# Patient Record
Sex: Male | Born: 1970 | Race: White | Hispanic: No | Marital: Married | Smoking: Never smoker
Health system: Southern US, Community
[De-identification: ages and names within clinical notes are randomized; demographics above are authoritative.]

## PROBLEM LIST (undated history)

## (undated) DIAGNOSIS — G5603 Carpal tunnel syndrome, bilateral upper limbs: Secondary | ICD-10-CM

## (undated) DIAGNOSIS — K0510 Chronic gingivitis, plaque induced: Secondary | ICD-10-CM

## (undated) DIAGNOSIS — R51 Headache: Secondary | ICD-10-CM

## (undated) DIAGNOSIS — R519 Headache, unspecified: Secondary | ICD-10-CM

## (undated) DIAGNOSIS — E119 Type 2 diabetes mellitus without complications: Secondary | ICD-10-CM

## (undated) HISTORY — DX: Headache: R51

## (undated) HISTORY — PX: MOUTH SURGERY: SHX715

## (undated) HISTORY — DX: Headache, unspecified: R51.9

## (undated) HISTORY — PX: VASECTOMY: SHX75

## (undated) HISTORY — PX: COLONOSCOPY WITH PROPOFOL: SHX5780

## (undated) HISTORY — DX: Type 2 diabetes mellitus without complications: E11.9

## (undated) HISTORY — DX: Chronic gingivitis, plaque induced: K05.10

## (undated) HISTORY — DX: Carpal tunnel syndrome, bilateral upper limbs: G56.03

---

## 2005-05-17 ENCOUNTER — Ambulatory Visit: Payer: Self-pay | Admitting: Family Medicine

## 2005-06-23 ENCOUNTER — Encounter: Payer: Self-pay | Admitting: Orthopedic Surgery

## 2006-10-28 ENCOUNTER — Ambulatory Visit: Payer: Self-pay | Admitting: Orthopaedic Surgery

## 2006-12-15 ENCOUNTER — Ambulatory Visit: Payer: Self-pay | Admitting: Gastroenterology

## 2016-01-27 ENCOUNTER — Other Ambulatory Visit: Payer: Self-pay | Admitting: Chiropractor

## 2016-01-27 DIAGNOSIS — M9901 Segmental and somatic dysfunction of cervical region: Secondary | ICD-10-CM

## 2016-01-27 DIAGNOSIS — M542 Cervicalgia: Secondary | ICD-10-CM

## 2016-01-27 DIAGNOSIS — M5413 Radiculopathy, cervicothoracic region: Secondary | ICD-10-CM

## 2016-01-27 DIAGNOSIS — G540 Brachial plexus disorders: Secondary | ICD-10-CM

## 2016-01-27 DIAGNOSIS — S161XXS Strain of muscle, fascia and tendon at neck level, sequela: Secondary | ICD-10-CM

## 2016-02-03 ENCOUNTER — Ambulatory Visit
Admission: RE | Admit: 2016-02-03 | Discharge: 2016-02-03 | Disposition: A | Discharge status: Home / Self Care | Payer: Managed Care, Other (non HMO) | Source: Ambulatory Visit | Attending: Chiropractor | Admitting: Chiropractor

## 2016-02-03 DIAGNOSIS — M9901 Segmental and somatic dysfunction of cervical region: Secondary | ICD-10-CM

## 2016-02-03 DIAGNOSIS — M5413 Radiculopathy, cervicothoracic region: Secondary | ICD-10-CM

## 2016-02-03 DIAGNOSIS — M542 Cervicalgia: Secondary | ICD-10-CM

## 2016-02-03 DIAGNOSIS — G540 Brachial plexus disorders: Secondary | ICD-10-CM

## 2016-02-03 DIAGNOSIS — S161XXS Strain of muscle, fascia and tendon at neck level, sequela: Secondary | ICD-10-CM

## 2016-02-03 MED ORDER — GADOBENATE DIMEGLUMINE 529 MG/ML IV SOLN: 20.0000 mL | Freq: Once | INTRAVENOUS | Status: DC | PRN

## 2016-03-10 ENCOUNTER — Ambulatory Visit (INDEPENDENT_AMBULATORY_CARE_PROVIDER_SITE_OTHER): Payer: Managed Care, Other (non HMO) | Admitting: Neurology

## 2016-03-10 ENCOUNTER — Telehealth: Payer: Self-pay | Admitting: Neurology

## 2016-03-10 ENCOUNTER — Encounter: Payer: Self-pay | Admitting: Neurology

## 2016-03-10 VITALS — BP 137/83 | HR 89 | Ht 72.0 in | Wt 229.0 lb

## 2016-03-10 DIAGNOSIS — G8929 Other chronic pain: Secondary | ICD-10-CM

## 2016-03-10 DIAGNOSIS — R202 Paresthesia of skin: Secondary | ICD-10-CM | POA: Diagnosis not present

## 2016-03-10 DIAGNOSIS — M25511 Pain in right shoulder: Principal | ICD-10-CM

## 2016-03-10 MED ORDER — NORTRIPTYLINE HCL 10 MG PO CAPS: ORAL_CAPSULE | ORAL | 3 refills | Status: DC
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
## 2016-03-10 NOTE — Telephone Encounter (Signed)
I will make the referral for physical therapy to [facility].

## 2016-03-10 NOTE — Telephone Encounter (Signed)
Pt wants rehab in [place]

## 2016-03-10 NOTE — Patient Instructions (Signed)
   We will start physical therapy on the right shoulder and start nortriptyline as a muscle relaxant. We will get EMG and NCV evaluation to look at the nerve function of the right arm.  Pamelor (nortriptyline) is an antidepressant medication that has many uses that may include headache, whiplash injuries, or for peripheral neuropathy pain. Side effects may include drowsiness, dry mouth, blurred vision, or constipation. As with any antidepressant medication, worsening depression may occur. If you had any significant side effects, please call our office. The full effects of this medication may take 7-10 days after starting the drug, or going up on the dose.

## 2016-04-06 ENCOUNTER — Ambulatory Visit: Payer: Managed Care, Other (non HMO) | Admitting: Physical Therapy

## 2016-04-08 ENCOUNTER — Ambulatory Visit: Payer: Managed Care, Other (non HMO) | Admitting: Physical Therapy

## 2016-04-12 ENCOUNTER — Encounter: Payer: Managed Care, Other (non HMO) | Admitting: Physical Therapy

## 2016-04-15 ENCOUNTER — Encounter: Payer: Managed Care, Other (non HMO) | Admitting: Physical Therapy

## 2016-04-19 ENCOUNTER — Encounter: Payer: Managed Care, Other (non HMO) | Admitting: Physical Therapy

## 2016-04-19 ENCOUNTER — Telehealth: Payer: Self-pay | Admitting: *Deleted
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
## 2016-04-20 ENCOUNTER — Encounter: Payer: Managed Care, Other (non HMO) | Admitting: Neurology

## 2016-04-26 ENCOUNTER — Ambulatory Visit: Payer: Managed Care, Other (non HMO) | Attending: Neurology | Admitting: Physical Therapy

## 2016-04-29 ENCOUNTER — Ambulatory Visit: Payer: Managed Care, Other (non HMO) | Admitting: Physical Therapy

## 2016-05-03 ENCOUNTER — Encounter: Payer: Managed Care, Other (non HMO) | Admitting: Physical Therapy

## 2016-05-06 ENCOUNTER — Encounter: Payer: Managed Care, Other (non HMO) | Admitting: Physical Therapy

## 2016-05-18 ENCOUNTER — Encounter (INDEPENDENT_AMBULATORY_CARE_PROVIDER_SITE_OTHER): Payer: Self-pay

## 2016-05-18 ENCOUNTER — Ambulatory Visit (INDEPENDENT_AMBULATORY_CARE_PROVIDER_SITE_OTHER): Payer: Self-pay | Admitting: Neurology

## 2016-05-18 ENCOUNTER — Ambulatory Visit (INDEPENDENT_AMBULATORY_CARE_PROVIDER_SITE_OTHER): Payer: 59 | Admitting: Neurology

## 2016-05-18 ENCOUNTER — Encounter: Payer: Self-pay | Admitting: Neurology

## 2016-05-18 DIAGNOSIS — R202 Paresthesia of skin: Secondary | ICD-10-CM

## 2016-05-18 DIAGNOSIS — M25511 Pain in right shoulder: Secondary | ICD-10-CM

## 2016-05-18 DIAGNOSIS — G8929 Other chronic pain: Secondary | ICD-10-CM

## 2016-05-18 DIAGNOSIS — G5603 Carpal tunnel syndrome, bilateral upper limbs: Secondary | ICD-10-CM

## 2016-05-18 HISTORY — DX: Carpal tunnel syndrome, bilateral upper limbs: G56.03

## 2016-05-18 NOTE — Progress Notes (Signed)
Please refer to EMG and nerve conduction study procedure note. 

## 2016-05-18 NOTE — Progress Notes (Signed)
The patient comes in today for EMG nerve conduction study evaluation which shows evidence of bilateral mild carpal tunnel syndrome. EMG on the right arm does not show evidence of a cervical radiculopathy.  The patient indicates that the muscle back since do help the pain, he sleeps better. He has not gotten into physical therapy has an appointment was never set up.  Physical therapy for [facility] was re-referred.  The patient will follow-up in 3-4 months. He will use wrist splints for the carpal tunnel syndrome bilaterally. Most of his discomfort is in the interscapular area on the right.
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
## 2016-08-06 ENCOUNTER — Other Ambulatory Visit: Payer: Self-pay | Admitting: Neurology

## 2017-03-04 DIAGNOSIS — K136 Irritative hyperplasia of oral mucosa: Secondary | ICD-10-CM | POA: Diagnosis not present

## 2017-11-17 ENCOUNTER — Emergency Department: Payer: 59

## 2017-11-17 ENCOUNTER — Emergency Department
Admission: EM | Admit: 2017-11-17 | Discharge: 2017-11-17 | Disposition: A | Discharge status: Home / Self Care | Payer: 59 | Attending: Emergency Medicine | Admitting: Emergency Medicine

## 2017-11-17 DIAGNOSIS — E119 Type 2 diabetes mellitus without complications: Secondary | ICD-10-CM | POA: Diagnosis not present

## 2017-11-17 DIAGNOSIS — Z79899 Other long term (current) drug therapy: Secondary | ICD-10-CM | POA: Insufficient documentation

## 2017-11-17 DIAGNOSIS — R51 Headache: Secondary | ICD-10-CM | POA: Diagnosis not present

## 2017-11-17 DIAGNOSIS — G43801 Other migraine, not intractable, with status migrainosus: Secondary | ICD-10-CM | POA: Diagnosis not present

## 2017-11-17 LAB — BASIC METABOLIC PANEL
ANION GAP: 12 (ref 5–15)
BUN: 11 mg/dL (ref 6–20)
CHLORIDE: 102 mmol/L (ref 98–111)
CO2: 25 mmol/L (ref 22–32)
CREATININE: 0.58 mg/dL — AB (ref 0.61–1.24)
Calcium: 9.3 mg/dL (ref 8.9–10.3)
GFR calc Af Amer: >60 mL/min (ref >60)
GFR calc non Af Amer: >60 mL/min (ref >60)
Glucose, Bld: 334 mg/dL — ABNORMAL HIGH (ref 70–99)
Potassium: 4.7 mmol/L (ref 3.5–5.1)
SODIUM: 139 mmol/L (ref 135–145)

## 2017-11-17 LAB — CBC WITH DIFFERENTIAL/PLATELET
ABS IMMATURE GRANULOCYTES: 0.01 10*3/uL (ref 0.00–0.07)
Basophils Absolute: 0 10*3/uL (ref 0.0–0.1)
Basophils Relative: 0 %
Eosinophils Absolute: 0.1 10*3/uL (ref 0.0–0.5)
Eosinophils Relative: 1 %
HEMATOCRIT: 51 % (ref 39.0–52.0)
Hemoglobin: 17.1 g/dL — ABNORMAL HIGH (ref 13.0–17.0)
Immature Granulocytes: 0 %
Lymphocytes Relative: 29 %
Lymphs Abs: 2.1 10*3/uL (ref 0.7–4.0)
MCH: 28.6 pg (ref 26.0–34.0)
MCHC: 33.5 g/dL (ref 30.0–36.0)
MCV: 85.4 fL (ref 80.0–100.0)
MONO ABS: 0.4 10*3/uL (ref 0.1–1.0)
Monocytes Relative: 6 %
NEUTROS ABS: 4.4 10*3/uL (ref 1.7–7.7)
NEUTROS PCT: 64 %
Platelets: 229 10*3/uL (ref 150–400)
RBC: 5.97 MIL/uL — ABNORMAL HIGH (ref 4.22–5.81)
RDW: 11.9 % (ref 11.5–15.5)
WBC: 7 10*3/uL (ref 4.0–10.5)
nRBC: 0 % (ref 0.0–0.2)

## 2017-11-17 MED ORDER — KETOROLAC TROMETHAMINE 30 MG/ML IJ SOLN
15.0000 mg | INTRAMUSCULAR | Status: AC
  Administered 2017-11-17: 15 mg via INTRAVENOUS
  Filled 2017-11-17: qty 1

## 2017-11-17 MED ORDER — DIPHENHYDRAMINE HCL 25 MG PO CAPS: 50.0000 mg | ORAL_CAPSULE | Freq: Four times a day (QID) | ORAL | 0 refills | Status: DC | PRN

## 2017-11-17 MED ORDER — SODIUM CHLORIDE 0.9 % IV BOLUS
1000.0000 mL | Freq: Once | INTRAVENOUS | Status: AC
  Administered 2017-11-17: 1000 mL via INTRAVENOUS

## 2017-11-17 MED ORDER — METOCLOPRAMIDE HCL 10 MG PO TABS: 10.0000 mg | ORAL_TABLET | Freq: Four times a day (QID) | ORAL | 0 refills | Status: DC | PRN

## 2017-11-17 MED ORDER — METOCLOPRAMIDE HCL 5 MG/ML IJ SOLN
10.0000 mg | Freq: Once | INTRAMUSCULAR | Status: AC
  Administered 2017-11-17: 10 mg via INTRAVENOUS
  Filled 2017-11-17: qty 2

## 2017-11-17 MED ORDER — KETOROLAC TROMETHAMINE 10 MG PO TABS: 10.0000 mg | ORAL_TABLET | Freq: Four times a day (QID) | ORAL | 0 refills | Status: DC | PRN

## 2017-11-17 MED ORDER — DIPHENHYDRAMINE HCL 50 MG/ML IJ SOLN
50.0000 mg | Freq: Once | INTRAMUSCULAR | Status: AC
  Administered 2017-11-17: 50 mg via INTRAVENOUS
  Filled 2017-11-17: qty 1

## 2017-11-17 NOTE — ED Provider Notes (Addendum)
[facility] Emergency Department Provider Note  ____________________________________________  Time seen: Approximately 10:55 AM  I have reviewed the triage vital signs and the nursing notes.   HISTORY  Chief Complaint Headache    HPI Bruce Brandt is a 47 y.o. male complains of headache which is severe, generalized.  Nonradiating, throbbing, associated light sensitivity.  No alleviating factors.  Not positional.  No recent trauma.  Reports that the headache started gradually after he has been taking decongestants for URI/sinus pressure over the past 2 weeks.  He has had multiple sick family members with similar symptoms.  He started having a bilateral frontal headache, and then over the past week this is generalized.  Rates it as severe.  Associated with vomiting as well.      Past Medical History:  Diagnosis Date  . Carpal tunnel syndrome, bilateral 05/18/2016  . Diabetes (HCC)   Diet-controlled diabetes   Patient Active Problem List   Diagnosis Date Noted  . Carpal tunnel syndrome, bilateral 05/18/2016  . Paresthesia 03/10/2016  . Chronic right shoulder pain 03/10/2016     Past Surgical History:  Procedure Laterality Date  . VASECTOMY       Prior to Admission medications   Medication Sig Start Date End Date Taking? Authorizing Provider  diphenhydrAMINE (BENADRYL) 25 mg capsule Take 2 capsules (50 mg total) by mouth every 6 (six) hours as needed. 11/17/17   Sharman Cheek, MD  ketorolac (TORADOL) 10 MG tablet Take 1 tablet (10 mg total) by mouth every 6 (six) hours as needed for moderate pain. 11/17/17   Sharman Cheek, MD  metoCLOPramide (REGLAN) 10 MG tablet Take 1 tablet (10 mg total) by mouth every 6 (six) hours as needed. 11/17/17   Sharman Cheek, MD  Multiple Vitamins-Minerals (MULTIVITAMIN ADULT PO) Take 1 tablet by mouth daily.    [provider]  Naproxen Sod-Diphenhydramine (ALEVE PM) 220-25 MG TABS Take 2 tablets by  mouth at bedtime as needed.    [provider]  nortriptyline (PAMELOR) 10 MG capsule Take 3 capsules (30 mg total) by mouth at bedtime. 08/06/16   York Spaniel, MD     Allergies Patient has no known allergies.   Family History  Problem Relation Age of Onset  . Heart disease Father   . Diabetes Maternal Grandmother   . Diabetes Paternal Grandmother     Social History Social History   Tobacco Use  . Smoking status: Never Smoker  . Smokeless tobacco: Never Used  Substance Use Topics  . Alcohol use: Yes    Comment: Occasional  . Drug use: No    Review of Systems  Constitutional:   No fever or chills.  ENT:   No sore throat.  Positive rhinorrhea and congestion Cardiovascular:   No chest pain or syncope. Respiratory:   No dyspnea or cough. Gastrointestinal:   Negative for abdominal pain, vomiting and diarrhea.  Musculoskeletal:   Negative for focal pain or swelling All other systems reviewed and are negative except as documented above in ROS and HPI.  ____________________________________________   PHYSICAL EXAM:  VITAL SIGNS: ED Triage Vitals  Enc Vitals Group     BP 11/17/17 0849 128/85     Pulse Rate 11/17/17 0849 88     Resp 11/17/17 0849 16     Temp 11/17/17 0849 98.8 F (37.1 C)     Temp Source 11/17/17 0849 Oral     SpO2 11/17/17 0849 95 %     Weight 11/17/17 0848  220 lb (99.8 kg)     Height 11/17/17 0848 6' (1.829 m)     Head Circumference --      Peak Flow --      Pain Score 11/17/17 0849 10     Pain Loc --      Pain Edu? --      Excl. in GC? --     Vital signs reviewed, nursing assessments reviewed.   Constitutional:   Alert and oriented. Non-toxic appearance. Eyes:   Conjunctivae are normal. EOMI. PERRL.  Appropriate concentric reflex ENT      Head:   Normocephalic and atraumatic.      Nose:   Nasal congestion without rhinorrhea.  No pain on percussion of sinuses      Mouth/Throat:   Dry mucous membranes, no pharyngeal erythema.  No peritonsillar mass.       Neck:   No meningismus. Full ROM. Hematological/Lymphatic/Immunilogical:   No cervical lymphadenopathy. Cardiovascular:   RRR. Symmetric bilateral radial and DP pulses.  No murmurs. Cap refill less than 2 seconds. Respiratory:   Normal respiratory effort without tachypnea/retractions. Breath sounds are clear and equal bilaterally. No wheezes/rales/rhonchi. Gastrointestinal:   Soft and nontender. Non distended. There is no CVA tenderness.  No rebound, rigidity, or guarding. Musculoskeletal:   Normal range of motion in all extremities. No joint effusions.  No lower extremity tenderness.  No edema. Neurologic:   Normal speech and language.  Motor grossly intact. No acute focal neurologic deficits are appreciated.  Skin:    Skin is warm, dry and intact. No rash noted.  No petechiae, purpura, or bullae.  ____________________________________________    LABS (pertinent positives/negatives) (all labs ordered are listed, but only abnormal results are displayed) Labs Reviewed  BASIC METABOLIC PANEL - Abnormal; Notable for the following components:      Result Value   Glucose, Bld 334 (*)    Creatinine, Ser 0.58 (*)    All other components within normal limits  CBC WITH DIFFERENTIAL/PLATELET - Abnormal; Notable for the following components:   RBC 5.97 (*)    Hemoglobin 17.1 (*)    All other components within normal limits   ____________________________________________   EKG    ____________________________________________    RADIOLOGY  Ct Head Wo Contrast  Result Date: 11/17/2017 CLINICAL DATA:  Worst headache of life starting yesterday EXAM: CT HEAD WITHOUT CONTRAST TECHNIQUE: Contiguous axial images were obtained from the base of the skull through the vertex without intravenous contrast. COMPARISON:  None. FINDINGS: Brain: No evidence of acute infarction, hemorrhage, hydrocephalus, extra-axial collection or mass lesion/mass effect. Vascular: No  hyperdense vessel or unexpected calcification. Skull: Normal. Negative for fracture or focal lesion. Sinuses/Orbits: No acute finding. IMPRESSION: Negative head CT. Electronically Signed   By: Marnee Spring M.D.   On: 11/17/2017 10:15    ____________________________________________   PROCEDURES Procedures  ____________________________________________  DIFFERENTIAL DIAGNOSIS   Intracranial mass, dehydration, migraine, viral syndrome, tension headache  CLINICAL IMPRESSION / ASSESSMENT AND PLAN / ED COURSE  Pertinent labs & imaging results that were available during my care of the patient were reviewed by me and considered in my medical decision making (see chart for details).      Clinical Course as of Nov 27 1522  Thu Nov 17, 2017  1054 Patient with subacute headache, gradual onset but severe.  Clinically consistent with migraine but no prior history.  Given migraine cocktail and IV fluids.  Labs do suggest hemoconcentration and dehydration.  CT head without contrast is negative.  I doubt cerebral aneurysm or intracranial hemorrhage.  No evidence of meningitis encephalitis glaucoma or arterial dissection or occlusion.  Doubt ischemic stroke.  If he is feeling better think is stable for discharge home and outpatient follow-up.   [PS]  1306 Patient feels slightly better, still with significant headache and photophobia.  Offered additional treatment here in the ED and medications to control his symptoms but patient declines and wishes to go home now..  Vital signs remain normal.  Stable for discharge home.   [PS]    Clinical Course User Index [PS] Sharman Cheek, MD     ____________________________________________   FINAL CLINICAL IMPRESSION(S) / ED DIAGNOSES    Final diagnoses:  Other migraine with status migrainosus, not intractable     ED Discharge Orders         Ordered    ketorolac (TORADOL) 10 MG tablet  Every 6 hours PRN     11/17/17 1315    metoCLOPramide  (REGLAN) 10 MG tablet  Every 6 hours PRN     11/17/17 1315    diphenhydrAMINE (BENADRYL) 25 mg capsule  Every 6 hours PRN     11/17/17 1315          Portions of this note were generated with dragon dictation software. Dictation errors may occur despite best attempts at proofreading.    Sharman Cheek, MD 11/17/17 1316    Sharman Cheek, MD 11/26/17 1524

## 2017-11-17 NOTE — ED Notes (Signed)
Pt given water 

## 2017-11-17 NOTE — Discharge Instructions (Signed)
Your CT scan of the head and lab tests today were okay.  Continue taking medications as needed to control your headache and follow-up with primary care for continued monitoring of your symptoms.

## 2017-11-17 NOTE — ED Triage Notes (Signed)
Pt reports worst headache of his life. Started yesterday in back of head. Reports sensitivity to light. No history of migraines

## 2017-11-17 NOTE — ED Notes (Signed)
Family to bedside, asking for update, Scotty Court MD aware

## 2017-12-02 DIAGNOSIS — Z049 Encounter for examination and observation for unspecified reason: Secondary | ICD-10-CM | POA: Diagnosis not present

## 2017-12-02 DIAGNOSIS — G4452 New daily persistent headache (NDPH): Secondary | ICD-10-CM | POA: Diagnosis not present

## 2017-12-02 DIAGNOSIS — R51 Headache: Secondary | ICD-10-CM | POA: Diagnosis not present

## 2017-12-02 DIAGNOSIS — Z79899 Other long term (current) drug therapy: Secondary | ICD-10-CM | POA: Diagnosis not present

## 2017-12-03 ENCOUNTER — Other Ambulatory Visit: Payer: Self-pay | Admitting: Specialist

## 2017-12-03 DIAGNOSIS — R51 Headache: Principal | ICD-10-CM

## 2017-12-03 DIAGNOSIS — R519 Headache, unspecified: Secondary | ICD-10-CM

## 2017-12-06 DIAGNOSIS — G4452 New daily persistent headache (NDPH): Secondary | ICD-10-CM | POA: Diagnosis not present

## 2017-12-07 ENCOUNTER — Ambulatory Visit
Admission: RE | Admit: 2017-12-07 | Discharge: 2017-12-07 | Disposition: A | Discharge status: Home / Self Care | Payer: 59 | Source: Ambulatory Visit | Attending: Specialist | Admitting: Specialist

## 2017-12-07 DIAGNOSIS — R519 Headache, unspecified: Secondary | ICD-10-CM

## 2017-12-07 DIAGNOSIS — R51 Headache: Principal | ICD-10-CM

## 2017-12-13 DIAGNOSIS — R51 Headache: Secondary | ICD-10-CM | POA: Diagnosis not present

## 2017-12-28 DIAGNOSIS — G4452 New daily persistent headache (NDPH): Secondary | ICD-10-CM | POA: Diagnosis not present

## 2017-12-29 ENCOUNTER — Ambulatory Visit: Payer: Self-pay | Admitting: Nurse Practitioner

## 2018-01-03 ENCOUNTER — Other Ambulatory Visit: Payer: Self-pay

## 2018-01-03 ENCOUNTER — Ambulatory Visit (INDEPENDENT_AMBULATORY_CARE_PROVIDER_SITE_OTHER): Payer: 59 | Admitting: Nurse Practitioner

## 2018-01-03 ENCOUNTER — Encounter: Payer: Self-pay | Admitting: Nurse Practitioner

## 2018-01-03 VITALS — BP 111/76 | HR 88 | Temp 98.0°F | Ht 72.0 in | Wt 225.8 lb

## 2018-01-03 DIAGNOSIS — E1142 Type 2 diabetes mellitus with diabetic polyneuropathy: Secondary | ICD-10-CM | POA: Insufficient documentation

## 2018-01-03 DIAGNOSIS — E1165 Type 2 diabetes mellitus with hyperglycemia: Secondary | ICD-10-CM | POA: Insufficient documentation

## 2018-01-03 DIAGNOSIS — Z23 Encounter for immunization: Secondary | ICD-10-CM

## 2018-01-03 DIAGNOSIS — Z7689 Persons encountering health services in other specified circumstances: Secondary | ICD-10-CM

## 2018-01-03 DIAGNOSIS — R519 Headache, unspecified: Secondary | ICD-10-CM | POA: Insufficient documentation

## 2018-01-03 DIAGNOSIS — R51 Headache: Secondary | ICD-10-CM

## 2018-01-03 LAB — POCT GLYCOSYLATED HEMOGLOBIN (HGB A1C): Hemoglobin A1C: 13.4 % — AB (ref 4.0–5.6)

## 2018-01-03 MED ORDER — METFORMIN HCL 500 MG PO TABS: 500.0000 mg | ORAL_TABLET | Freq: Two times a day (BID) | ORAL | 0 refills | Status: DC

## 2018-01-03 NOTE — Progress Notes (Signed)
Subjective:    Patient ID: Bruce Brandt, male    DOB: [birth year], 47 y.o.   MRN: [id number]  Bruce LoboBobby L Naim is a 47 y.o. male presenting on 01/03/2018 for Establish Care (persistent headache x 8 weeks. [facility] w/ no relief) and Diabetes   HPI Establish Care New Provider Pt last seen by PCP Dr. Carman [facility]ingMoffet at [facility] at least 6-7 years ago.    Diabetes Was previously treated by Dr. Carman ChingMoffet and endocrinology has not had care since 2007.  Is not currently on any medications for DM.  He had lost about 100 lbs and has not recently had any followup. - Pt presents today for follow up of Type 2 diabetes mellitus. He is not checking CBG at home - Current diabetic medications include: none - he self-stopped his medications several years ago - He is symptomatic with headache, polyuria, polyphagia, paresthesias (BLE pain at night).  - He denies polydipsia, diaphoresis, shakiness, chills and changes in vision.   - Clinical course has been unmanaged. - He  reports an exercise routine that includes work as a Surveyor, mineralscontractor, 5 days per week. - His diet is moderate in salt, high in fat, and high in carbohydrates. - Weight trend: stable (is down 75-100 lbs from prior high weight).  Recent Labs    01/03/18 0853  HGBA1C 13.4*     Persistent headache Patient has had workup with headache center and ENT without relief. Most recently had migraine on 11/17/2017 that required emergency room visit for treatment. CT scan was negative, IV toradol without significant relief.  "Constant headache."  MRI and blood tests at [facility] [place] [address] without any answers.  No sinus issues ID'd with ENT.  Headache specialist ordered gabapentin, but patient has not started yet.  States he wants fewest meds possible.  Past Medical History:  Diagnosis Date  . Carpal tunnel syndrome, bilateral 05/18/2016  . Diabetes (HCC)   . Frequent headaches   . Gingivitis     Past Surgical History:  Procedure Laterality Date  . MOUTH SURGERY     gingival repair  . VASECTOMY     Social History   Socioeconomic History  . Marital status: Married    Spouse name: Not on file  . Number of children: 2  . Years of education: 4712  . Highest education level: High school graduate  Occupational History  . Occupation: Music therapistCarpenter    Comment: Self-employed  Social Needs  . Financial resource strain: Not hard at all  . Food insecurity:    Worry: Never true    Inability: Never true  . Transportation needs:    Medical: No    Non-medical: No  Tobacco Use  . Smoking status: Never Smoker  . Smokeless tobacco: Never Used  Substance and Sexual Activity  . Alcohol use: Not Currently    Frequency: Never    Comment: > 2 years ago had rare alcohol intake  . Drug use: No  . Sexual activity: Yes    Partners: Female    Comment: Married  Lifestyle  . Physical activity:    Days per week: 5 days    Minutes per session: 50 min  . Stress: Only a little  Relationships  . Social connections:    Talks on phone: Twice a week    Gets together: Twice a week    Attends religious service: More than 4 times per year    Active member  of club or organization: Yes    Attends meetings of clubs or organizations: More than 4 times per year    Relationship status: Married  . Intimate partner violence:    Fear of current or ex partner: No    Emotionally abused: No    Physically abused: No    Forced sexual activity: No  Other Topics Concern  . Not on file  Social History Narrative   Lives at home w/ his wife   Right-handed   Caffeine: three 16 oz drinks daily   Family History  Problem Relation Age of Onset  . Heart disease Father   . Diabetes Maternal Grandmother   . Diabetes Paternal Grandmother    Current Outpatient Medications on File Prior to Visit  Medication Sig  . aspirin EC 81 MG tablet Take 81 mg by mouth daily.  . Cinnamon 500 MG capsule Take 500 mg by mouth  daily.  . Misc Natural Products (PROSTATE HEALTH PO) Take by mouth.  . Multiple Vitamins-Minerals (MULTIVITAMIN ADULT PO) Take 1 tablet by mouth daily.  . Nutritional Supplements (JUICE PLUS FIBRE) LIQD Take by mouth.  . gabapentin (NEURONTIN) 300 MG capsule   . Naproxen Sod-Diphenhydramine (ALEVE PM) 220-25 MG TABS Take 2 tablets by mouth at bedtime as needed.   No current facility-administered medications on file prior to visit.     Review of Systems  Constitutional: Negative for activity change, appetite change, fatigue and unexpected weight change.  HENT: Negative for congestion, hearing loss and trouble swallowing.   Eyes: Negative for visual disturbance.  Respiratory: Negative for choking, shortness of breath and wheezing.   Cardiovascular: Negative for chest pain and palpitations.  Gastrointestinal: Negative for abdominal pain, blood in stool, constipation and diarrhea.  Genitourinary: Positive for frequency. Negative for difficulty urinating.       Erectile dysfunction  Musculoskeletal: Negative for arthralgias, back pain and myalgias.       Neuropathies  Skin: Negative for color change, rash and wound.  Allergic/Immunologic: Negative for environmental allergies.  Neurological: Positive for headaches. Negative for dizziness, seizures and weakness.  Psychiatric/Behavioral: Negative for behavioral problems, decreased concentration, dysphoric mood, sleep disturbance and suicidal ideas. The patient is not nervous/anxious.    Per HPI unless specifically indicated above     Objective:    BP 111/76 (BP Location: Right Arm, Patient Position: Sitting, Cuff Size: Normal)   Pulse 88   Temp 98 F (36.7 C) (Oral)   Ht 6' (1.829 m)   Wt 225 lb 12.8 oz (102.4 kg)   BMI 30.62 kg/m   Wt Readings from Last 3 Encounters:  01/03/18 225 lb 12.8 oz (102.4 kg)  11/17/17 220 lb (99.8 kg)  03/10/16 229 lb (103.9 kg)    Physical Exam  Constitutional: He is oriented to person, place, and  time. He appears well-developed and well-nourished. No distress.  HENT:  Head: Normocephalic and atraumatic.  Neck: Normal range of motion. Neck supple. Carotid bruit is not present.  Cardiovascular: Normal rate, regular rhythm, S1 normal, S2 normal, normal heart sounds and intact distal pulses.  Pulmonary/Chest: Effort normal and breath sounds normal. No respiratory distress.  Abdominal: Soft. Bowel sounds are normal. He exhibits no distension. There is no hepatosplenomegaly. There is no tenderness. No hernia.  Musculoskeletal: He exhibits no edema (pedal).  Neurological: He is alert and oriented to person, place, and time.  Skin: Skin is warm and dry. Capillary refill takes less than 2 seconds.  Psychiatric: He has a normal mood and  affect. His behavior is normal. Judgment and thought content normal.  Vitals reviewed.  Results for orders placed or performed in visit on 01/03/18  POCT glycosylated hemoglobin (Hb A1C)  Result Value Ref Range   Hemoglobin A1C 13.4 (A) 4.0 - 5.6 %   HbA1c POC (<> result, manual entry)     HbA1c, POC (prediabetic range)     HbA1c, POC (controlled diabetic range)        Assessment & Plan:   Problem List Items Addressed This Visit      Endocrine   Diabetic peripheral neuropathy associated with type 2 diabetes mellitus (HCC) Chronic, worsens at night.  Recommended patient start gabapentin as recommended by headache specialist for this problem as well.  Follow-up 6 weeks prn.   Relevant Medications   gabapentin (NEURONTIN) 300 MG capsule   aspirin EC 81 MG tablet   metFORMIN (GLUCOPHAGE) 500 MG tablet   Type 2 diabetes mellitus with hyperglycemia, without long-term current use of insulin (HCC) - Primary Uncontrolled DM with A1c > 13%  and goal A1c < 7.0%.  Patient endorses regular headache that may be 2/2 hyperglycemia, ED 2/2 vascular changes from DM. - Complications - peripheral neuropathy and hyperglycemia.  Plan:  1. START metformin 500 mg po daily  w breakfast for 7 days.  Then, increase to 500 mg po daily w breakfast and supper and continue. Likely will increase to 1,000 mg bid at next visit. - Prefer to start Ace/ARB and statin, but patient refuses at this time. - Also, patient likely to need 2nd agent in future for glycemic control - will consider SGLT2 inhibitor vs GLP1 2. Encourage improved lifestyle: - low carb/low glycemic diet handout provided - Increase physical activity to 30 minutes most days of the week.  Explained that increased physical activity increases body's use of sugar for energy.  Add activity after dinner for better postprandial control. 3. Check fasting am CBG and bring log to next visit for review. Patient has meter, needs strip refill.  Will call clinic for strip refill request with brand name. 4. Advised to schedule DM ophtho exam, send record.  5. Patient has adequate knowledge base, little activation today exhibited for changing course of care for DM management.  Educated patient today about risks of uncontrolled DM including, but not limited to MI, CVA, vision changes, kidney damage, wounds/amputation, vascular diseases.   6. Follow-up 6 weeks    Relevant Medications   aspirin EC 81 MG tablet   metFORMIN (GLUCOPHAGE) 500 MG tablet   Other Relevant Orders   POCT glycosylated hemoglobin (Hb A1C) (Completed)     Other   Persistent headaches Patient with extensive workup for persistent headache and no defined answers.  Was recommended to start gabapentin, but has not started yet.   - Encouraged patient to start gabapentin. Consider change to topiramate if not improved in future.   - Continue follow-up with Headache clinic.   Relevant Medications   gabapentin (NEURONTIN) 300 MG capsule   aspirin EC 81 MG tablet    Other Visit Diagnoses    Encounter to establish care     Previous PCP was at [facility] > 6 years ago.  Records will not be requested. Recent episodic care reviewed in [facility]. Past medical, family,  and surgical history reviewed w/ patient in clinic.     Needs flu shot     Pt < age 63.  Needs annual influenza vaccine.  Plan: 1. Administer Quad flu vaccine.    Relevant  Orders   Flu Vaccine QUAD 36+ mos IM      Meds ordered this encounter  Medications  . metFORMIN (GLUCOPHAGE) 500 MG tablet    Sig: Take 1 tablet (500 mg total) by mouth 2 (two) times daily with a meal.    Dispense:  180 tablet    Refill:  0    Order Specific Question:   Supervising Provider    Answer:   Smitty Cords [[id number]]    Follow up plan: Return in about 6 weeks (around 02/14/2018) for diabetes.  Wilhelmina Mcardle, DNP, AGPCNP-BC Adult Gerontology Primary Care Nurse Practitioner [facility] 01/03/2018, 12:07 PM

## 2018-01-03 NOTE — Patient Instructions (Addendum)
Bruce LoboBobby L Brandt,   Thank you for coming in to clinic today.  1. Metformin - START with 500 mg once daily for 2 weeks.  Then increase to 500 mg with breakfast and dinner and continue.  2. Work on low glycemic diet (handout).  Please schedule a follow-up appointment with Wilhelmina McardleLauren Mahira Petras, AGNP. Return in about 6 weeks (around 02/14/2018) for diabetes.  If you have any other questions or concerns, please feel free to call the clinic or send a message through MyChart. You may also schedule an earlier appointment if necessary.  You will receive a survey after today's visit either digitally by e-mail or paper by [employer] mail. Your experiences and feedback matter to us.  Please respond so we know how we are doing as we provide care for you.   Wilhelmina McardleLauren Jacorian Golaszewski, DNP, AGNP-BC Adult Gerontology Nurse Practitioner [facility]

## 2018-01-04 ENCOUNTER — Other Ambulatory Visit: Payer: Self-pay

## 2018-01-04 DIAGNOSIS — E1165 Type 2 diabetes mellitus with hyperglycemia: Secondary | ICD-10-CM

## 2018-01-04 MED ORDER — METFORMIN HCL 500 MG PO TABS: 500.0000 mg | ORAL_TABLET | Freq: Two times a day (BID) | ORAL | 0 refills | Status: DC

## 2018-02-09 ENCOUNTER — Ambulatory Visit: Payer: 59 | Admitting: Nurse Practitioner

## 2018-02-09 DIAGNOSIS — R51 Headache: Secondary | ICD-10-CM | POA: Diagnosis not present

## 2018-02-09 DIAGNOSIS — G4452 New daily persistent headache (NDPH): Secondary | ICD-10-CM | POA: Diagnosis not present

## 2018-02-09 NOTE — Progress Notes (Signed)
Patient came in today 02/09/18 for a scheduled appointment @ 9:20.  However, the appointment was canceled on 02/06/18 @ 4:14pm by patient per [facility].    I spoke with patient and applogize for the inconvenience and tried to explain to the patient our plan to see him today.  Patient got irate and stated"we owed him $25 dollars and that this is only the second time he has been at this office and this office is horrible."  I was trying to explain to the patient that I had to get approval to move a patient to a no show appointment on the other provider schedule and we would see him at 9:20.  However, the patient would not let me talk and continued raising his voice and he walked out.

## 2018-02-10 ENCOUNTER — Encounter: Payer: Self-pay | Admitting: Nurse Practitioner

## 2018-02-10 ENCOUNTER — Ambulatory Visit (INDEPENDENT_AMBULATORY_CARE_PROVIDER_SITE_OTHER): Payer: 59 | Admitting: Nurse Practitioner

## 2018-02-10 ENCOUNTER — Other Ambulatory Visit: Payer: Self-pay

## 2018-02-10 VITALS — BP 120/74 | HR 95 | Temp 98.5°F | Ht 72.0 in | Wt 222.8 lb

## 2018-02-10 DIAGNOSIS — E1165 Type 2 diabetes mellitus with hyperglycemia: Secondary | ICD-10-CM

## 2018-02-10 LAB — POCT GLYCOSYLATED HEMOGLOBIN (HGB A1C): Hemoglobin A1C: 13.5 % — AB (ref 4.0–5.6)

## 2018-02-10 MED ORDER — DAPAGLIFLOZIN PROPANEDIOL 10 MG PO TABS: 10.0000 mg | ORAL_TABLET | Freq: Every day | ORAL | 5 refills | Status: DC

## 2018-02-10 MED ORDER — METFORMIN HCL ER 500 MG PO TB24: 500.0000 mg | ORAL_TABLET | Freq: Two times a day (BID) | ORAL | 2 refills | Status: DC

## 2018-02-10 MED ORDER — EMPAGLIFLOZIN 25 MG PO TABS: 25.0000 mg | ORAL_TABLET | Freq: Every day | ORAL | 5 refills | Status: DC

## 2018-02-10 MED ORDER — FREESTYLE LIBRE SENSOR SYSTEM MISC: 1.0000 | Freq: Every day | 0 refills | Status: DC

## 2018-02-10 NOTE — Progress Notes (Signed)
Subjective:    Patient ID: Bruce Brandt, male    DOB: 08/11/1970, 48 y.o.   MRN: [id number]  Bruce Brandt is a 48 y.o. male presenting on 02/10/2018 for Diabetes (unable to check blood sugar, glucometer broken. Pt want to discuss getting  FreeStyle Libre sensors. )   HPI Diabetes Pt presents today for follow up of Type 2 diabetes mellitus.  He is not checking CBG at home. He states his old glucometer is not working.  He would like to have CGM if possible.   - Patient has no glucose log and agrees to early A1c testing today. - Current diabetic medications include: metformin - He is symptomatic with currently.  - He denies headaches, diaphoresis, shakiness, chills, pain, numbness or tingling in extremities and changes in vision.   - Clinical course has been unchanged. - Feels constipated with new gabapentin and metformin.  Not sure which med is causing symptoms. - Also feels "run down" more.  Discussed fatigue is side effect of gabapentin. - Only having one urination at night if not drinking milk.  Otherwise is 2-3 times like last visit. - He  reports no regular exercise routine. - His diet is moderate in salt, moderate in fat, and high in carbohydrates. - Weight trend: stable   PREVENTION: Eye exam current (within one year): no Foot exam current (within one year): no Lipid/ASCVD risk reduction - on statin: no -patient refuses Kidney protection - on ace or arb: no- patient refuses Recent Labs    01/03/18 0853 02/10/18 0957  HGBA1C 13.4* 13.5*    Typical diet choices: - Kind breakfast protein squares  - Nut bars (Kind bars) for snacks  - Lunch: He regularly has sandwich with 2 slices bread, and chips for lunch.  Also will have other carb snacks throughout the day and sometimes will have up to 5 carb servings for lunch.  Social History   Tobacco Use  . Smoking status: Never Smoker  . Smokeless tobacco: Never Used  Substance Use Topics  . Alcohol use: Not Currently   Frequency: Never    Comment: > 2 years ago had rare alcohol intake  . Drug use: No    Review of Systems Per HPI unless specifically indicated above     Objective:    BP 120/74 (BP Location: Left Arm, Patient Position: Sitting, Cuff Size: Large)   Pulse 95   Temp 98.5 F (36.9 C) (Oral)   Ht 6' (1.829 m)   Wt 222 lb 12.8 oz (101.1 kg)   BMI 30.22 kg/m   Wt Readings from Last 3 Encounters:  02/10/18 222 lb 12.8 oz (101.1 kg)  01/03/18 225 lb 12.8 oz (102.4 kg)  11/17/17 220 lb (99.8 kg)    Physical Exam Vitals signs reviewed.  Constitutional:      General: He is not in acute distress.    Appearance: He is well-developed.  HENT:     Head: Normocephalic and atraumatic.  Cardiovascular:     Rate and Rhythm: Normal rate and regular rhythm.     Pulses:          Radial pulses are 2+ on the right side and 2+ on the left side.       Posterior tibial pulses are 1+ on the right side and 1+ on the left side.     Heart sounds: Normal heart sounds, S1 normal and S2 normal.  Pulmonary:     Effort: Pulmonary effort is normal. No respiratory distress.  Breath sounds: Normal breath sounds and air entry.  Musculoskeletal:     Right lower leg: No edema.     Left lower leg: No edema.  Skin:    General: Skin is warm and dry.     Capillary Refill: Capillary refill takes less than 2 seconds.  Neurological:     Mental Status: He is alert and oriented to person, place, and time.  Psychiatric:        Attention and Perception: Attention normal.        Mood and Affect: Mood and affect normal.        Behavior: Behavior normal. Behavior is cooperative.        Thought Content: Thought content normal.        Judgment: Judgment normal.      Results for orders placed or performed in visit on 01/03/18  POCT glycosylated hemoglobin (Hb A1C)  Result Value Ref Range   Hemoglobin A1C 13.4 (A) 4.0 - 5.6 %   HbA1c POC (<> result, manual entry)     HbA1c, POC (prediabetic range)     HbA1c, POC  (controlled diabetic range)         Assessment & Plan:   Problem List Items Addressed This Visit      Endocrine   Type 2 diabetes mellitus with hyperglycemia, without long-term current use of insulin (HCC) - Primary    UncontrolledDM with A1c > 13% stable from last check 6 weeks ago and goal A1c < 8.0%. - Complications - peripheral neuropathy and hyperglycemia.  Plan:  1. Change therapy:  - Change to Metformin XR 500 mg bid - START Jardiance 25 mg once daily.  - may consider future basal insulin for patient if unable to impact blood sugar readings once checking regularly at home.  2. Encourage improved lifestyle: - low carb/low glycemic diet reinforced prior education - Increase physical activity to 30 minutes most days of the week.  Explained that increased physical activity increases body's use of sugar for energy. 3. Check fasting am CBG bring log to next visit for review 4. Will continue to encourage starting ACEi and Statin.  Patient refuses at this time. 5. Advised to schedule DM ophtho exam, send record. 6. Follow-up 6 weeks.  Consider endocrinology referral and repeat Lifestyle center classes for DM at next visit.  Patient declines classes at this time.       Relevant Medications   Continuous Blood Gluc Sensor (FREESTYLE LIBRE SENSOR SYSTEM) MISC   metFORMIN (GLUCOPHAGE XR) 500 MG 24 hr tablet   empagliflozin (JARDIANCE) 25 MG TABS tablet   glucose blood (FREESTYLE TEST STRIPS) test strip   Lancet Devices (LANCET DEVICE WITH EJECTOR) MISC   Other Relevant Orders   POCT glycosylated hemoglobin (Hb A1C) (Completed)      Meds ordered this encounter  Medications  . Continuous Blood Gluc Sensor (FREESTYLE LIBRE SENSOR SYSTEM) MISC    Sig: 1 Device by Does not apply route daily.    Dispense:  1 each    Refill:  0    Please provide 3 months #7 sensors    Order Specific Question:   Supervising Provider    Answer:   Smitty CordsKARAMALEGOS, ALEXANDER J [[id number]]  . metFORMIN (GLUCOPHAGE  XR) 500 MG 24 hr tablet    Sig: Take 1 tablet (500 mg total) by mouth 2 (two) times daily.    Dispense:  60 tablet    Refill:  2    Order Specific Question:   Supervising Provider  Answer:   Smitty CordsKARAMALEGOS, ALEXANDER J [[id number]]  . DISCONTD: dapagliflozin propanediol (FARXIGA) 10 MG TABS tablet    Sig: Take 10 mg by mouth daily.    Dispense:  30 tablet    Refill:  5    Order Specific Question:   Supervising Provider    Answer:   Smitty CordsKARAMALEGOS, ALEXANDER J [[id number]]  . empagliflozin (JARDIANCE) 25 MG TABS tablet    Sig: Take 25 mg by mouth daily.    Dispense:  30 tablet    Refill:  5    Order Specific Question:   Supervising Provider    Answer:   Smitty CordsKARAMALEGOS, ALEXANDER J [[id number]]  . glucose blood (FREESTYLE TEST STRIPS) test strip    Sig: Use as instructed to check blood sugar up to two times daily.    Dispense:  50 each    Refill:  12    Order Specific Question:   Supervising Provider    Answer:   Smitty CordsKARAMALEGOS, ALEXANDER J [[id number]]  . Lancet Devices (LANCET DEVICE WITH EJECTOR) MISC    Sig: 1 Device by Does not apply route once for 1 dose.    Dispense:  1 each    Refill:  0    Please also provide accompanying lancets #100 with 4 refills.    Order Specific Question:   Supervising Provider    Answer:   Smitty CordsKARAMALEGOS, ALEXANDER J [[id number]]    Follow up plan: Return in about 6 weeks (around 03/24/2018) for diabetes.  A total of  43 minutes was spent face-to-face with this patient. Greater than 50% of this time was spent in counseling and coordination of care with the patient for DM management, diet modification, medications as above in AP.    Wilhelmina McardleLauren Avier Jech, DNP, AGPCNP-BC Adult Gerontology Primary Care Nurse Practitioner [facility] 02/10/2018, 9:25 AM

## 2018-02-10 NOTE — Patient Instructions (Addendum)
Bruce Brandt,   Thank you for coming in to clinic today.  1. Continue working to reduce overall carbohydrate servings.  Aim for 1-3 per meal and no more than 7-8 per day.  2. CHANGE metformin to Metformin XR 500 mg twice daily.  This may help bloating,   3. START Farxiga 10 mg once daily in morning or lunch.  4. May consider long acting insulin for A1c goals at next visit.  5. Check morning blood sugars (if standard glucose meter) and or keep your Freestyle log - Write down morning and before all meals and at bedtime.   - After meals: 2 hours after - should within 50 points or less of your pre-meal sugar. - Right now goals are to see about 20-50 points improvement on average in first 1-2 weeks for fasting am sugars.  Please schedule a follow-up appointment with Wilhelmina Mcardle, AGNP. Return in about 6 weeks (around 03/24/2018) for diabetes.  If you have any other questions or concerns, please feel free to call the clinic or send a message through MyChart. You may also schedule an earlier appointment if necessary.  You will receive a survey after today's visit either digitally by e-mail or paper by [employer]. Your experiences and feedback matter to [place].  Please respond so we know how we are doing as we provide care for you.   Wilhelmina Mcardle, DNP, AGNP-BC Adult Gerontology Nurse Practitioner [facility], [facility]   Carbohydrate Counting for Diabetes Mellitus, Adult  Carbohydrate counting is a method of keeping track of how many carbohydrates you eat. Eating carbohydrates naturally increases the amount of sugar (glucose) in the blood. Counting how many carbohydrates you eat helps keep your blood glucose within normal limits, which helps you manage your diabetes (diabetes mellitus). It is important to know how many carbohydrates you can safely have in each meal. This is different for every person. A diet and nutrition specialist (registered dietitian) can help you make a meal  plan and calculate how many carbohydrates you should have at each meal and snack. Carbohydrates are found in the following foods:  Grains, such as breads and cereals.  Dried beans and soy products.  Starchy vegetables, such as potatoes, peas, and corn.  Fruit and fruit juices.  Milk and yogurt.  Sweets and snack foods, such as cake, cookies, candy, chips, and soft drinks. How do I count carbohydrates? There are two ways to count carbohydrates in food. You can use either of the methods or a combination of both. Reading "Nutrition Facts" on packaged food The "Nutrition Facts" list is included on the labels of almost all packaged foods and beverages in the [place] It includes:  The serving size.  Information about nutrients in each serving, including the grams (g) of carbohydrate per serving. To use the "Nutrition Facts":  Decide how many servings you will have.  Multiply the number of servings by the number of carbohydrates per serving.  The resulting number is the total amount of carbohydrates that you will be having. Learning standard serving sizes of other foods When you eat carbohydrate foods that are not packaged or do not include "Nutrition Facts" on the label, you need to measure the servings in order to count the amount of carbohydrates:  Measure the foods that you will eat with a food scale or measuring cup, if needed.  Decide how many standard-size servings you will eat.  Multiply the number of servings by 15. Most carbohydrate-rich foods have about 15  g of carbohydrates per serving. ? For example, if you eat 8 oz (170 g) of strawberries, you will have eaten 2 servings and 30 g of carbohydrates (2 servings x 15 g = 30 g).  For foods that have more than one food mixed, such as soups and casseroles, you must count the carbohydrates in each food that is included. The following list contains standard serving sizes of common carbohydrate-rich foods. Each of these servings has  about 15 g of carbohydrates:   hamburger bun or  English muffin.   oz (15 mL) syrup.   oz (14 g) jelly.  1 slice of bread.  1 six-inch tortilla.  3 oz (85 g) cooked rice or pasta.  4 oz (113 g) cooked dried beans.  4 oz (113 g) starchy vegetable, such as peas, corn, or potatoes.  4 oz (113 g) hot cereal.  4 oz (113 g) mashed potatoes or  of a large baked potato.  4 oz (113 g) canned or frozen fruit.  4 oz (120 mL) fruit juice.  4-6 crackers.  6 chicken nuggets.  6 oz (170 g) unsweetened dry cereal.  6 oz (170 g) plain fat-free yogurt or yogurt sweetened with artificial sweeteners.  8 oz (240 mL) milk.  8 oz (170 g) fresh fruit or one small piece of fruit.  24 oz (680 g) popped popcorn. Example of carbohydrate counting Sample meal  3 oz (85 g) chicken breast.  6 oz (170 g) brown rice.  4 oz (113 g) corn.  8 oz (240 mL) milk.  8 oz (170 g) strawberries with sugar-free whipped topping. Carbohydrate calculation 1. Identify the foods that contain carbohydrates: ? Rice. ? Corn. ? Milk. ? Strawberries. 2. Calculate how many servings you have of each food: ? 2 servings rice. ? 1 serving corn. ? 1 serving milk. ? 1 serving strawberries. 3. Multiply each number of servings by 15 g: ? 2 servings rice x 15 g = 30 g. ? 1 serving corn x 15 g = 15 g. ? 1 serving milk x 15 g = 15 g. ? 1 serving strawberries x 15 g = 15 g. 4. Add together all of the amounts to find the total grams of carbohydrates eaten: ? 30 g + 15 g + 15 g + 15 g = 75 g of carbohydrates total. Summary  Carbohydrate counting is a method of keeping track of how many carbohydrates you eat.  Eating carbohydrates naturally increases the amount of sugar (glucose) in the blood.  Counting how many carbohydrates you eat helps keep your blood glucose within normal limits, which helps you manage your diabetes.  A diet and nutrition specialist (registered dietitian) can help you make a meal plan  and calculate how many carbohydrates you should have at each meal and snack. This information is not intended to replace advice given to you by your health care provider. Make sure you discuss any questions you have with your health care provider. Document Released: 01/25/2005 Document Revised: 08/04/2016 Document Reviewed: 07/09/2015 Elsevier Interactive Patient Education  2019 [employer].

## 2018-02-13 ENCOUNTER — Encounter: Payer: Self-pay | Admitting: Nurse Practitioner

## 2018-02-13 MED ORDER — GLUCOSE BLOOD VI STRP: ORAL_STRIP | 12 refills | Status: DC

## 2018-02-13 MED ORDER — LANCET DEVICE WITH EJECTOR MISC: 1.0000 | Freq: Once | 0 refills | Status: AC

## 2018-02-13 NOTE — Assessment & Plan Note (Signed)
UncontrolledDM with A1c > 13% stable from last check 6 weeks ago and goal A1c < 8.0%. - Complications - peripheral neuropathy and hyperglycemia.  Plan:  1. Change therapy:  - Change to Metformin XR 500 mg bid - START Jardiance 25 mg once daily.  - may consider future basal insulin for patient if unable to impact blood sugar readings once checking regularly at home.  2. Encourage improved lifestyle: - low carb/low glycemic diet reinforced prior education - Increase physical activity to 30 minutes most days of the week.  Explained that increased physical activity increases body's use of sugar for energy. 3. Check fasting am CBG bring log to next visit for review 4. Will continue to encourage starting ACEi and Statin.  Patient refuses at this time. 5. Advised to schedule DM ophtho exam, send record. 6. Follow-up 6 weeks.  Consider endocrinology referral and repeat [facility] classes for DM at next visit.  Patient declines classes at this time.

## 2018-03-23 DIAGNOSIS — G4452 New daily persistent headache (NDPH): Secondary | ICD-10-CM | POA: Diagnosis not present

## 2018-03-24 ENCOUNTER — Encounter: Payer: Self-pay | Admitting: Nurse Practitioner

## 2018-03-24 ENCOUNTER — Other Ambulatory Visit: Payer: Self-pay

## 2018-03-24 ENCOUNTER — Ambulatory Visit: Payer: 59 | Admitting: Nurse Practitioner

## 2018-03-24 VITALS — BP 117/66 | HR 85 | Temp 98.6°F | Ht 72.0 in | Wt 224.0 lb

## 2018-03-24 DIAGNOSIS — N521 Erectile dysfunction due to diseases classified elsewhere: Secondary | ICD-10-CM | POA: Diagnosis not present

## 2018-03-24 DIAGNOSIS — K5909 Other constipation: Secondary | ICD-10-CM | POA: Diagnosis not present

## 2018-03-24 DIAGNOSIS — Z23 Encounter for immunization: Secondary | ICD-10-CM

## 2018-03-24 DIAGNOSIS — E1165 Type 2 diabetes mellitus with hyperglycemia: Secondary | ICD-10-CM | POA: Diagnosis not present

## 2018-03-24 MED ORDER — DULAGLUTIDE 0.75 MG/0.5ML ~~LOC~~ SOAJ: 0.7500 mg | SUBCUTANEOUS | 5 refills | Status: DC

## 2018-03-24 MED ORDER — SILDENAFIL CITRATE 20 MG PO TABS: ORAL_TABLET | ORAL | 0 refills | Status: DC

## 2018-03-24 NOTE — Patient Instructions (Addendum)
Bruce Brandt,   Thank you for coming in to clinic today.  1. 1. START Metamucil 1/2 dose once per day.  Take with 8 oz. water.  As tolerated iIncrease to 1/2 dose twice per day. 2. START colace once per day. - IF NEEDED can use Dulcolax for stimulant laxative. 3. Drink between 1/2 to 1 gallon of water daily  3. Goal is BM every 1-3 days with a soft formed log   - STOP metformin completely.    - Continue Jardiance  - START once weekly Trulicity.  Inject one dose into abdomen every 7 days.  Please schedule a follow-up appointment with Wilhelmina Mcardle, AGNP. Return in about 8 weeks (around 05/19/2018) for diabetes.  If you have any other questions or concerns, please feel free to call the clinic or send a message through MyChart. You may also schedule an earlier appointment if necessary.  You will receive a survey after today's visit either digitally by e-mail or paper by [employer]. Your experiences and feedback matter to [place].  Please respond so we know how we are doing as we provide care for you.   Wilhelmina Mcardle, DNP, AGNP-BC Adult Gerontology Nurse Practitioner [facility], [facility]

## 2018-03-24 NOTE — Progress Notes (Deleted)
d 

## 2018-03-24 NOTE — Progress Notes (Signed)
Subjective:    Patient ID: Bruce Brandt, male    DOB: September 22, 1970, 48 y.o.   MRN: [id number]  Bruce Brandt is a 48 y.o. male presenting on 03/24/2018 for Diabetes and Constipation (pt unsure if its related to his Metformin or Gabapentin )   HPI  Diabetes Pt presents today for follow up of Type 2 diabetes mellitus.  Previously, stopped DM meds due to side effects (fagitue, constipation, feeling poorly).  He has started and continued feeling this way again on medications, but has not yet stopped meds.  Remains willing to try other options to get A1c to goals. - He is checking fasting am CBG at home with a range of 225 today, 255 after breakfast;  - Current diabetic medications include: metformin and Jardiance - He is symptomatic with fatigue, constipation.  - He denies polydipsia, polyphagia, polyuria, headaches, diaphoresis, shakiness, chills, pain, numbness or tingling in extremities and changes in vision.   - Clinical course has been improving. 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 - His diet is moderate in salt, moderate in fat, and moderate in carbohydrates. - Weight trend: stable  PREVENTION: Eye exam current (within one year): no Foot exam current (within one year): no  Lipid/ASCVD risk reduction - on statin: no - patient declines Kidney protection - on ace or arb: no - patient declines Recent Labs    01/03/18 0853 02/10/18 0957  HGBA1C 13.4* 13.5*     Constipation Ongoing concerns.  Patient is concerned about possible metformin/gabapentin side effects.  Change in metformin at last visit did not reduce symptoms.  Gabapentin was increased, so patient is concerned that medication could be causing constipation. - Patient previously had BM 1x daily, now once every 4 days and does not feel like he has complete emptying. - Requests assistance with constipation and if metamucil would be helpful.  Headache Gabapentin managed [facility] Dr[facility]  [facility]  Headache intensity improving, frequency consistent with prior.  Just doubled dose of gabapentin yesterday.  Patient is also concerned gabapentin could be causing constipation.  However, constipation started prior to gabapentin start.  Erectile Dysfunction Patient has difficulty obtaining and maintaining an erection.  Desires to discuss treatment options.  Social History   Tobacco Use  . Smoking status: Never Smoker  . Smokeless tobacco: Never Used  Substance Use Topics  . Alcohol use: Not Currently    Frequency: Never    Comment: > 2 years ago had rare alcohol intake  . Drug use: No    Review of Systems Per HPI unless specifically indicated above     Objective:    BP 117/66 (BP Location: Right Arm, Patient Position: Sitting, Cuff Size: Normal)   Pulse 85   Temp 98.6 F (37 C) (Oral)   Ht 6' (1.829 m)   Wt 224 lb (101.6 kg)   BMI 30.38 kg/m   Wt Readings from Last 3 Encounters:  03/24/18 224 lb (101.6 kg)  02/10/18 222 lb 12.8 oz (101.1 kg)  01/03/18 225 lb 12.8 oz (102.4 kg)    Physical Exam Vitals signs reviewed.  Constitutional:      General: He is not in acute distress.    Appearance: He is well-developed. He is obese.  HENT:     Head: Normocephalic and atraumatic.  Cardiovascular:     Rate and Rhythm: Normal rate and regular rhythm.     Pulses:  Radial pulses are 2+ on the right side and 2+ on the left side.       Posterior tibial pulses are 1+ on the right side and 1+ on the left side.     Heart sounds: Normal heart sounds, S1 normal and S2 normal.  Pulmonary:     Effort: Pulmonary effort is normal. No respiratory distress.     Breath sounds: Normal breath sounds and air entry.  Abdominal:     General: Abdomen is flat. Bowel sounds are decreased.     Palpations: Abdomen is soft.     Tenderness: There is no abdominal tenderness.  Musculoskeletal:     Right lower leg: No edema.     Left lower leg: No edema.   Skin:    General: Skin is warm and dry.     Capillary Refill: Capillary refill takes less than 2 seconds.  Neurological:     Mental Status: He is alert and oriented to person, place, and time.  Psychiatric:        Attention and Perception: Attention normal.        Mood and Affect: Mood and affect normal.        Behavior: Behavior normal. Behavior is cooperative.        Thought Content: Thought content normal.        Judgment: Judgment normal.    Results for orders placed or performed in visit on 02/10/18  POCT glycosylated hemoglobin (Hb A1C)  Result Value Ref Range   Hemoglobin A1C 13.5 (A) 4.0 - 5.6 %   HbA1c POC (<> result, manual entry)     HbA1c, POC (prediabetic range)     HbA1c, POC (controlled diabetic range)        Assessment & Plan:   Problem List Items Addressed This Visit      Endocrine   Type 2 diabetes mellitus with hyperglycemia, without long-term current use of insulin (HCC) - Primary Uncontrolled DM, but with improving daily fasting CBGs.  Patient continues to have goal A1c < 8.0%. - No known complications or hypoglycemia.  Plan:  1. Change therapy:  - Stop metformin due to perceived side effects.  If no improvement in symptoms, will resume in future - CONTINUE Jardiance daily - START Trulicity 0.75 mg injection once every 7 days. 2. Encourage improved lifestyle: - low carb/low glycemic diet reinforced prior education - Increase physical activity to 30 minutes most days of the week.  Explained that increased physical activity increases body's use of sugar for energy. 3. Check fasting am CBG and bring log to next visit for review 4. Encourage patient to continue to consider starting ASA, ACEi and Statin 5. Advised to schedule DM ophtho exam, send record.  6. Follow-up 8 weeks for DM with repeat A1c    Relevant Medications   Dulaglutide (TRULICITY) 0.75 MG/0.5ML SOPN    Other Visit Diagnoses    Need for diphtheria-tetanus-pertussis (Tdap) vaccine        Relevant Orders   Tdap vaccine greater than or equal to 7yo IM   Erectile dysfunction due to diseases classified elsewhere     Patient with erectile dysfunction, likely due to vasculogenic cause from Uncontrolled T2DM.  - START sildenafil 20 mg tablet.  Take 1-3 tablets prior to sexual intercourse for obtaining an erection.  Start w/ one tablet at first dose, then increase to 3 tablets total per dose as needed if unable to have an erection.   - Avoid all nitrates/nitroglycerin for any chest pain  emergencies if using this medication.  - Priapism or having an erection lasting longer than 4 hours is an emergency condition possible when taking this medication.  Seek care in ED if this occurs. - Follow-up prn and in 2 months.   Relevant Medications   sildenafil (REVATIO) 20 MG tablet   Other constipation     Patient with new constipation 2/2 metformin.  STOP metformin due to side effects.  Treat constipation as follows as needed. 1. START Metamucil 1/2 dose once per day.  Take with 8 oz. water.  As tolerated iIncrease to 1/2 dose twice per day. 2. START colace once per day. 3. Drink between 1/2 to 1 gallon of water daily. 3. Goal is BM every 1-3 days with a soft formed log (Stool Type 4) 4. Reviewed signs and symptoms of bowel perforation and when to seek emergency treatment.      Meds ordered this encounter  Medications  . Dulaglutide (TRULICITY) 0.75 MG/0.5ML SOPN    Sig: Inject 0.75 mg into the skin every 7 (seven) days.    Dispense:  4 pen    Refill:  5    Order Specific Question:   Supervising Provider    Answer:   Smitty CordsKARAMALEGOS, ALEXANDER J [[id number]]  . sildenafil (REVATIO) 20 MG tablet    Sig: Take 1-3 tablets about 30 minutes before sex.    Dispense:  30 tablet    Refill:  0    Order Specific Question:   Supervising Provider    Answer:   Smitty CordsKARAMALEGOS, ALEXANDER J [[id number]]    Follow up plan: Return in about 8 weeks (around 05/19/2018) for diabetes.  Wilhelmina McardleLauren Abie Killian, DNP,  AGPCNP-BC Adult Gerontology Primary Care Nurse Practitioner [facility] 03/24/2018, 9:45 AM

## 2018-04-07 ENCOUNTER — Other Ambulatory Visit: Payer: Self-pay | Admitting: Nurse Practitioner

## 2018-04-07 DIAGNOSIS — E1165 Type 2 diabetes mellitus with hyperglycemia: Secondary | ICD-10-CM

## 2018-04-07 MED ORDER — DULAGLUTIDE 0.75 MG/0.5ML ~~LOC~~ SOAJ: 0.7500 mg | SUBCUTANEOUS | 1 refills | Status: DC

## 2018-04-07 NOTE — Telephone Encounter (Signed)
Refill sent for 90 days.  

## 2018-04-07 NOTE — Telephone Encounter (Signed)
Incoming call

## 2018-04-07 NOTE — Telephone Encounter (Signed)
Amy said insurance refused the 30 day refills recently sent.  It has to be 90 supply.  Amy's call back [phone]

## 2018-04-19 ENCOUNTER — Other Ambulatory Visit: Payer: Self-pay | Admitting: Nurse Practitioner

## 2018-04-19 DIAGNOSIS — E1165 Type 2 diabetes mellitus with hyperglycemia: Secondary | ICD-10-CM

## 2018-04-19 NOTE — Telephone Encounter (Signed)
Pt needs 90 refills on libre sensor and trulicity. Amy's call back [phone]

## 2018-04-20 MED ORDER — DULAGLUTIDE 0.75 MG/0.5ML ~~LOC~~ SOAJ: 0.7500 mg | SUBCUTANEOUS | 1 refills | Status: DC

## 2018-04-20 MED ORDER — FREESTYLE LIBRE SENSOR SYSTEM MISC: 1.0000 | Freq: Every day | 0 refills | Status: DC

## 2018-04-25 ENCOUNTER — Other Ambulatory Visit: Payer: Self-pay

## 2018-04-25 DIAGNOSIS — E1165 Type 2 diabetes mellitus with hyperglycemia: Secondary | ICD-10-CM

## 2018-04-25 MED ORDER — FREESTYLE LIBRE SENSOR SYSTEM MISC: 1.0000 | Freq: Every day | 0 refills | Status: DC

## 2018-05-16 ENCOUNTER — Telehealth: Payer: Self-pay

## 2018-05-16 NOTE — Telephone Encounter (Signed)
Discussed with Morrie Sheldon verbally. Policy is to address all visits we can in virtual settings. He has no pressing indication for needing in-person visit.  We will follow-up with him in clinic in next 6-8 weeks once we start seeing in-person encounters more regularly.  No action is required at this time.

## 2018-05-16 NOTE — Telephone Encounter (Signed)
The pt seemed to be very agitated about the recommendation of a  virtual visit. He states this doesn't save him any money, because he still have to pay his co-pay. He said it only save his insurance company money. He states this doesn't make any since why he cannot come in the office and see his provider. I express to the patient that we are trying to keep all of our well patient well by trying to keep them out of the office as much as possible. He express you are trying to keep me out of the office, but then tell me I need to come in the office for a blood draw. I informed the patient that we can just cancel/ reschedule the visit if he prefer. He states that this doesn't make any since as well because he is on medications that needs to be managed. I ask him what would he like to do. He said just cancel the appt. I told him we can always reschedule at a later date. The patient just hung up the phone.

## 2018-05-19 ENCOUNTER — Ambulatory Visit: Payer: 59 | Admitting: Nurse Practitioner

## 2018-05-19 DIAGNOSIS — G4452 New daily persistent headache (NDPH): Secondary | ICD-10-CM | POA: Diagnosis not present

## 2018-07-05 ENCOUNTER — Other Ambulatory Visit: Payer: Self-pay | Admitting: Nurse Practitioner

## 2018-07-05 DIAGNOSIS — E1165 Type 2 diabetes mellitus with hyperglycemia: Secondary | ICD-10-CM

## 2018-07-08 ENCOUNTER — Other Ambulatory Visit: Payer: Self-pay | Admitting: Nurse Practitioner

## 2018-07-08 DIAGNOSIS — E1165 Type 2 diabetes mellitus with hyperglycemia: Secondary | ICD-10-CM

## 2018-07-11 ENCOUNTER — Other Ambulatory Visit: Payer: Self-pay

## 2018-07-11 ENCOUNTER — Telehealth: Payer: Self-pay

## 2018-07-11 NOTE — Telephone Encounter (Signed)
Attempted to contact the pt to f/u on his recent refill request. The pt needs to schedule an appt to f/u on diabetes.

## 2018-07-14 ENCOUNTER — Ambulatory Visit: Payer: 59 | Admitting: Family Medicine

## 2018-07-14 ENCOUNTER — Other Ambulatory Visit: Payer: Self-pay

## 2018-07-14 ENCOUNTER — Encounter: Payer: Self-pay | Admitting: Family Medicine

## 2018-07-14 VITALS — BP 103/70 | HR 83 | Temp 98.3°F | Ht 72.0 in | Wt 218.0 lb

## 2018-07-14 DIAGNOSIS — E663 Overweight: Secondary | ICD-10-CM | POA: Diagnosis not present

## 2018-07-14 DIAGNOSIS — E1165 Type 2 diabetes mellitus with hyperglycemia: Secondary | ICD-10-CM | POA: Diagnosis not present

## 2018-07-14 DIAGNOSIS — N521 Erectile dysfunction due to diseases classified elsewhere: Secondary | ICD-10-CM

## 2018-07-14 LAB — POCT GLYCOSYLATED HEMOGLOBIN (HGB A1C): Hemoglobin A1C: 8.2 % — AB (ref 4.0–5.6)

## 2018-07-14 MED ORDER — SILDENAFIL CITRATE 20 MG PO TABS: ORAL_TABLET | ORAL | 2 refills | Status: DC

## 2018-07-14 NOTE — Assessment & Plan Note (Signed)
Weight improving, 218 lbs, down from 225 Encourage keep lifestyle diet Continue GLP1

## 2018-07-14 NOTE — Progress Notes (Signed)
Subjective:    Patient ID: Bruce Brandt, male    DOB: 1971/01/06, 48 y.o.   MRN: [id number]  Bruce Brandt is a 48 y.o. male presenting on 07/14/2018 for Diabetes (fasting bs ranging 150- 170)  PCP is Wilhelmina McardleLauren Kennedy, AGPCNP-BC - I am currently covering during her maternity leave.   HPI  CHRONIC DM, Type 2: Last visit with PCP 03/2018, he was started on Trulicity at that time. He is doing much better with diabetes. He admits feeling slightly fatigued or tired at times. CBGs: Avg 150-170, Low 120, High < 200. Checks CBGs Meds: Trulicity 0.75mg  weekly injection, Metformin XR 500mg  twice daily, Jardiance 25mg  daily Reports good compliance. Tolerating well w/o side-effects Currently on not ACEi / ARB Lifestyle: - Diet (trying to improve diet, reduced sugars carbs, reduced carbs)  - Exercise (improving exercise but mostly active at work) Denies hypoglycemia, polyuria, visual changes, numbness or tingling.  Erectile dysfunction Previous visit, trial on Sildenafil, without significant relief, he took up to 3 pills 20mg  each without ability to obtain erection.   Depression screen PHQ 2/9 01/03/2018  Decreased Interest 0  Down, Depressed, Hopeless 0  PHQ - 2 Score 0    Social History   Tobacco Use  . Smoking status: Never Smoker  . Smokeless tobacco: Never Used  Substance Use Topics  . Alcohol use: Not Currently    Frequency: Never    Comment: > 2 years ago had rare alcohol intake  . Drug use: No    Review of Systems Per HPI unless specifically indicated above     Objective:    BP 103/70 (BP Location: Left Arm, Patient Position: Sitting, Cuff Size: Normal)   Pulse 83   Temp 98.3 F (36.8 C) (Oral)   Ht 6' (1.829 m)   Wt 218 lb (98.9 kg)   BMI 29.57 kg/m   Wt Readings from Last 3 Encounters:  07/14/18 218 lb (98.9 kg)  03/24/18 224 lb (101.6 kg)  02/10/18 222 lb 12.8 oz (101.1 kg)    Physical Exam Vitals signs and nursing note reviewed.  Constitutional:    General: He is not in acute distress.    Appearance: He is well-developed. He is not diaphoretic.     Comments: Well-appearing, comfortable, cooperative  HENT:     Head: Normocephalic and atraumatic.  Eyes:     General:        Right eye: No discharge.        Left eye: No discharge.     Conjunctiva/sclera: Conjunctivae normal.  Cardiovascular:     Rate and Rhythm: Normal rate.  Pulmonary:     Effort: Pulmonary effort is normal.  Skin:    General: Skin is warm and dry.     Findings: No erythema or rash.  Neurological:     Mental Status: He is alert and oriented to person, place, and time.  Psychiatric:        Behavior: Behavior normal.     Comments: Well groomed, good eye contact, normal speech and thoughts       Recent Labs    01/03/18 0853 02/10/18 0957 07/14/18 0821  HGBA1C 13.4* 13.5* 8.2*     Results for orders placed or performed in visit on 07/14/18  POCT glycosylated hemoglobin (Hb A1C)  Result Value Ref Range   Hemoglobin A1C 8.2 (A) 4.0 - 5.6 %      Assessment & Plan:   Problem List Items Addressed This Visit    Overweight (BMI  25.0-29.9)    Weight improving, 218 lbs, down from 225 Encourage keep lifestyle diet Continue GLP1      Type 2 diabetes mellitus with hyperglycemia, without long-term current use of insulin (HCC) - Primary    Significantly improved DM control A1c 8.2%, which is down from >13% No hypoglycemia Complications - peripheral neuropathy and some hyperglycemia  Plan:  1. Continue current therapy - Trulicity 0.75mg  weekly injection GLP1, Jardiance 25mg  daily SGLT2, Metformin XR 500mg  BID - offered to change Metformin due to recall, he will check with pharmacy to see if his medicine was affected - Also discussed possible change trulicity inc to 1.5mg  dosage, but we mutually agreed to defer for 3 more months to see if his trend is still improving or if it is plateau around 8, in which case next visit can increase Trulicity and possibly  reduce or adjust the other meds 2. Encourage improved lifestyle - low carb, low sugar diet, reduce portion size, continue improving regular exercise 3. Check CBG , bring log to next visit for review 4. Will be due for DM Foot exam next visit      Relevant Orders   POCT glycosylated hemoglobin (Hb A1C) (Completed)    Other Visit Diagnoses    Erectile dysfunction due to diseases classified elsewhere       Relevant Medications   sildenafil (REVATIO) 20 MG tablet   Other Relevant Orders   Ambulatory referral to Urology      Meds ordered this encounter  Medications  . sildenafil (REVATIO) 20 MG tablet    Sig: Take up to 5 pills about 30 min prior to sex    Dispense:  30 tablet    Refill:  2     Follow up plan: Return in about 3 months (around 10/14/2018) for DM A1c, med adjust.   Saralyn Pilar, DO [facility] 07/14/2018, 8:16 AM

## 2018-07-14 NOTE — Assessment & Plan Note (Addendum)
Significantly improved DM control A1c 8.2%, which is down from >13% No hypoglycemia Complications - peripheral neuropathy and some hyperglycemia  Plan:  1. Continue current therapy - Trulicity 0.75mg  weekly injection GLP1, Jardiance 25mg  daily SGLT2, Metformin XR 500mg  BID - offered to change Metformin due to recall, he will check with pharmacy to see if his medicine was affected - Also discussed possible change trulicity inc to 1.5mg  dosage, but we mutually agreed to defer for 3 more months to see if his trend is still improving or if it is plateau around 8, in which case next visit can increase Trulicity and possibly reduce or adjust the other meds 2. Encourage improved lifestyle - low carb, low sugar diet, reduce portion size, continue improving regular exercise 3. Check CBG , bring log to next visit for review 4. Will be due for DM Foot exam next visit

## 2018-07-14 NOTE — Patient Instructions (Addendum)
Thank you for coming to the office today.  Recent Labs    01/03/18 0853 02/10/18 0957 07/14/18 0821  HGBA1C 13.4* 13.5* 8.2*   Keep up the great work. Sugar is improving.  No change today. Keep trulicity 0.75, next visit discuss with Lauren based on reading, if you can increase medicine, or reduce other med, or keep same.  Try 5 pills of sildenafil generic  Please schedule a Follow-up Appointment to: Return in about 3 months (around 10/14/2018) for DM A1c, med adjust.  If you have any other questions or concerns, please feel free to call the office or send a message through MyChart. You may also schedule an earlier appointment if necessary.  Additionally, you may be receiving a survey about your experience at our office within a few days to 1 week by e-mail or mail. We value your feedback.  Saralyn Pilar, DO [facility], [place]

## 2018-08-06 ENCOUNTER — Other Ambulatory Visit: Payer: Self-pay | Admitting: Nurse Practitioner

## 2018-08-06 DIAGNOSIS — E1165 Type 2 diabetes mellitus with hyperglycemia: Secondary | ICD-10-CM

## 2018-08-13 ENCOUNTER — Other Ambulatory Visit: Payer: Self-pay | Admitting: Nurse Practitioner

## 2018-08-13 DIAGNOSIS — E1165 Type 2 diabetes mellitus with hyperglycemia: Secondary | ICD-10-CM

## 2018-08-24 ENCOUNTER — Other Ambulatory Visit: Payer: Self-pay | Admitting: Nurse Practitioner

## 2018-08-24 DIAGNOSIS — E1165 Type 2 diabetes mellitus with hyperglycemia: Secondary | ICD-10-CM

## 2018-09-05 ENCOUNTER — Other Ambulatory Visit: Payer: Self-pay

## 2018-09-05 ENCOUNTER — Ambulatory Visit: Payer: 59 | Admitting: Urology

## 2018-09-05 ENCOUNTER — Encounter: Payer: Self-pay | Admitting: Urology

## 2018-09-05 VITALS — BP 117/79 | HR 132 | Ht 72.0 in | Wt 223.1 lb

## 2018-09-05 DIAGNOSIS — N5201 Erectile dysfunction due to arterial insufficiency: Secondary | ICD-10-CM | POA: Diagnosis not present

## 2018-09-05 MED ORDER — TADALAFIL 20 MG PO TABS: ORAL_TABLET | ORAL | 0 refills | Status: DC

## 2018-09-05 NOTE — Patient Instructions (Signed)
Erectile Dysfunction Erectile dysfunction (ED) is the inability to get or keep an erection in order to have sexual intercourse. Erectile dysfunction may include:  Inability to get an erection.  Lack of enough hardness of the erection to allow penetration.  Loss of the erection before sex is finished. What are the causes? This condition may be caused by:  Certain medicines, such as: ? Pain relievers. ? Antihistamines. ? Antidepressants. ? Blood pressure medicines. ? Water pills (diuretics). ? Ulcer medicines. ? Muscle relaxants. ? Drugs.  Excessive drinking.  Psychological causes, such as: ? Anxiety. ? Depression. ? Sadness. ? Exhaustion. ? Performance fear. ? Stress.  Physical causes, such as: ? Artery problems. This may include diabetes, smoking, liver disease, or atherosclerosis. ? High blood pressure. ? Hormonal problems, such as low testosterone. ? Obesity. ? Nerve problems. This may include back or pelvic injuries, diabetes mellitus, multiple sclerosis, or Parkinson disease. What are the signs or symptoms? Symptoms of this condition include:  Inability to get an erection.  Lack of enough hardness of the erection to allow penetration.  Loss of the erection before sex is finished.  Normal erections at some times, but with frequent unsatisfactory episodes.  Low sexual satisfaction in either partner due to erection problems.  A curved penis occurring with erection. The curve may cause pain or the penis may be too curved to allow for intercourse.  Never having nighttime erections. How is this diagnosed? This condition is often diagnosed by:  Performing a physical exam to find other diseases or specific problems with the penis.  Asking you detailed questions about the problem.  Performing blood tests to check for diabetes mellitus or to measure hormone levels.  Performing other tests to check for underlying health conditions.  Performing an ultrasound  exam to check for scarring.  Performing a test to check blood flow to the penis.  Doing a sleep study at home to measure nighttime erections. How is this treated? This condition may be treated by:  Medicine taken by mouth to help you achieve an erection (oral medicine).  Hormone replacement therapy to replace low testosterone levels.  Medicine that is injected into the penis. Your health care provider may instruct you how to give yourself these injections at home.  Vacuum pump. This is a pump with a ring on it. The pump and ring are placed on the penis and used to create pressure that helps the penis become erect.  Penile implant surgery. In this procedure, you may receive: ? An inflatable implant. This consists of cylinders, a pump, and a reservoir. The cylinders can be inflated with a fluid that helps to create an erection, and they can be deflated after intercourse. ? A semi-rigid implant. This consists of two silicone rubber rods. The rods provide some rigidity. They are also flexible, so the penis can both curve downward in its normal position and become straight for sexual intercourse.  Blood vessel surgery, to improve blood flow to the penis. During this procedure, a blood vessel from a different part of the body is placed into the penis to allow blood to flow around (bypass) damaged or blocked blood vessels.  Lifestyle changes, such as exercising more, losing weight, and quitting smoking. Follow these instructions at home: Medicines   Take over-the-counter and prescription medicines only as told by your health care provider. Do not increase the dosage without first discussing it with your health care provider.  If you are using self-injections, perform injections as directed by your   health care provider. Make sure to avoid any veins that are on the surface of the penis. After giving an injection, apply pressure to the injection site for 5 minutes. General instructions   Exercise regularly, as directed by your health care provider. Work with your health care provider to lose weight, if needed.  Do not use any products that contain nicotine or tobacco, such as cigarettes and e-cigarettes. If you need help quitting, ask your health care provider.  Before using a vacuum pump, read the instructions that come with the pump and discuss any questions with your health care provider.  Keep all follow-up visits as told by your health care provider. This is important. Contact a health care provider if:  You feel nauseous.  You vomit. Get help right away if:  You are taking oral or injectable medicines and you have an erection that lasts longer than 4 hours. If your health care provider is unavailable, go to the nearest emergency room for evaluation. An erection that lasts much longer than 4 hours can result in permanent damage to your penis.  You have severe pain in your groin or abdomen.  You develop redness or severe swelling of your penis.  You have redness spreading up into your groin or lower abdomen.  You are unable to urinate.  You experience chest pain or a rapid heart beat (palpitations) after taking oral medicines. Summary  Erectile dysfunction (ED) is the inability to get or keep an erection during sexual intercourse. This problem can usually be treated successfully.  This condition is diagnosed based on a physical exam, your symptoms, and tests to determine the cause. Treatment varies depending on the cause, and may include medicines, hormone therapy, surgery, or vacuum pump.  You may need follow-up visits to make sure that you are using your medicines or devices correctly.  Get help right away if you are taking or injecting medicines and you have an erection that lasts longer than 4 hours. This information is not intended to replace advice given to you by your health care provider. Make sure you discuss any questions you have with your health care  provider. Document Released: 01/23/2000 Document Revised: 01/07/2017 Document Reviewed: 02/11/2016 Elsevier Patient Education  2020 Elsevier Inc.  

## 2018-09-05 NOTE — Progress Notes (Signed)
09/05/2018 8:42 AM   Bruce Brandt October 18, 1970 [id number]  Referring provider: Mikey College, NP [place],  [place] [address]  Chief Complaint  Patient presents with  . Erectile Dysfunction    HPI: Bruce Brandt is a 48 y.o. male seen in consultation at the request of Cassell Smiles, NP for evaluation of erectile dysfunction.  He is ED was relatively acute in onset.  He has only partial erections which are not firm enough for penetration.  No prior pain or curvature with erections.  He states his libido is good.  He has mild tiredness/fatigue.  Prior to onset of the symptoms 8 months ago he states he was not having any problems.  Organic risk factors include History uncontrolled type 2 diabetes.  No prior tobacco use.  He was given a trial of sildenafil which he used on 2 occasions at 60 mg and 100 mg which was not effective.  PMH: Past Medical History:  Diagnosis Date  . Carpal tunnel syndrome, bilateral 05/18/2016  . Diabetes ([place])   . Frequent headaches   . Gingivitis     Surgical History: Past Surgical History:  Procedure Laterality Date  . MOUTH SURGERY     gingival repair  . VASECTOMY      Home Medications:  Allergies as of 09/05/2018   No Known Allergies     Medication List       Accurate as of September 05, 2018  8:42 AM. If you have any questions, ask your nurse or doctor.        Aleve PM 220-25 MG Tabs Generic drug: Naproxen Sod-diphenhydrAMINE Take 2 tablets by mouth at bedtime as needed.   aspirin EC 81 MG tablet Take 81 mg by mouth daily.   baclofen 10 MG tablet Commonly known as: LIORESAL   diclofenac 50 MG tablet Commonly known as: CATAFLAM TAKE 1 TABLET BY MOUTH TWICE A DAY FOR 3 DAYS FOR HEADACHE RESCUE. DO NOT TAKE WITH OTHER NSAIDS   Dulaglutide 0.75 MG/0.5ML Sopn Commonly known as: Trulicity Inject 0.10 mg into the skin every 7 (seven) days.   FreeStyle Libre 14 Day Sensor Misc USE AS DIRECTED   gabapentin 300 MG  capsule Commonly known as: NEURONTIN Take 1,200 mg by mouth 2 (two) times daily.   gabapentin 600 MG tablet Commonly known as: NEURONTIN Take 1,200 mg by mouth 2 (two) times daily.   glucose blood test strip Commonly known as: FREESTYLE TEST STRIPS Use as instructed to check blood sugar up to two times daily.   Jardiance 25 MG Tabs tablet Generic drug: empagliflozin TAKE 1 TABLET BY MOUTH EVERY DAY   Juice Plus Fibre Liqd Take by mouth.   metFORMIN 500 MG 24 hr tablet Commonly known as: GLUCOPHAGE-XR TAKE 1 TABLET BY MOUTH TWICE A DAY   sildenafil 20 MG tablet Commonly known as: REVATIO Take up to 5 pills about 30 min prior to sex       Allergies: No Known Allergies  Family History: Family History  Problem Relation Age of Onset  . Heart disease Father   . Diabetes Maternal Grandmother   . Diabetes Paternal Grandmother     Social History:  reports that he has never smoked. He has never used smokeless tobacco. He reports previous alcohol use. He reports that he does not use drugs.  ROS: UROLOGY Frequent Urination?: No Hard to postpone urination?: No Burning/pain with urination?: No Get up at night to urinate?: No Leakage of urine?: No Urine  stream starts and stops?: No Trouble starting stream?: No Do you have to strain to urinate?: No Blood in urine?: No Urinary tract infection?: No Sexually transmitted disease?: No Injury to kidneys or bladder?: No Painful intercourse?: No Weak stream?: No Erection problems?: Yes Penile pain?: No  Gastrointestinal Nausea?: No Vomiting?: No Indigestion/heartburn?: No Diarrhea?: No Constipation?: No  Constitutional Fever: No Night sweats?: No Weight loss?: No Fatigue?: No  Skin Skin rash/lesions?: No Itching?: No  Eyes Blurred vision?: No Double vision?: No  Ears/Nose/Throat Sore throat?: No Sinus problems?: No  Hematologic/Lymphatic Swollen glands?: No Easy bruising?: No  Cardiovascular Leg  swelling?: No Chest pain?: No  Respiratory Cough?: No Shortness of breath?: No  Endocrine Excessive thirst?: No  Musculoskeletal Back pain?: No Joint pain?: No  Neurological Headaches?: No Dizziness?: No  Psychologic Depression?: No Anxiety?: No  Physical Exam: BP 117/79 (BP Location: Left Arm, Patient Position: Sitting, Cuff Size: Normal)   Pulse (!) 132   Ht 6' (1.829 m)   Wt 223 lb 1.6 oz (101.2 kg)   BMI 30.26 kg/m   Constitutional:  Alert and oriented, No acute distress. HEENT: [place] AT, moist mucus membranes.  Trachea midline, no masses. Cardiovascular: No clubbing, cyanosis, or edema. Respiratory: Normal respiratory effort, no increased work of breathing. GI: Abdomen is soft, nontender, nondistended, no abdominal masses GU: Phallus circumcised without lesions.  No palpable plaques.  Testes descended bilaterally without masses or tenderness.  Estimated size 20 cc bilaterally.  Cord/epididymis palpably normal bilaterally. Skin: No rashes, bruises or suspicious lesions. Neurologic: Grossly intact, no focal deficits, moving all 4 extremities. Psychiatric: Normal mood and affect.   Assessment & Plan:   -Erectile dysfunction: Relative acute in onset and severe with minimal partial erections.  Will check a baseline testosterone level/LH.  An alternative PDE 5 inhibitor may not be effective due to severity of disease however he was interested in a trial.  Rx generic tadalafil was sent to pharmacy.  Was recommended he try this on at least 5 occasions.  If not effective we discussed intracavernosal injections and vacuum erection devices.  If interested in either of these he will call back.  Riki AltesScott C Stoioff, MD  [facility] [address], [address] [place], [place] [address] [phone]

## 2018-09-06 LAB — TESTOSTERONE: Testosterone: 307 ng/dL (ref 264–916)

## 2018-09-06 LAB — LUTEINIZING HORMONE: LH: 2.6 m[IU]/mL (ref 1.7–8.6)

## 2018-09-11 ENCOUNTER — Telehealth: Payer: Self-pay

## 2018-09-11 NOTE — Telephone Encounter (Signed)
-----   Message from Abbie Sons, MD sent at 09/10/2018 12:14 PM EDT ----- Testosterone level was low normal at 307.  Please see if lab will run a free testosterone level on the specimen.

## 2018-09-11 NOTE — Telephone Encounter (Signed)
-----   Message from Scott C Stoioff, MD sent at 09/10/2018 12:14 PM EDT ----- Testosterone level was low normal at 307.  Please see if lab will run a free testosterone level on the specimen. 

## 2018-09-11 NOTE — Telephone Encounter (Signed)
Called [facility], test number [id number] added on for patient.

## 2018-09-18 LAB — TESTOSTERONE FREE, PROFILE I

## 2018-09-18 LAB — SPECIMEN STATUS REPORT

## 2018-09-19 ENCOUNTER — Telehealth: Payer: Self-pay | Admitting: Family Medicine

## 2018-09-19 DIAGNOSIS — R6882 Decreased libido: Secondary | ICD-10-CM

## 2018-09-19 NOTE — Telephone Encounter (Signed)
-----   Message from Scott C Stoioff, MD sent at 09/10/2018 12:14 PM EDT ----- Testosterone level was low normal at 307.  Please see if lab will run a free testosterone level on the specimen. 

## 2018-09-19 NOTE — Telephone Encounter (Signed)
The Testosterone Free was ordered and called in by [facility]. They cancelled the order though. Would you like him to come in to have this drawn again?

## 2018-09-21 NOTE — Telephone Encounter (Signed)
Patient notified and made a lab appointment

## 2018-09-22 ENCOUNTER — Other Ambulatory Visit: Payer: Self-pay

## 2018-09-22 ENCOUNTER — Other Ambulatory Visit: Payer: 59

## 2018-09-22 DIAGNOSIS — R6882 Decreased libido: Secondary | ICD-10-CM

## 2018-09-23 LAB — TESTOSTERONE FREE, PROFILE I
Albumin: 4.8 g/dL (ref 4.0–5.0)
Sex Hormone Binding: 17.6 nmol/L (ref 16.5–55.9)
Testost., Free, Calc: 41.3 pg/mL (ref 30.3–183.2)
Testosterone: 171 ng/dL — ABNORMAL LOW (ref 264–916)

## 2018-09-26 ENCOUNTER — Telehealth: Payer: Self-pay

## 2018-09-26 DIAGNOSIS — E291 Testicular hypofunction: Secondary | ICD-10-CM

## 2018-09-26 NOTE — Telephone Encounter (Signed)
Patient notified he states he would like to have a second draw. Scheduled for tomorrow and order placed

## 2018-09-26 NOTE — Telephone Encounter (Signed)
-----   Message from Abbie Sons, MD sent at 09/25/2018  2:05 PM EDT ----- Repeat total testosterone level was low.  If he is interested in TRT he will need a second low testosterone.  He would also be a candidate for oral Clomid without a repeat level.  Can start Clomid which will not be covered by insurance but is available in a generic.  If he is interested in TRT will need to repeat an a.m. level.

## 2018-09-27 ENCOUNTER — Other Ambulatory Visit: Payer: 59

## 2018-09-27 ENCOUNTER — Other Ambulatory Visit: Payer: Self-pay

## 2018-09-27 DIAGNOSIS — E291 Testicular hypofunction: Secondary | ICD-10-CM

## 2018-09-28 LAB — TESTOSTERONE: Testosterone: 197 ng/dL — ABNORMAL LOW (ref 264–916)

## 2018-10-02 ENCOUNTER — Telehealth: Payer: Self-pay | Admitting: Family Medicine

## 2018-10-02 ENCOUNTER — Other Ambulatory Visit: Payer: Self-pay | Admitting: Urology

## 2018-10-02 MED ORDER — TESTOSTERONE 20.25 MG/ACT (1.62%) TD GEL: TRANSDERMAL | 1 refills | Status: DC

## 2018-10-02 NOTE — Telephone Encounter (Signed)
-----   Message from Abbie Sons, MD sent at 10/01/2018  9:23 AM EDT ----- Repeat second a.m. testosterone level is low.  He expressed TRT.  Options are topical gel, intramuscular injections and Xyosted.  Berdine Addison may not be covered by insurance.  Does he have a preference?

## 2018-10-02 NOTE — Telephone Encounter (Signed)
Patient notified and wants to try the gel.

## 2018-10-02 NOTE — Telephone Encounter (Signed)
Patient notified of RX for testosterone gel.  Appt scheduled for 7 weeks with Dr. Bernardo Heater.

## 2018-10-18 ENCOUNTER — Other Ambulatory Visit: Payer: Self-pay

## 2018-10-18 ENCOUNTER — Ambulatory Visit: Payer: 59 | Admitting: Nurse Practitioner

## 2018-10-18 ENCOUNTER — Encounter: Payer: Self-pay | Admitting: Nurse Practitioner

## 2018-10-18 VITALS — BP 111/69 | HR 91 | Ht 72.0 in | Wt 220.4 lb

## 2018-10-18 DIAGNOSIS — S61210A Laceration without foreign body of right index finger without damage to nail, initial encounter: Secondary | ICD-10-CM | POA: Diagnosis not present

## 2018-10-18 DIAGNOSIS — Z23 Encounter for immunization: Secondary | ICD-10-CM | POA: Diagnosis not present

## 2018-10-18 DIAGNOSIS — E1165 Type 2 diabetes mellitus with hyperglycemia: Secondary | ICD-10-CM

## 2018-10-18 LAB — POCT GLYCOSYLATED HEMOGLOBIN (HGB A1C): Hemoglobin A1C: 7.6 % — AB (ref 4.0–5.6)

## 2018-10-18 LAB — POCT UA - MICROALBUMIN: Microalbumin Ur, POC: NEGATIVE mg/L

## 2018-10-18 MED ORDER — MUPIROCIN 2 % EX OINT: 1.0000 "application " | TOPICAL_OINTMENT | Freq: Two times a day (BID) | CUTANEOUS | 0 refills | Status: AC

## 2018-10-18 MED ORDER — ROSUVASTATIN CALCIUM 20 MG PO TABS: 20.0000 mg | ORAL_TABLET | ORAL | 3 refills | Status: DC

## 2018-10-18 NOTE — Patient Instructions (Signed)
Bruce Brandt,   Thank you for coming in to clinic today.  1. Continue all diabetes medications without changes  2. START rosuvastatin 20 mg once weekly.  3. Urine test today - we may start a kidney protector.  Please schedule a follow-up appointment with Cassell Smiles, AGNP. No follow-ups on file.  If you have any other questions or concerns, please feel free to call the clinic or send a message through [place]. You may also schedule an earlier appointment if necessary.  You will receive a survey after today's visit either digitally by e-mail or paper by [employer]. Your experiences and feedback matter to [place].  Please respond so we know how we are doing as we provide care for you.   Cassell Smiles, DNP, AGNP-BC Adult Gerontology Nurse Practitioner [place]

## 2018-10-18 NOTE — Progress Notes (Signed)
Subjective:    Patient ID: Bruce Brandt, male    DOB: 06-27-1970, 48 y.o.   MRN: [id number]  Bruce Brandt is a 48 y.o. male presenting on 10/18/2018 for Diabetes  HPI Diabetes Pt presents today for follow up of Type 2 diabetes mellitus. He is not checking CBG at home - Current diabetic medications include:  metformin XR 500 mg bid, trulicity 0.75 mg every 7 days, Jardiance 25 mg tab daily - He is not currently symptomatic.  - He denies polydipsia, polyphagia, polyuria, headaches, diaphoresis, shakiness, chills, pain, numbness or tingling in extremities and changes in vision.   - Clinical course has been improving. 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 - His diet is moderate in salt, moderate in fat, and moderate in carbohydrates. - Weight trend: stable  PREVENTION: Eye exam current (within one year): no Foot exam current (within one year): no Lipid/ASCVD risk reduction - on statin: no - start today Kidney protection - on ace or arb: Microalbumin as patient wants to reduce medications  Recent Labs    01/03/18 0853 02/10/18 0957 07/14/18 0821  HGBA1C 13.4* 13.5* 8.2*   Skin tear/Wounds non-healing  Fragile skin noted with wounds RIGHT first finger (1 month ago), LEFT shin (1 week ago).  These are not fully healing and patient is concerned about healing time.  Finger wound noted near knuckle and with evidence of chronic splitting of the scab/reopening of wound.  Social History   Tobacco Use  . Smoking status: Never Smoker  . Smokeless tobacco: Never Used  Substance Use Topics  . Alcohol use: Not Currently    Frequency: Never    Comment: > 2 years ago had rare alcohol intake  . Drug use: No    Review of Systems Per HPI unless specifically indicated above     Objective:    BP 111/69 (BP Location: Right Arm, Patient Position: Sitting, Cuff Size: Normal)   Pulse 91   Ht 6' (1.829 m)   Wt 220 lb 6.4 oz (100 kg)   BMI  29.89 kg/m   Wt Readings from Last 3 Encounters:  10/18/18 220 lb 6.4 oz (100 kg)  09/05/18 223 lb 1.6 oz (101.2 kg)  07/14/18 218 lb (98.9 kg)    Physical Exam Vitals signs reviewed.  Constitutional:      General: He is not in acute distress.    Appearance: He is well-developed.  HENT:     Head: Normocephalic and atraumatic.  Cardiovascular:     Rate and Rhythm: Normal rate and regular rhythm.     Pulses:          Radial pulses are 2+ on the right side and 2+ on the left side.       Posterior tibial pulses are 1+ on the right side and 1+ on the left side.     Heart sounds: Normal heart sounds, S1 normal and S2 normal.  Pulmonary:     Effort: Pulmonary effort is normal. No respiratory distress.     Breath sounds: Normal breath sounds and air entry.  Musculoskeletal:     Right lower leg: No edema.     Left lower leg: No edema.  Skin:    General: Skin is warm and dry.     Capillary Refill: Capillary refill takes less than 2 seconds.     Comments: Wound with scabbing, reopening evident at 2nd digit near PIP joint.  Wound  not complicated with swelling, draining, or erythema.  Second wound at left medial shin.  Well healing with scab and no periwound swelling, erythema, no drainage.  Neurological:     Mental Status: He is alert and oriented to person, place, and time.  Psychiatric:        Attention and Perception: Attention normal.        Mood and Affect: Mood and affect normal.        Behavior: Behavior normal. Behavior is cooperative.    Results for orders placed or performed in visit on 09/27/18  Testosterone  Result Value Ref Range   Testosterone 197 (L) 264 - 916 ng/dL      Assessment & Plan:   Problem List Items Addressed This Visit      Endocrine   Type 2 diabetes mellitus with hyperglycemia, without long-term current use of insulin (HCC) - Primary ControlledDM with A1c below goal improved from 3 months ago and goal A1c < 8.0%.  Ideal goal < 7.0%, but patient is  complaining of mild fatigue with medications today.  This is why patient stopped all meds in past, so will not increase at this time. - Complications - dyslipidemia.  Plan:  1. Continue current therapy: metformin, trulicity 0.75 mg weekly, Jardiance 25 mg daily 2. Encourage improved lifestyle: - low carb/low glycemic diet reinforced prior education - Increase physical activity to 30 minutes most days of the week.  Explained that increased physical activity increases body's use of sugar for energy. 3. Check fasting am CBG and bring log to next visit for review 4. Discussed starting statin today - recommend once weekly start due to past side effects - START rosuvastatin 20 mg once weekly - Microalbumin today normal - no need for ACE/ARB at this time 5. Advised to schedule DM ophtho exam, send record. 6. Follow-up 3 months    Relevant Medications   rosuvastatin (CRESTOR) 20 MG tablet   Other Relevant Orders   POCT glycosylated hemoglobin (Hb A1C) (Completed)   POCT UA - Microalbumin (Completed)   Lipid panel (Completed)   Comprehensive metabolic panel (Completed)    Other Visit Diagnoses    Laceration of right index finger without foreign body without damage to nail, initial encounter     Delayed healing, due to location and reopening of wound.  Also due to DM.   Plan: 1. Dressed wound today with Xeroform gauze 2. Change dressing when wet or soiled 3. Apply mupirocin bid  X 5-7 days. 4. Follow-up if continues to be poorly healing or starts draining.   Relevant Medications   mupirocin ointment (BACTROBAN) 2 %   Need for immunization against influenza     Pt < age 34.  Needs annual influenza vaccine.  Plan: 1. Administer Quad flu vaccine.   Relevant Orders   Flu Vaccine QUAD 36+ mos IM (Completed)      Meds ordered this encounter  Medications  . rosuvastatin (CRESTOR) 20 MG tablet    Sig: Take 1 tablet (20 mg total) by mouth every 7 (seven) days.    Dispense:  13 tablet     Refill:  3    Order Specific Question:   Supervising Provider    Answer:   Smitty Cords [[id number]]  . mupirocin ointment (BACTROBAN) 2 %    Sig: Apply 1 application topically 2 (two) times daily for 5 days. Repeat as needed for poorly healing skin wounds.    Dispense:  22 g    Refill:  0  Order Specific Question:   Supervising Provider    Answer:   Olin Hauser [[id number]]   Follow up plan: Return in about 3 months (around 01/17/2019) for diabetes.  Cassell Smiles, DNP, AGPCNP-BC Adult Gerontology Primary Care Nurse Practitioner [facility] 10/18/2018, 8:18 AM

## 2018-10-19 LAB — COMPREHENSIVE METABOLIC PANEL
AG Ratio: 2 (calc) (ref 1.0–2.5)
ALT: 14 U/L (ref 9–46)
AST: 11 U/L (ref 10–40)
Albumin: 4.7 g/dL (ref 3.6–5.1)
Alkaline phosphatase (APISO): 68 U/L (ref 36–130)
BUN: 15 mg/dL (ref 7–25)
CO2: 25 mmol/L (ref 20–32)
Calcium: 9.8 mg/dL (ref 8.6–10.3)
Chloride: 107 mmol/L (ref 98–110)
Creat: 0.73 mg/dL (ref 0.60–1.35)
Globulin: 2.3 g/dL (calc) (ref 1.9–3.7)
Glucose, Bld: 160 mg/dL — ABNORMAL HIGH (ref 65–139)
Potassium: 5 mmol/L (ref 3.5–5.3)
Sodium: 141 mmol/L (ref 135–146)
Total Bilirubin: 0.5 mg/dL (ref 0.2–1.2)
Total Protein: 7 g/dL (ref 6.1–8.1)

## 2018-10-19 LAB — LIPID PANEL
Cholesterol: 247 mg/dL — ABNORMAL HIGH (ref <200)
HDL: 54 mg/dL (ref >=40)
LDL Cholesterol (Calc): 163 mg/dL (calc) — ABNORMAL HIGH
Non-HDL Cholesterol (Calc): 193 mg/dL (calc) — ABNORMAL HIGH (ref <130)
Total CHOL/HDL Ratio: 4.6 (calc) (ref <5.0)
Triglycerides: 155 mg/dL — ABNORMAL HIGH (ref <150)

## 2018-10-23 ENCOUNTER — Other Ambulatory Visit: Payer: Self-pay | Admitting: Nurse Practitioner

## 2018-10-23 DIAGNOSIS — E1165 Type 2 diabetes mellitus with hyperglycemia: Secondary | ICD-10-CM

## 2018-10-23 MED ORDER — ATORVASTATIN CALCIUM 10 MG PO TABS: 10.0000 mg | ORAL_TABLET | ORAL | 4 refills | Status: DC

## 2018-11-04 ENCOUNTER — Other Ambulatory Visit: Payer: Self-pay | Admitting: Family Medicine

## 2018-11-04 DIAGNOSIS — E1165 Type 2 diabetes mellitus with hyperglycemia: Secondary | ICD-10-CM

## 2018-11-06 ENCOUNTER — Telehealth: Payer: Self-pay | Admitting: Urology

## 2018-11-06 ENCOUNTER — Other Ambulatory Visit: Payer: Self-pay | Admitting: Nurse Practitioner

## 2018-11-06 DIAGNOSIS — E1165 Type 2 diabetes mellitus with hyperglycemia: Secondary | ICD-10-CM

## 2018-11-06 NOTE — Telephone Encounter (Signed)
Pt.called requesting refill on jardiance 25 mg

## 2018-11-06 NOTE — Telephone Encounter (Signed)
Pt Bruce Brandt and requests a Rx for Testosterone, in pill form be called into his pharmacy.

## 2018-11-06 NOTE — Telephone Encounter (Signed)
Called pt to clarify what medication he needs as the only testosterone on his mediation list is a gel, no answer, LM for pt to call back.

## 2018-11-08 MED ORDER — JARDIANCE 25 MG PO TABS: 25.0000 mg | ORAL_TABLET | Freq: Every day | ORAL | 1 refills | Status: DC

## 2018-11-09 NOTE — Telephone Encounter (Signed)
Called pt to clarify what medication he needs. No answer. 2nd attempt

## 2018-11-16 ENCOUNTER — Other Ambulatory Visit: Payer: Self-pay | Admitting: Family Medicine

## 2018-11-16 DIAGNOSIS — E1165 Type 2 diabetes mellitus with hyperglycemia: Secondary | ICD-10-CM

## 2018-11-23 ENCOUNTER — Encounter: Payer: Self-pay | Admitting: Urology

## 2018-11-23 ENCOUNTER — Ambulatory Visit: Payer: 59 | Admitting: Urology

## 2018-11-23 ENCOUNTER — Other Ambulatory Visit: Payer: Self-pay

## 2018-11-23 VITALS — BP 147/86 | HR 80 | Ht 72.0 in | Wt 215.0 lb

## 2018-11-23 DIAGNOSIS — N5201 Erectile dysfunction due to arterial insufficiency: Secondary | ICD-10-CM

## 2018-11-23 DIAGNOSIS — E291 Testicular hypofunction: Secondary | ICD-10-CM | POA: Diagnosis not present

## 2018-11-23 MED ORDER — CLOMIPHENE CITRATE 50 MG PO TABS: 25.0000 mg | ORAL_TABLET | Freq: Every day | ORAL | 0 refills | Status: DC

## 2018-11-23 NOTE — Progress Notes (Signed)
11/23/2018 8:13 AM   Bruce Brandt [birth year] [id number]  Referring provider: Mikey College, NP [place],  [name] [address]  Chief Complaint  Patient presents with  . Follow-up    HPI: 48 y.o. male initially seen July 2020 for erectile dysfunction.  He had a relative acute onset of ED 8 months prior to that visit.  He had partial erections not firm enough for penetration and sildenafil on 2 occasions was not effective.  He was found to have hypogonadism and desired a trial of TRT.  He started testosterone gel and August 2020 and only took for 1 month.  He did not like this method of replacement.  His libido is good and he only had mild tiredness and fatigue and did not really see any improvement in symptoms on this 1 month of replacement.  He was requesting a pill form of testosterone.   PMH: Past Medical History:  Diagnosis Date  . Carpal tunnel syndrome, bilateral 05/18/2016  . Diabetes ([place])   . Frequent headaches   . Gingivitis     Surgical History: Past Surgical History:  Procedure Laterality Date  . MOUTH SURGERY     gingival repair  . VASECTOMY      Home Medications:  Allergies as of 11/23/2018   No Known Allergies     Medication List       Accurate as of November 23, 2018  8:13 AM. If you have any questions, ask your nurse or doctor.        Aleve PM 220-25 MG Tabs Generic drug: Naproxen Sod-diphenhydrAMINE Take 2 tablets by mouth at bedtime as needed.   aspirin EC 81 MG tablet Take 81 mg by mouth daily.   atorvastatin 10 MG tablet Commonly known as: LIPITOR Take 1 tablet (10 mg total) by mouth once a week.   baclofen 10 MG tablet Commonly known as: LIORESAL   diclofenac 50 MG tablet Commonly known as: CATAFLAM TAKE 1 TABLET BY MOUTH TWICE A DAY FOR 3 DAYS FOR HEADACHE RESCUE. DO NOT TAKE WITH OTHER NSAIDS   Dulaglutide 0.75 MG/0.5ML Sopn Commonly known as: Trulicity Inject 9.93 mg into the skin every 7 (seven) days.    FreeStyle Libre 14 Day Sensor Misc USE AS DIRECTED   gabapentin 600 MG tablet Commonly known as: NEURONTIN Take 1,200 mg by mouth 2 (two) times daily.   glucose blood test strip Commonly known as: FREESTYLE TEST STRIPS Use as instructed to check blood sugar up to two times daily.   Jardiance 25 MG Tabs tablet Generic drug: empagliflozin Take 25 mg by mouth daily.   Juice Plus Fibre Liqd Take by mouth.   metFORMIN 500 MG 24 hr tablet Commonly known as: GLUCOPHAGE-XR TAKE 1 TABLET BY MOUTH TWICE A DAY   sildenafil 20 MG tablet Commonly known as: REVATIO Take up to 5 pills about 30 min prior to sex   tadalafil 20 MG tablet Commonly known as: CIALIS 1 tab by mouth 30-60 minutes prior to intercourse   testosterone 50 MG/5GM (1%) Gel Commonly known as: Testim Apply 1 tube to non-hair bearing area lower abdomen or shoulders daily       Allergies: No Known Allergies  Family History: Family History  Problem Relation Age of Onset  . Heart disease Father   . Diabetes Maternal Grandmother   . Diabetes Paternal Grandmother     Social History:  reports that he has never smoked. He has never used smokeless tobacco. He reports  previous alcohol use. He reports that he does not use drugs.  ROS: UROLOGY Frequent Urination?: No Hard to postpone urination?: No Burning/pain with urination?: No Get up at night to urinate?: Yes Leakage of urine?: No Urine stream starts and stops?: Yes Trouble starting stream?: No Do you have to strain to urinate?: No Blood in urine?: No Urinary tract infection?: No Sexually transmitted disease?: No Injury to kidneys or bladder?: No Painful intercourse?: No Weak stream?: No Erection problems?: No Penile pain?: No  Gastrointestinal Nausea?: No Vomiting?: No Indigestion/heartburn?: No Diarrhea?: No Constipation?: No  Constitutional Fever: No Night sweats?: No Weight loss?: No Fatigue?: No  Skin Skin rash/lesions?: No Itching?:  No  Eyes Blurred vision?: No Double vision?: No  Ears/Nose/Throat Sore throat?: No Sinus problems?: No  Hematologic/Lymphatic Swollen glands?: No Easy bruising?: No  Cardiovascular Leg swelling?: No Chest pain?: No  Respiratory Cough?: No Shortness of breath?: No  Endocrine Excessive thirst?: No  Musculoskeletal Back pain?: No Joint pain?: No  Neurological Headaches?: Yes Dizziness?: No  Psychologic Depression?: No Anxiety?: No  Physical Exam: BP (!) 147/86   Pulse 80   Ht 6' (1.829 m)   Wt 215 lb (97.5 kg)   BMI 29.16 kg/m   Constitutional:  Alert and oriented, No acute distress. HEENT: [place] AT, moist mucus membranes.  Trachea midline, no masses. Cardiovascular: No clubbing, cyanosis, or edema. Respiratory: Normal respiratory effort, no increased work of breathing. Skin: No rashes, bruises or suspicious lesions. Neurologic: Grossly intact, no focal deficits, moving all 4 extremities. Psychiatric: Normal mood and affect.   Assessment & Plan:    - Hypogonadism He was informed oral testosterone was recently approved however is typically not covered by insurance unless other methods fail.  His LH was low normal at 2.6 and we did discuss the off label use of Clomid.  Testosterone injections were also discussed.  He was interested in a trial of Clomid and Rx was sent to pharmacy.  Follow-up testosterone level in 6 weeks and he will be notified with results.  We will also check a baseline PSA.  - Erectile dysfunction We did discuss it is unlikely his ED will resolve with a normal testosterone level however PDE 5 inhibitors may have greater efficacy if his testosterone level is in the normal range.   Riki Altes, MD  [facility] [address], [address] [place], [place] [address] [phone]

## 2018-12-31 ENCOUNTER — Other Ambulatory Visit: Payer: Self-pay | Admitting: Family Medicine

## 2018-12-31 DIAGNOSIS — E1165 Type 2 diabetes mellitus with hyperglycemia: Secondary | ICD-10-CM

## 2019-01-02 ENCOUNTER — Other Ambulatory Visit: Payer: Self-pay | Admitting: Family Medicine

## 2019-01-02 DIAGNOSIS — E291 Testicular hypofunction: Secondary | ICD-10-CM

## 2019-01-03 ENCOUNTER — Other Ambulatory Visit: Payer: 59

## 2019-01-08 ENCOUNTER — Other Ambulatory Visit: Payer: 59

## 2019-01-17 ENCOUNTER — Other Ambulatory Visit: Payer: 59

## 2019-01-17 ENCOUNTER — Other Ambulatory Visit: Payer: Self-pay

## 2019-01-17 DIAGNOSIS — E291 Testicular hypofunction: Secondary | ICD-10-CM

## 2019-01-18 ENCOUNTER — Telehealth: Payer: Self-pay

## 2019-01-18 LAB — PSA: Prostate Specific Ag, Serum: 0.9 ng/mL (ref 0.0–4.0)

## 2019-01-18 LAB — TESTOSTERONE: Testosterone: 516 ng/dL (ref 264–916)

## 2019-01-18 NOTE — Telephone Encounter (Signed)
Called pt informed him of the information below. Pt states that his symptoms are the same with no improvement, however he states he has tried gel before with no improvement and is not interested in injections so he will continue with clomid.

## 2019-01-18 NOTE — Telephone Encounter (Signed)
-----   Message from Abbie Sons, MD sent at 01/18/2019  1:03 PM EST ----- Testosterone level was normal at 516 (previously 197).  PSA was normal.  Have his low T symptoms improved and does he desire to continue Clomid?

## 2019-01-19 ENCOUNTER — Ambulatory Visit: Payer: 59 | Admitting: Nurse Practitioner

## 2019-01-29 ENCOUNTER — Other Ambulatory Visit: Payer: Self-pay | Admitting: Family Medicine

## 2019-01-29 DIAGNOSIS — N521 Erectile dysfunction due to diseases classified elsewhere: Secondary | ICD-10-CM

## 2019-02-07 ENCOUNTER — Encounter: Payer: Self-pay | Admitting: Family Medicine

## 2019-02-07 ENCOUNTER — Other Ambulatory Visit: Payer: Self-pay

## 2019-02-07 ENCOUNTER — Ambulatory Visit: Payer: 59 | Admitting: Family Medicine

## 2019-02-07 VITALS — BP 117/73 | HR 118 | Temp 97.8°F | Resp 16 | Ht 72.0 in | Wt 221.0 lb

## 2019-02-07 DIAGNOSIS — E1142 Type 2 diabetes mellitus with diabetic polyneuropathy: Secondary | ICD-10-CM | POA: Diagnosis not present

## 2019-02-07 DIAGNOSIS — E1165 Type 2 diabetes mellitus with hyperglycemia: Secondary | ICD-10-CM

## 2019-02-07 NOTE — Assessment & Plan Note (Signed)
Previously - Significantly improved DM control A1c 7.6% in 10/2018 Concern with fatigue No hypoglycemia Complications - peripheral neuropathy and some hyperglycemia  Plan:  1. Continue current therapy - Trulicity 0.75mg  weekly injection GLP1, Jardiance 25mg  daily SGLT2, Metformin XR 500mg  BID 2. Encourage improved lifestyle - low carb, low sugar diet, reduce portion size, continue improving regular exercise 3. Check CBG , bring log to next visit for review 4. DM foot exam today  Unable to get A1c POC x 2 today, will send for A1c lab in office vein draw. F/u results next week, discussed that if still very well controlled A1c < 7% I would agree to reduce or DC one of his DM medications for now see if polypharmacy affecting his overall fatigue. He will run out of jardiance in 2 days, he would like to hold that one for now, and see how his A1c result is, ultimately if higher A1c >7 he may be interested in referral to Endocrinology to discuss further.

## 2019-02-07 NOTE — Progress Notes (Signed)
Subjective:    Patient ID: Bruce Brandt, male    DOB: [birth year], 48 y.o.   MRN: [id number]  Bruce Brandt is a 48 y.o. male presenting on 02/07/2019 for Diabetes   HPI   CHRONIC DM, Type 2: Last visit with previous PCP 03/2018, he was started on Trulicity at that time. Last visit with me 07/2018 he was maintained on management He is doing well with diabetes overall. He still admits feeling slightly fatigued or tired. CBGs: Avg 150-170, Low 120, High < 200. Checks CBGs Meds: Trulicity 0.75mg  weekly injection, Metformin XR 500mg  twice daily, Jardiance 25mg  daily Reports good compliance. Tolerating well w/o side-effects Currently on not ACEi / ARB Lifestyle: - Diet (trying to improve diet, reduced sugars carbs, reduced carbs)  - Exercise (improving exercise but mostly active at work) Admits poor sleep often due to various reasons. Sometimes shoulder joint - Previously seen by [facility] - not seen in 2020 Denies hypoglycemia, polyuria, visual changes, numbness or tingling.   Health Maintenance: UTD Flu Vaccine  Depression screen [facility] 2/9 02/07/2019 01/03/2018  Decreased Interest 0 0  Down, Depressed, Hopeless 0 0  PHQ - 2 Score 0 0    Social History   Tobacco Use  . Smoking status: Never Smoker  . Smokeless tobacco: Never Used  Substance Use Topics  . Alcohol use: Not Currently    Comment: > 2 years ago had rare alcohol intake  . Drug use: No    Review of Systems Per HPI unless specifically indicated above     Objective:    BP 117/73   Pulse (!) 118   Temp 97.8 F (36.6 C) (Oral)   Resp 16   Ht 6' (1.829 m)   Wt 221 lb (100.2 kg)   BMI 29.97 kg/m   Wt Readings from Last 3 Encounters:  02/07/19 221 lb (100.2 kg)  11/23/18 215 lb (97.5 kg)  10/18/18 220 lb 6.4 oz (100 kg)    Physical Exam Vitals and nursing note reviewed.  Constitutional:      General: He is not in acute distress.    Appearance: He is well-developed. He is not diaphoretic.    Comments: Well-appearing, comfortable, cooperative  HENT:     Head: Normocephalic and atraumatic.  Eyes:     General:        Right eye: No discharge.        Left eye: No discharge.     Conjunctiva/sclera: Conjunctivae normal.  Cardiovascular:     Rate and Rhythm: Normal rate.  Pulmonary:     Effort: Pulmonary effort is normal.  Skin:    General: Skin is warm and dry.     Findings: No erythema or rash.  Neurological:     Mental Status: He is alert and oriented to person, place, and time.  Psychiatric:        Behavior: Behavior normal.     Comments: Well groomed, good eye contact, normal speech and thoughts      Diabetic Foot Exam - Simple   Simple Foot Form Diabetic Foot exam was performed with the following findings: Yes 02/07/2019  8:37 AM  Visual Inspection No deformities, no ulcerations, no other skin breakdown bilaterally: Yes Sensation Testing Intact to touch and monofilament testing bilaterally: Yes Pulse Check Posterior Tibialis and Dorsalis pulse intact bilaterally: Yes Comments     Results for orders placed or performed in visit on 01/17/19  PSA  Result Value Ref Range   Prostate Specific  Ag, Serum 0.9 0.0 - 4.0 ng/mL  Testosterone  Result Value Ref Range   Testosterone 516 264 - 916 ng/dL      Assessment & Plan:   Problem List Items Addressed This Visit    Type 2 diabetes mellitus with hyperglycemia, without long-term current use of insulin ([place]) - Primary    Previously - Significantly improved DM control A1c 7.6% in 10/2018 Concern with fatigue No hypoglycemia Complications - peripheral neuropathy and some hyperglycemia  Plan:  1. Continue current therapy - Trulicity 0.75mg  weekly injection GLP1, Jardiance 25mg  daily SGLT2, Metformin XR 500mg  BID 2. Encourage improved lifestyle - low carb, low sugar diet, reduce portion size, continue improving regular exercise 3. Check CBG , bring log to next visit for review 4. DM foot exam today  Unable to get  A1c POC x 2 today, will send for A1c lab in office vein draw. F/u results next week, discussed that if still very well controlled A1c < 7% I would agree to reduce or DC one of his DM medications for now see if polypharmacy affecting his overall fatigue. He will run out of jardiance in 2 days, he would like to hold that one for now, and see how his A1c result is, ultimately if higher A1c >7 he may be interested in referral to Endocrinology to discuss further.      Relevant Orders   POCT HgB A1C   [facility] - A1c LAB Hemoglobin A1C physical   Diabetic peripheral neuropathy associated with type 2 diabetes mellitus (HCC)   Relevant Medications   imipramine (TOFRANIL) 25 MG tablet   tiZANidine (ZANAFLEX) 4 MG tablet      #Fatigue See above Also considered Low T vs possible Sleep Apnea or other cause. He has not had symptoms of OSA as he denies snoring, apnea episodes or other signs. His last T was up to 500 and improved on treatment Clomid from urology  No orders of the defined types were placed in this encounter.    Follow up plan: Return in about 3 months (around 05/08/2019) for DM A1c.   Nobie Putnam, DO [facility] 02/07/2019, 8:32 AM

## 2019-02-07 NOTE — Patient Instructions (Addendum)
Thank you for coming to the office today.  A1c lab draw today. Stay tuned for result - likely get a call next week by Monday/Tuesday. Can stop jardiance when run out, and if A1c about 7 or less we can remain off of it as a trial.  If need we can re order and refer to [facility] to discuss Diabetes / Testosterone or other hormone cause of your symptoms.  Please schedule a Follow-up Appointment to: Return in about 3 months (around 05/08/2019) for DM A1c.  If you have any other questions or concerns, please feel free to call the office or send a message through [place]. You may also schedule an earlier appointment if necessary.  Additionally, you may be receiving a survey about your experience at our office within a few days to 1 week by e-mail or mail. We value your feedback.  Nobie Putnam, DO [place]

## 2019-02-08 LAB — HEMOGLOBIN A1C
Hgb A1c MFr Bld: 8.2 % of total Hgb — ABNORMAL HIGH (ref <5.7)
Mean Plasma Glucose: 189 (calc)
eAG (mmol/L): 10.4 (calc)

## 2019-02-19 ENCOUNTER — Other Ambulatory Visit: Payer: Self-pay | Admitting: Nurse Practitioner

## 2019-02-19 DIAGNOSIS — E1165 Type 2 diabetes mellitus with hyperglycemia: Secondary | ICD-10-CM

## 2019-05-10 ENCOUNTER — Ambulatory Visit: Payer: 59 | Admitting: Family Medicine

## 2019-05-10 ENCOUNTER — Other Ambulatory Visit: Payer: Self-pay

## 2019-05-10 ENCOUNTER — Encounter: Payer: Self-pay | Admitting: Family Medicine

## 2019-05-10 VITALS — BP 110/67 | HR 99 | Temp 97.3°F | Ht 72.0 in | Wt 221.2 lb

## 2019-05-10 DIAGNOSIS — R635 Abnormal weight gain: Secondary | ICD-10-CM

## 2019-05-10 DIAGNOSIS — E1165 Type 2 diabetes mellitus with hyperglycemia: Secondary | ICD-10-CM

## 2019-05-10 DIAGNOSIS — Z6830 Body mass index (BMI) 30.0-30.9, adult: Secondary | ICD-10-CM | POA: Diagnosis not present

## 2019-05-10 DIAGNOSIS — E663 Overweight: Secondary | ICD-10-CM

## 2019-05-10 LAB — CBC WITH DIFFERENTIAL/PLATELET
Absolute Monocytes: 466 cells/uL (ref 200–950)
Basophils Absolute: 32 cells/uL (ref 0–200)
Basophils Relative: 0.6 %
Eosinophils Absolute: 58 cells/uL (ref 15–500)
Eosinophils Relative: 1.1 %
HCT: 49.7 % (ref 38.5–50.0)
Hemoglobin: 16.2 g/dL (ref 13.2–17.1)
Lymphs Abs: 1489 cells/uL (ref 850–3900)
MCH: 28.9 pg (ref 27.0–33.0)
MCHC: 32.6 g/dL (ref 32.0–36.0)
MCV: 88.8 fL (ref 80.0–100.0)
MPV: 11.1 fL (ref 7.5–12.5)
Monocytes Relative: 8.8 %
Neutro Abs: 3254 cells/uL (ref 1500–7800)
Neutrophils Relative %: 61.4 %
Platelets: 227 10*3/uL (ref 140–400)
RBC: 5.6 10*6/uL (ref 4.20–5.80)
RDW: 12.5 % (ref 11.0–15.0)
Total Lymphocyte: 28.1 %
WBC: 5.3 10*3/uL (ref 3.8–10.8)

## 2019-05-10 LAB — COMPLETE METABOLIC PANEL WITH GFR
AG Ratio: 2 (calc) (ref 1.0–2.5)
ALT: 23 U/L (ref 9–46)
AST: 17 U/L (ref 10–40)
Albumin: 4.5 g/dL (ref 3.6–5.1)
Alkaline phosphatase (APISO): 66 U/L (ref 36–130)
BUN: 11 mg/dL (ref 7–25)
CO2: 25 mmol/L (ref 20–32)
Calcium: 9.4 mg/dL (ref 8.6–10.3)
Chloride: 107 mmol/L (ref 98–110)
Creat: 0.6 mg/dL (ref 0.60–1.35)
GFR, Est African American: 138 mL/min/{1.73_m2} (ref >=60)
GFR, Est Non African American: 119 mL/min/{1.73_m2} (ref >=60)
Globulin: 2.2 g/dL (calc) (ref 1.9–3.7)
Glucose, Bld: 192 mg/dL — ABNORMAL HIGH (ref 65–139)
Potassium: 4.5 mmol/L (ref 3.5–5.3)
Sodium: 140 mmol/L (ref 135–146)
Total Bilirubin: 0.4 mg/dL (ref 0.2–1.2)
Total Protein: 6.7 g/dL (ref 6.1–8.1)

## 2019-05-10 LAB — POCT URINALYSIS DIPSTICK
Bilirubin, UA: NEGATIVE
Blood, UA: NEGATIVE
Glucose, UA: POSITIVE — AB
Ketones, UA: NEGATIVE
Leukocytes, UA: NEGATIVE
Nitrite, UA: NEGATIVE
Protein, UA: NEGATIVE
Spec Grav, UA: 1.02 (ref 1.010–1.025)
Urobilinogen, UA: 0.2 E.U./dL
pH, UA: 5 (ref 5.0–8.0)

## 2019-05-10 LAB — LIPID PANEL
Cholesterol: 236 mg/dL — ABNORMAL HIGH (ref <200)
HDL: 55 mg/dL (ref >=40)
LDL Cholesterol (Calc): 160 mg/dL (calc) — ABNORMAL HIGH
Non-HDL Cholesterol (Calc): 181 mg/dL (calc) — ABNORMAL HIGH (ref <130)
Total CHOL/HDL Ratio: 4.3 (calc) (ref <5.0)
Triglycerides: 97 mg/dL (ref <150)

## 2019-05-10 LAB — POCT GLYCOSYLATED HEMOGLOBIN (HGB A1C): Hemoglobin A1C: 9.4 % — AB (ref 4.0–5.6)

## 2019-05-10 LAB — THYROID PANEL WITH TSH
Free Thyroxine Index: 2.3 (ref 1.4–3.8)
T3 Uptake: 38 % — ABNORMAL HIGH (ref 22–35)
T4, Total: 6 ug/dL (ref 4.9–10.5)
TSH: 0.81 mIU/L (ref 0.40–4.50)

## 2019-05-10 MED ORDER — METFORMIN HCL 850 MG PO TABS: 850.0000 mg | ORAL_TABLET | Freq: Two times a day (BID) | ORAL | 2 refills | Status: DC

## 2019-05-10 NOTE — Patient Instructions (Addendum)
As we discussed, have your labs drawn in the next 1-2 weeks and we will contact you with the results.  We have increased your Metformin from 500mg  XR twice daily to 850mg  twice daily  Advice on Protecting Your Feet   1. Daily foot inspections - Look for any breaks in the skin or areas of irritation such as blisters or red areas. Report to primary doctor or foot nurse immediately if any problems occur.  - If you have vision problems and cannot see your feet well or it is hard for you to reach your feet, ask a family member to inspect your feet daily.   2. Daily foot hygiene  - Wash feet daily, but do not soak your feet in hot water. If you do have callus, you may use warm water epsom salt foot soak and use moisturizer after. - Dry well especially between toes; Pat dry do not rub.  - If your skin is dry use a lotion to moisturize but never between the toes.  - If your skin is wet from perspiration, use an antifungal foot powder daily.   3. Shoes and Socks  - Wear a clean pair of socks daily.  - Make sure your shoes fit well and that you are measured and properly fit each time you purchase shoes. Your shoe size may change.  - Powder your shoes with a small amount of an antifungal foot powder daily, as the shoe is the only article of clothing that is not laundered.  - Wear appropriate shoes and socks for the weather. It is especially important to protect your feet from the cold; however, don't forget about sunscreen to the tops of your feet in the hot sun.  - Wear well fitting shoes rather than slippers or flip-flops when walking or standing for long period of time.  - When at home make sure you always have protective foot wear on your feet.     Eat at least 3 meals and 1-2 snacks per day (don't skip breakfast).  Aim for no more than 5 hours between eating. - Tip: If you go >5 hours without eating and become very hungry, your body will supply it's own resources temporarily and you can gain  extra weight when you eat.   The 5 Minute Rule of Exercise - Promise yourself to at least do 5 minutes of exercise (make sure you time it), and if at the end of 5 minutes (this is the hardest part of the work-out), if you still feel like you want stop (or not motivated to continue) then allow yourself to stop. Otherwise, more often than not you will feel encouraged that you can continue for a little while longer or even more!   My 5 to Fitness!  5: fruits and vegetables per day (work on 9 per day if you are at 5) 4: exercise 4-5 times per week for at least 30 minutes (walking counts!) 3: meals per day (don't skip breakfast!), no more than 5 hours between meals 2: habits to quit -smoking -excess alcohol use (men >2 beer/day; women >1beer/day) 1: sweet per day (2 cookies, 1 small cup of ice cream, 12 oz soda)  These are general tips for healthy living. Try to start with 1 or 2 habit TODAY and make it a part of your life for several months. You set a goal today to work on:  Once you have 1 or 2 habits down for several months, try to begin working on your  next healthy habit. With every single step you take, you will be leading a healthier lifestyle!   Diet Recommendations for Diabetes   Starchy (carb) foods include: Bread, rice, pasta, potatoes, corn, crackers, bagels, muffins, all baked goods.   Protein foods include: Meat, fish, poultry, eggs, dairy foods, and beans such as pinto and kidney beans (beans also provide carbohydrate).   1. Eat at least 3 meals and 1-2 snacks per day. Never go more than 4-5 hours while awake without eating.   2. Limit starchy foods to TWO per meal and ONE per snack. ONE portion of a starchy  food is equal to the following:   - ONE slice of bread (or its equivalent, such as half of a hamburger bun).   - 1/2 cup of a "scoopable" starchy food such as potatoes or rice.   - 1 OUNCE (28 grams) of starchy snacks (crackers or pretzels, look on label).   - 15  grams of carbohydrate as shown on food label.   3. Both lunch and dinner should include a protein food, a carb food, and vegetables.   - Obtain twice as many veg's as protein or carbohydrate foods for both lunch and dinner.   - Try to keep frozen veg's on hand for a quick vegetable serving.     - Fresh or frozen veg's are best.   4. Breakfast should always include protein.    You can learn more information online about your diabetes at American Diabetes Association: http://www.diabetes.org/ - General self-care (diet, medications, blood sugar checks). - Diet recommendations - There are even recipes available for you to look at and try.  Your provider would like to you have your annual eye exam. Please contact your current eye doctor or here are some good options for you to contact.   [facility]   Address: [address] [place], [place] [address] Phone: [phone]  Website: [email or web address] [address], [place], [place] [address] Phone: [phone] [email or web address]  [facility]  Address: [place], [place], [place] [address] Phone: [phone]   [facility] [address] [place], [place] [address] Phone: [phone]  [facility] Address: [place], [place], [place] [address]  Phone: [phone]  We will plan to see you back in 3 months for diabetes follow up and A1C  You will receive a survey after today's visit either digitally by e-mail or paper by [place] mail. Your experiences and feedback matter to [place].  Please respond so we know how we are doing as we provide care for you.  Call us with any questions/concerns/needs.  It is my goal to be available to you for your health concerns.  Thanks for choosing me to be a partner in your healthcare needs!  Harlin Rain, FNP-C Family Nurse Practitioner [facility] Phone: [phone]

## 2019-05-10 NOTE — Progress Notes (Signed)
Subjective:    Patient ID: Bruce Brandt, male    DOB: 03-18-1970, 49 y.o.   MRN: [id number]  Bruce Brandt is a 49 y.o. male presenting on 05/10/2019 for Diabetes (bs 180- 250 range )  HPI  Diabetes Pt presents today for follow up Type 2 Diabetes Mellitus.  He/she (caps): He is checking AM CBG at home with range of 180's-200's -Current diabetic medications include: Metformin 500 XR twice daily, jardiance 25mg  daily, and trulicity 0.75mg  weekly. -ACTION; IS/IS NOT: is not currently symptomatic -Actions; denies/reports/admits to: denies polydipsia, polyphagia, polyuria, headaches, diaphoresis, shakiness, chills, pain, numbness or tingling in extremities or changes in vision -Clinical course has been declining -Reports no set exercise routine -Diet is moderate in salt, moderate in fat, and moderate in carbohydrates  PREVENTION Eye exam current (within 1 year) DUE - list of locations provided Foot exam current (within 1 year) Completed today Lipid/ASCVD risk reduction - on statin: YES/NO: Yes  Kidney Protection (On ACE/ARB)? YES/NO: No    Depression screen [facility] 2/9 02/07/2019 01/03/2018  Decreased Interest 0 0  Down, Depressed, Hopeless 0 0  PHQ - 2 Score 0 0    Social History   Tobacco Use  . Smoking status: Never Smoker  . Smokeless tobacco: Never Used  Substance Use Topics  . Alcohol use: Yes    Comment: > 2 years ago had rare alcohol intake  . Drug use: No    Review of Systems  Constitutional: Negative.   HENT: Negative.   Eyes: Negative.   Respiratory: Negative.   Cardiovascular: Negative.   Gastrointestinal: Negative.   Endocrine: Negative.   Genitourinary: Negative.   Musculoskeletal: Negative.   Skin: Negative.   Allergic/Immunologic: Negative.   Neurological: Negative.   Hematological: Negative.   Psychiatric/Behavioral: Negative.    Per HPI unless specifically indicated above     Objective:    BP 110/67 (BP Location: Right Arm, Patient Position:  Sitting, Cuff Size: Large)   Pulse 99   Temp (!) 97.3 F (36.3 C) (Temporal)   Ht 6' (1.829 m)   Wt 221 lb 3.2 oz (100.3 kg)   BMI 30.00 kg/m   Wt Readings from Last 3 Encounters:  05/10/19 221 lb 3.2 oz (100.3 kg)  02/07/19 221 lb (100.2 kg)  11/23/18 215 lb (97.5 kg)    Physical Exam Vitals reviewed.  Constitutional:      General: He is not in acute distress.    Appearance: Normal appearance. He is well-groomed and normal weight. He is not ill-appearing or toxic-appearing.  HENT:     Head: Normocephalic.     Right Ear: Tympanic membrane, ear canal and external ear normal. There is no impacted cerumen.     Left Ear: Tympanic membrane, ear canal and external ear normal. There is no impacted cerumen.     Nose: Nose normal. No congestion or rhinorrhea.     Mouth/Throat:     Mouth: Mucous membranes are moist.     Pharynx: Oropharynx is clear. No oropharyngeal exudate or posterior oropharyngeal erythema.  Eyes:     General: Lids are normal. Vision grossly intact. No scleral icterus.       Right eye: No discharge or hordeolum.        Left eye: No discharge or hordeolum.     Extraocular Movements: Extraocular movements intact.     Conjunctiva/sclera: Conjunctivae normal.     Pupils: Pupils are equal, round, and reactive to light.  Neck:     Thyroid:  No thyroid mass, thyromegaly or thyroid tenderness.  Cardiovascular:     Rate and Rhythm: Normal rate and regular rhythm.     Pulses: Normal pulses.          Dorsalis pedis pulses are 2+ on the right side and 2+ on the left side.       Posterior tibial pulses are 2+ on the right side and 2+ on the left side.     Heart sounds: Normal heart sounds. No murmur. No friction rub. No gallop.   Pulmonary:     Effort: Pulmonary effort is normal. No respiratory distress.     Breath sounds: Normal breath sounds.  Abdominal:     General: Abdomen is flat. Bowel sounds are normal. There is no distension or abdominal bruit.     Palpations:  Abdomen is soft. There is no hepatomegaly, splenomegaly or mass.     Tenderness: There is no abdominal tenderness. There is no guarding or rebound.     Hernia: No hernia is present.  Musculoskeletal:        General: Normal range of motion.     Cervical back: Normal range of motion and neck supple. No tenderness.     Right lower leg: No edema.     Left lower leg: No edema.  Feet:     Right foot:     Skin integrity: Skin integrity normal.     Left foot:     Skin integrity: Skin integrity normal.     Comments: Absent of callus and all other lesions.  Some dry skin noted, but pt reports regular foot care and can see bottom of his feet.  Lymphadenopathy:     Cervical: No cervical adenopathy.  Skin:    General: Skin is warm and dry.     Capillary Refill: Capillary refill takes less than 2 seconds.  Neurological:     General: No focal deficit present.     Mental Status: He is alert and oriented to person, place, and time.     Cranial Nerves: Cranial nerves are intact.     Sensory: Sensation is intact.     Motor: Motor function is intact.     Coordination: Coordination is intact.     Gait: Gait is intact.     Deep Tendon Reflexes: Reflexes normal.  Psychiatric:        Attention and Perception: Attention and perception normal.        Mood and Affect: Mood and affect normal.        Speech: Speech normal.        Behavior: Behavior normal. Behavior is cooperative.        Thought Content: Thought content normal.        Cognition and Memory: Cognition and memory normal.        Judgment: Judgment normal.     Results for orders placed or performed in visit on 05/10/19  POCT glycosylated hemoglobin (Hb A1C)  Result Value Ref Range   Hemoglobin A1C 9.4 (A) 4.0 - 5.6 %   HbA1c POC (<> result, manual entry)     HbA1c, POC (prediabetic range)     HbA1c, POC (controlled diabetic range)    POCT Urinalysis Dipstick  Result Value Ref Range   Color, UA Yellow    Clarity, UA Clear    Glucose,  UA Positive (A) Negative   Bilirubin, UA negative    Ketones, UA negative    Spec Grav, UA 1.020 1.010 - 1.025  Blood, UA negative    pH, UA 5.0 5.0 - 8.0   Protein, UA Negative Negative   Urobilinogen, UA 0.2 0.2 or 1.0 E.U./dL   Nitrite, UA negative    Leukocytes, UA Negative Negative   Appearance     Odor        Assessment & Plan:   Problem List Items Addressed This Visit      Endocrine   Type 2 diabetes mellitus with hyperglycemia, without long-term current use of insulin (HCC) - Primary    UncontrolledDM with A1c 9.4% Worsening control from 8.2% and goal A1c < 7.0%. - No known complications or hypoglycemia.  Plan:  1. Change therapy: STOP Metformin 500XR twice daily and BEGIN Metformin 850mg  Twice daily, trulicity 0.75mg  weekly, and jardiance 25mg  daily 2. Encourage improved lifestyle: - low carb/low glycemic diet reinforced prior education - Increase physical activity to 30 minutes most days of the week.  Explained that increased physical activity increases body's use of sugar for energy. 3. Check fasting am CBG and log these.  Bring log to next visit for review 4. Continue Statin 5. DM Foot exam done today with no acute findings.   and Advised to schedule DM ophtho exam, send record. 6. Follow-up 3 months      Relevant Medications   metFORMIN (GLUCOPHAGE) 850 MG tablet   Other Relevant Orders   POCT glycosylated hemoglobin (Hb A1C) (Completed)   POCT Urinalysis Dipstick (Completed)   Microalbumin, urine   CBC with Differential   COMPLETE METABOLIC PANEL WITH GFR   Lipid Profile     Other   Overweight (BMI 25.0-29.9)    Other Visit Diagnoses    BMI 30.0-30.9,adult       Relevant Orders   CBC with Differential   COMPLETE METABOLIC PANEL WITH GFR   Lipid Profile   Weight gain       Relevant Orders   Lipid Profile   Thyroid Panel With TSH      Meds ordered this encounter  Medications  . metFORMIN (GLUCOPHAGE) 850 MG tablet    Sig: Take 1 tablet (850  mg total) by mouth 2 (two) times daily with a meal.    Dispense:  180 tablet    Refill:  2      Follow up plan: Return in about 3 months (around 08/09/2019) for A1C/DM F/U.   08-08-1996, FNP Family Nurse Practitioner [facility] 05/10/2019, 9:56 AM

## 2019-05-10 NOTE — Assessment & Plan Note (Signed)
UncontrolledDM with A1c 9.4% Worsening control from 8.2% and goal A1c < 7.0%. - No known complications or hypoglycemia.  Plan:  1. Change therapy: STOP Metformin 500XR twice daily and BEGIN Metformin 850mg  Twice daily, trulicity 0.75mg  weekly, and jardiance 25mg  daily 2. Encourage improved lifestyle: - low carb/low glycemic diet reinforced prior education - Increase physical activity to 30 minutes most days of the week.  Explained that increased physical activity increases body's use of sugar for energy. 3. Check fasting am CBG and log these.  Bring log to next visit for review 4. Continue Statin 5. DM Foot exam done today with no acute findings.   and Advised to schedule DM ophtho exam, send record. 6. Follow-up 3 months

## 2019-05-11 LAB — MICROALBUMIN, URINE: Microalb, Ur: 0.2 mg/dL

## 2019-05-14 ENCOUNTER — Other Ambulatory Visit: Payer: Self-pay | Admitting: Family Medicine

## 2019-05-14 DIAGNOSIS — E1165 Type 2 diabetes mellitus with hyperglycemia: Secondary | ICD-10-CM

## 2019-05-14 MED ORDER — ATORVASTATIN CALCIUM 10 MG PO TABS: 10.0000 mg | ORAL_TABLET | Freq: Every day | ORAL | 0 refills | Status: DC

## 2019-05-14 NOTE — Progress Notes (Signed)
He can certainly chose to take it once per week.  He may benefit from taking Vascepa in addition to the atorvastatin if he is unable to tolerate more than once per week dosing.  Let me know if he decides this.

## 2019-05-14 NOTE — Progress Notes (Signed)
Labs are similar to 1 year ago.  Cholesterol labs are slightly better than they were 6 months ago.  His med list shows he is taking Atorvastatin once weekly.  He should be taking this daily.  I am going to send this over to his pharmacy.  We can plan to see him back at his regularly scheduled visit in 3 months.

## 2019-08-08 ENCOUNTER — Other Ambulatory Visit: Payer: Self-pay | Admitting: Family Medicine

## 2019-08-08 DIAGNOSIS — E1165 Type 2 diabetes mellitus with hyperglycemia: Secondary | ICD-10-CM

## 2019-08-08 NOTE — Telephone Encounter (Signed)
Requested Prescriptions  Pending Prescriptions Disp Refills  . TRULICITY 0.75 MG/0.5ML SOPN [Pharmacy Med Name: TRULICITY 0.75 MG/0.5 ML PEN] 12 pen 0    Sig: INJECT 0.75 MG INTO THE SKIN EVERY 7 DAYS.     Endocrinology:  Diabetes - GLP-1 Receptor Agonists Failed - 08/08/2019  1:30 AM      Failed - HBA1C is between 0 and 7.9 and within 180 days    Hemoglobin A1C  Date Value Ref Range Status  05/10/2019 9.4 (A) 4.0 - 5.6 % Final   Hgb A1c MFr Bld  Date Value Ref Range Status  02/07/2019 8.2 (H) <5.7 % of total Hgb Final    Comment:    For someone without known diabetes, a hemoglobin A1c value of 6.5% or greater indicates that they may have  diabetes and this should be confirmed with a follow-up  test. . For someone with known diabetes, a value <7% indicates  that their diabetes is well controlled and a value  greater than or equal to 7% indicates suboptimal  control. A1c targets should be individualized based on  duration of diabetes, age, comorbid conditions, and  other considerations. . Currently, no consensus exists regarding use of hemoglobin A1c for diagnosis of diabetes for children. Verna Czech - Valid encounter within last 6 months    Recent Outpatient Visits          3 months ago Type 2 diabetes mellitus with hyperglycemia, without long-term current use of insulin (HCC)   [facility], Jodelle Gross, FNP   6 months ago Type 2 diabetes mellitus with hyperglycemia, without long-term current use of insulin [facility])   [facility], Netta Neat, DO   9 months ago Type 2 diabetes mellitus with hyperglycemia, without long-term current use of insulin [facility])   [facility], Alison Stalling, NP   1 year ago Type 2 diabetes mellitus with hyperglycemia, without long-term current use of insulin [facility])   [facility] Smitty Cords, DO   1 year ago Type 2 diabetes mellitus with  hyperglycemia, without long-term current use of insulin [facility])   [facility] Kyung Rudd, Alison Stalling, NP      Future Appointments            In 6 days Malfi, Jodelle Gross, FNP [facility], [facility]

## 2019-08-11 ENCOUNTER — Other Ambulatory Visit: Payer: Self-pay | Admitting: Family Medicine

## 2019-08-11 DIAGNOSIS — E1165 Type 2 diabetes mellitus with hyperglycemia: Secondary | ICD-10-CM

## 2019-08-11 NOTE — Telephone Encounter (Signed)
Requested Prescriptions  Pending Prescriptions Disp Refills  . atorvastatin (LIPITOR) 10 MG tablet [Pharmacy Med Name: ATORVASTATIN 10 MG TABLET] 90 tablet 0    Sig: TAKE 1 TABLET BY MOUTH EVERY DAY     Cardiovascular:  Antilipid - Statins Failed - 08/11/2019 10:17 AM      Failed - Total Cholesterol in normal range and within 360 days    Cholesterol  Date Value Ref Range Status  05/10/2019 236 (H) <200 mg/dL Final         Failed - LDL in normal range and within 360 days    LDL Cholesterol (Calc)  Date Value Ref Range Status  05/10/2019 160 (H) mg/dL (calc) Final    Comment:    Reference range: <100 . Desirable range <100 mg/dL for primary prevention;   <70 mg/dL for patients with CHD or diabetic patients  with > or = 2 CHD risk factors. Marland Kitchen LDL-C is now calculated using the Martin-Hopkins  calculation, which is a validated novel method providing  better accuracy than the Friedewald equation in the  estimation of LDL-C.  Horald Pollen et al. Lenox Ahr. 7711;657(90): 2061-2068  (http://education.[employer].com/faq/FAQ164)          Passed - HDL in normal range and within 360 days    HDL  Date Value Ref Range Status  05/10/2019 55 > OR = 40 mg/dL Final         Passed - Triglycerides in normal range and within 360 days    Triglycerides  Date Value Ref Range Status  05/10/2019 97 <150 mg/dL Final         Passed - Patient is not pregnant      Passed - Valid encounter within last 12 months    Recent Outpatient Visits          3 months ago Type 2 diabetes mellitus with hyperglycemia, without long-term current use of insulin (HCC)   [facility], Jodelle Gross, FNP   6 months ago Type 2 diabetes mellitus with hyperglycemia, without long-term current use of insulin [facility])   [facility], Netta Neat, DO   9 months ago Type 2 diabetes mellitus with hyperglycemia, without long-term current use of insulin [facility])   [facility]  Kyung Rudd, Alison Stalling, NP   1 year ago Type 2 diabetes mellitus with hyperglycemia, without long-term current use of insulin [facility])   [facility] Smitty Cords, DO   1 year ago Type 2 diabetes mellitus with hyperglycemia, without long-term current use of insulin [facility])   [facility] Kyung Rudd, Alison Stalling, NP      Future Appointments            In 3 days Malfi, Jodelle Gross, FNP [facility], [facility]

## 2019-08-14 ENCOUNTER — Other Ambulatory Visit: Payer: Self-pay | Admitting: Family Medicine

## 2019-08-14 ENCOUNTER — Ambulatory Visit: Payer: 59 | Admitting: Family Medicine

## 2019-08-14 ENCOUNTER — Other Ambulatory Visit: Payer: Self-pay

## 2019-08-14 ENCOUNTER — Encounter: Payer: Self-pay | Admitting: Family Medicine

## 2019-08-14 ENCOUNTER — Telehealth: Payer: Self-pay | Admitting: Family Medicine

## 2019-08-14 VITALS — BP 121/74 | HR 101 | Temp 97.1°F | Ht 72.0 in | Wt 215.0 lb

## 2019-08-14 DIAGNOSIS — E1165 Type 2 diabetes mellitus with hyperglycemia: Secondary | ICD-10-CM | POA: Diagnosis not present

## 2019-08-14 LAB — POCT GLYCOSYLATED HEMOGLOBIN (HGB A1C): Hemoglobin A1C: 9.1 % — AB (ref 4.0–5.6)

## 2019-08-14 MED ORDER — METFORMIN HCL 850 MG PO TABS: 850.0000 mg | ORAL_TABLET | Freq: Two times a day (BID) | ORAL | 2 refills | Status: DC

## 2019-08-14 MED ORDER — TRULICITY 0.75 MG/0.5ML ~~LOC~~ SOAJ: SUBCUTANEOUS | 0 refills | Status: DC

## 2019-08-14 MED ORDER — EMPAGLIFLOZIN 25 MG PO TABS: 25.0000 mg | ORAL_TABLET | Freq: Every day | ORAL | 1 refills | Status: DC

## 2019-08-14 NOTE — Progress Notes (Signed)
Subjective:    Patient ID: Bruce Brandt, male    DOB: 1970/03/01, 49 y.o.   MRN: [id number]  Bruce Brandt is a 49 y.o. male presenting on 08/14/2019 for Diabetes   HPI   Diabetes Pt presents today for follow up Type 2 Diabetes Mellitus.  He/she (caps): He ACTION; IS/IS NOT: is not checking AM CBG at home recently.  Has been having issues with his Josephine Igo coming off of his arm when he changes shirts. -Current diabetic medications include: jardiance 25mg  daily, metformin 850mg  BID and trulicity 0.75 subcutaneously weekly -ACTION; IS/IS NOT: is not currently symptomatic -Actions; denies/reports/admits to: denies polydipsia, polyphagia, polyuria, headaches, diaphoresis, shakiness, chills, pain, numbness or tingling in extremities or changes in vision -Clinical course has been improving  -Reports no structured exercise routine -Diet is moderate in salt, moderate in fat, and moderate in carbohydrates  PREVENTION Eye exam current (within 1 year) Has appointment 08/18/2019 with [facility] Foot exam current (within 1 year) Yes Lipid/ASCVD risk reduction - on statin: YES/NO: Yes  Kidney Protection (On ACE/ARB)? YES/NO: Discussed, will think about this   Bruce Brandt has acute concerns of if he can wear the Freestyle Libre in any other location besides the back of his arms.  Has found that he is having it catch on clothing when changing and dislodge from his arm.  Depression screen [facility] 2/9 02/07/2019 01/03/2018  Decreased Interest 0 0  Down, Depressed, Hopeless 0 0  PHQ - 2 Score 0 0    Social History   Tobacco Use  . Smoking status: Never Smoker  . Smokeless tobacco: Never Used  Vaping Use  . Vaping Use: Never used  Substance Use Topics  . Alcohol use: Yes    Comment: > 2 years ago had rare alcohol intake  . Drug use: No    Review of Systems  Constitutional: Negative.   HENT: Negative.   Eyes: Negative.   Respiratory: Negative.   Cardiovascular: Negative.   Gastrointestinal:  Negative.   Endocrine: Negative.   Genitourinary: Negative.   Musculoskeletal: Negative.   Skin: Negative.   Allergic/Immunologic: Negative.   Neurological: Negative.   Hematological: Negative.   Psychiatric/Behavioral: Negative.    Per HPI unless specifically indicated above     Objective:    BP 121/74 (BP Location: Left Arm, Patient Position: Sitting, Cuff Size: Large)   Pulse (!) 101   Temp (!) 97.1 F (36.2 C) (Temporal)   Ht 6' (1.829 m)   Wt 215 lb (97.5 kg)   BMI 29.16 kg/m   Wt Readings from Last 3 Encounters:  08/14/19 215 lb (97.5 kg)  05/10/19 221 lb 3.2 oz (100.3 kg)  02/07/19 221 lb (100.2 kg)    Physical Exam Vitals reviewed.  Constitutional:      General: He is not in acute distress.    Appearance: Normal appearance. He is well-developed, well-groomed and overweight. He is not ill-appearing or toxic-appearing.  HENT:     Head: Normocephalic and atraumatic.     Nose:     Comments: 07/10/19 is in place, covering mouth and nose  Eyes:     General: Lids are normal. Vision grossly intact. No scleral icterus.       Right eye: No discharge or hordeolum.        Left eye: No discharge or hordeolum.     Extraocular Movements: Extraocular movements intact.     Conjunctiva/sclera: Conjunctivae normal.     Pupils: Pupils are equal, round, and reactive  to light.  Neck:     Thyroid: No thyroid mass, thyromegaly or thyroid tenderness.  Cardiovascular:     Rate and Rhythm: Normal rate and regular rhythm.     Pulses: Normal pulses.          Dorsalis pedis pulses are 2+ on the right side and 2+ on the left side.       Posterior tibial pulses are 2+ on the right side and 2+ on the left side.     Heart sounds: Normal heart sounds. No murmur heard.  No friction rub. No gallop.   Pulmonary:     Effort: Pulmonary effort is normal. No respiratory distress.     Breath sounds: Normal breath sounds.  Abdominal:     General: There is no abdominal bruit.     Palpations:  There is no hepatomegaly or splenomegaly.  Musculoskeletal:        General: Normal range of motion.     Right lower leg: No edema.     Left lower leg: No edema.  Feet:     Right foot:     Skin integrity: Skin integrity normal.     Left foot:     Skin integrity: Skin integrity normal.     Comments: Absent of callus and all other lesions.  Some dry skin noted, but pt reports regular foot care and can see bottom of his feet.  Skin:    General: Skin is warm and dry.     Capillary Refill: Capillary refill takes less than 2 seconds.  Neurological:     General: No focal deficit present.     Mental Status: He is alert and oriented to person, place, and time.  Psychiatric:        Attention and Perception: Attention and perception normal.        Mood and Affect: Mood and affect normal.        Speech: Speech normal.        Behavior: Behavior normal. Behavior is cooperative.        Thought Content: Thought content normal.        Cognition and Memory: Cognition and memory normal.        Judgment: Judgment normal.    Results for orders placed or performed in visit on 08/14/19  POCT glycosylated hemoglobin (Hb A1C)  Result Value Ref Range   Hemoglobin A1C 9.1 (A) 4.0 - 5.6 %   HbA1c POC (<> result, manual entry)     HbA1c, POC (prediabetic range)     HbA1c, POC (controlled diabetic range)        Assessment & Plan:   Problem List Items Addressed This Visit      Endocrine   Type 2 diabetes mellitus with hyperglycemia, without long-term current use of insulin (HCC) - Primary    UncontrolledDM with A1c 9.1% improved from 9.4% on 05/10/2019 and goal A1c < 7.0%. - Complications - hyperglycemia.  Plan:  1. Continue current therapy: jardiance 25mg  daily, metformin 850mg  BID and trulicity 0.75mg  SQ weekly 2. Encourage improved lifestyle: - low carb/low glycemic diet reinforced prior education - Increase physical activity to 30 minutes most days of the week.  Explained that increased physical  activity increases body's use of sugar for energy. 3. Check fasting am CBG and log these.  Bring log to next visit for review 4. Continue Statin 5. Advised to schedule DM ophtho exam, send record. 6. Follow-up 3 months       Relevant Medications  metFORMIN (GLUCOPHAGE) 850 MG tablet   empagliflozin (JARDIANCE) 25 MG TABS tablet   Dulaglutide (TRULICITY) 0.75 MG/0.5ML SOPN   Other Relevant Orders   POCT glycosylated hemoglobin (Hb A1C) (Completed)      Meds ordered this encounter  Medications  . metFORMIN (GLUCOPHAGE) 850 MG tablet    Sig: Take 1 tablet (850 mg total) by mouth 2 (two) times daily with a meal.    Dispense:  180 tablet    Refill:  2  . empagliflozin (JARDIANCE) 25 MG TABS tablet    Sig: Take 1 tablet (25 mg total) by mouth daily.    Dispense:  90 tablet    Refill:  1  . Dulaglutide (TRULICITY) 0.75 MG/0.5ML SOPN    Sig: INJECT 0.75 MG INTO THE SKIN EVERY 7 DAYS.    Dispense:  12 pen    Refill:  0      Follow up plan: Return in about 3 months (around 11/14/2019) for T2DM F/U, A1C.   Charlaine Dalton, FNP Family Nurse Practitioner [facility] 08/14/2019, 8:44 AM

## 2019-08-14 NOTE — Assessment & Plan Note (Signed)
UncontrolledDM with A1c 9.1% improved from 9.4% on 05/10/2019 and goal A1c < 7.0%. - Complications - hyperglycemia.  Plan:  1. Continue current therapy: jardiance 25mg  daily, metformin 850mg  BID and trulicity 0.75mg  SQ weekly 2. Encourage improved lifestyle: - low carb/low glycemic diet reinforced prior education - Increase physical activity to 30 minutes most days of the week.  Explained that increased physical activity increases body's use of sugar for energy. 3. Check fasting am CBG and log these.  Bring log to next visit for review 4. Continue Statin 5. Advised to schedule DM ophtho exam, send record. 6. Follow-up 3 months

## 2019-08-14 NOTE — Telephone Encounter (Signed)
Telephone call to Bruce Brandt to review placement of Libre.  Has been having difficulty with changing his shirt and the libre detaching.  Discussed FDA approval is for the upper arm but there have been patients that use on the abdomen or upper thigh if unable to use on the back of the arm.  Patient verbalized understanding and denied any additional questions/concerns/needs.

## 2019-08-14 NOTE — Patient Instructions (Signed)
As we discussed, continue all medications as directed.  I will follow up with the endocrine providers to gather more information about locations to wear the Josephine Igo besides the back of the upper arm and will contact you with this information.  Please have [facility] send [place] a copy of your diabetic eye exam to Fax# [phone] and we can update into your chart.  Can begin to think about starting on low dose ACEi/ARB for kidney protection.  We can discuss this again at your next visit.  We will plan to see you back in 3 months for diabetes follow up visit  You will receive a survey after today's visit either digitally by e-mail or paper by USPS mail. Your experiences and feedback matter to [place].  Please respond so we know how we are doing as we provide care for you.  Call us with any questions/concerns/needs.  It is my goal to be available to you for your health concerns.  Thanks for choosing me to be a partner in your healthcare needs!  Charlaine Dalton, FNP-C Family Nurse Practitioner [facility] Phone: [phone]

## 2019-08-14 NOTE — Telephone Encounter (Signed)
Requested Prescriptions  Pending Prescriptions Disp Refills   Continuous Blood Gluc Sensor (FREESTYLE LIBRE 14 DAY SENSOR) MISC [Pharmacy Med Name: FREESTYLE LIBRE 14 DAY SENSOR] 7 each 1    Sig: USE AS DIRECTED TO CHECK BLOOD GLUCOSE. CHANGE EVERY 14 DAYS     Endocrinology: Diabetes - Testing Supplies Passed - 08/14/2019  6:32 PM      Passed - Valid encounter within last 12 months    Recent Outpatient Visits          Today Type 2 diabetes mellitus with hyperglycemia, without long-term current use of insulin (HCC)   [facility], Jodelle Gross, FNP   3 months ago Type 2 diabetes mellitus with hyperglycemia, without long-term current use of insulin [facility])   [facility], Jodelle Gross, FNP   6 months ago Type 2 diabetes mellitus with hyperglycemia, without long-term current use of insulin [facility])   [facility], Netta Neat, DO   10 months ago Type 2 diabetes mellitus with hyperglycemia, without long-term current use of insulin [facility])   [facility] Kyung Rudd, Alison Stalling, NP   1 year ago Type 2 diabetes mellitus with hyperglycemia, without long-term current use of insulin [facility])   [facility] Smitty Cords, DO      Future Appointments            In 3 months Malfi, Jodelle Gross, FNP [facility], [facility]

## 2019-08-18 LAB — HM DIABETES EYE EXAM

## 2019-10-01 IMAGING — CT CT HEAD W/O CM
3 series · 16 of 47 positions shown, 19 images · non-contrast
Comparison: None.

CLINICAL DATA: Worst headache of life starting yesterday

EXAM:
CT HEAD WITHOUT CONTRAST
TECHNIQUE: Contiguous axial images were obtained from the base of the skull
through the vertex without intravenous contrast.

[Series 2: head wo · axial · 0.47mm/px · z∈[-154,-19]mm · 10 of 33 slices shown, 13 images]
[im 3/33  brain]
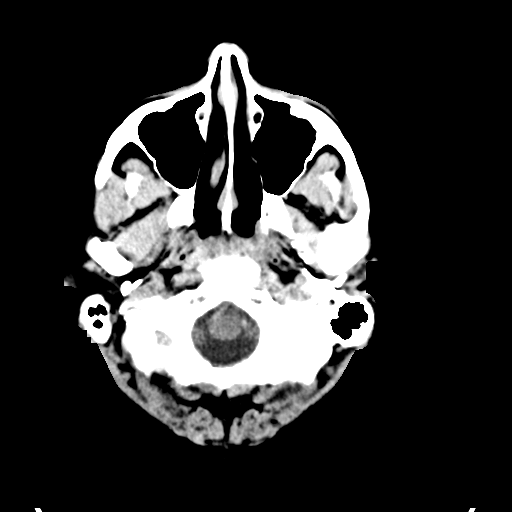
[im 3/33  bone]
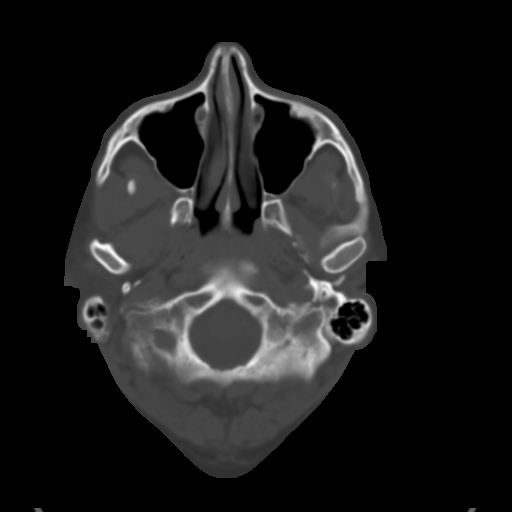
[im 6/33  brain]
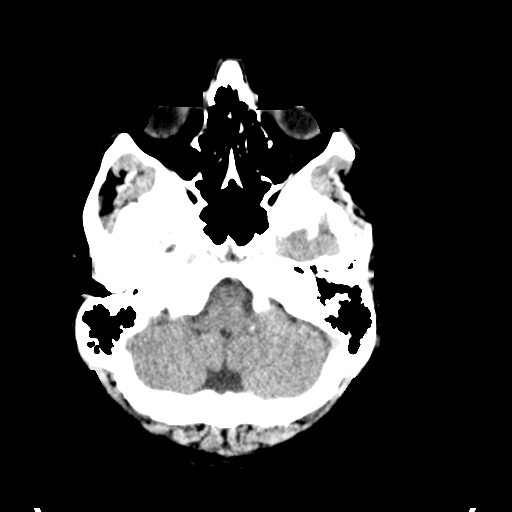
[im 9/33  brain]
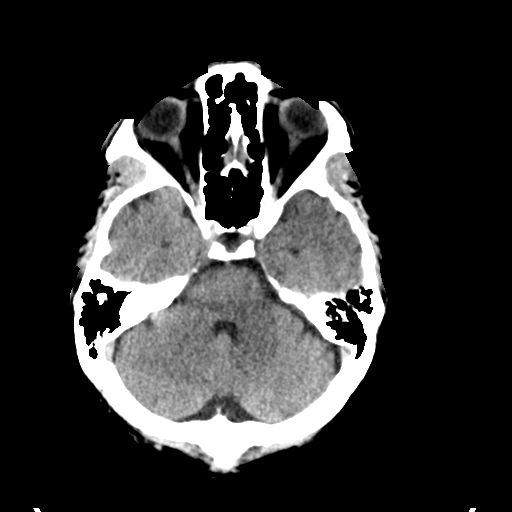
[im 12/33  brain]
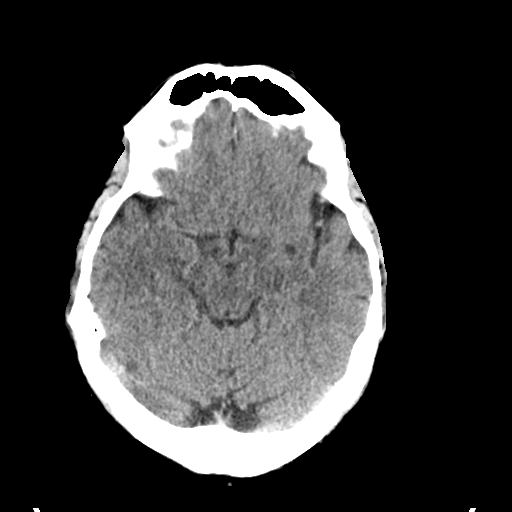
[im 15/33  brain]
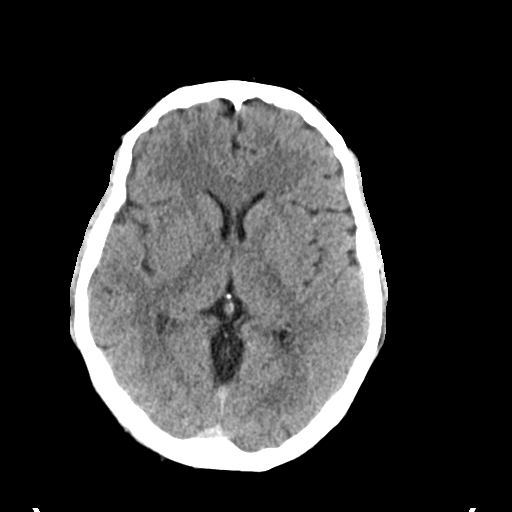
[im 15/33  bone]
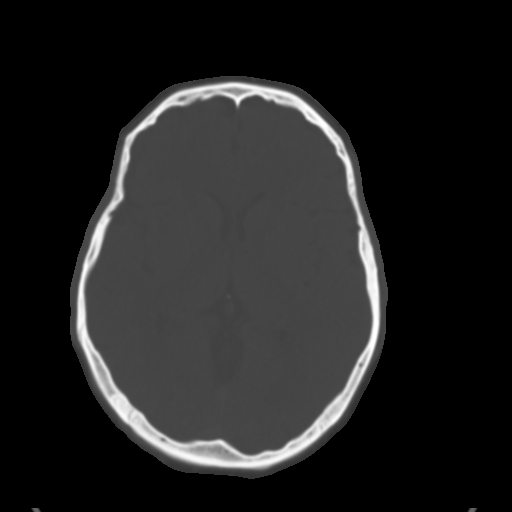
[im 18/33  brain]
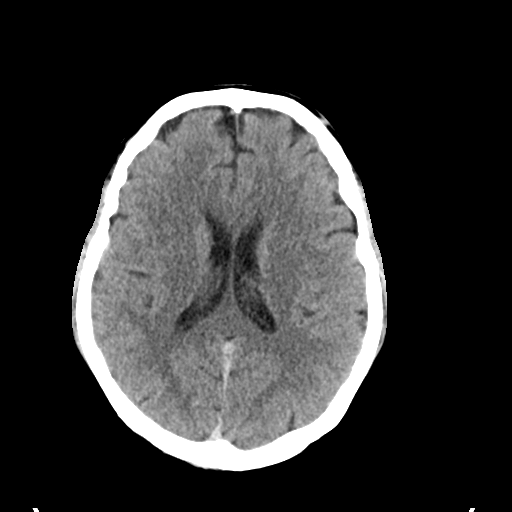
[im 21/33  brain]
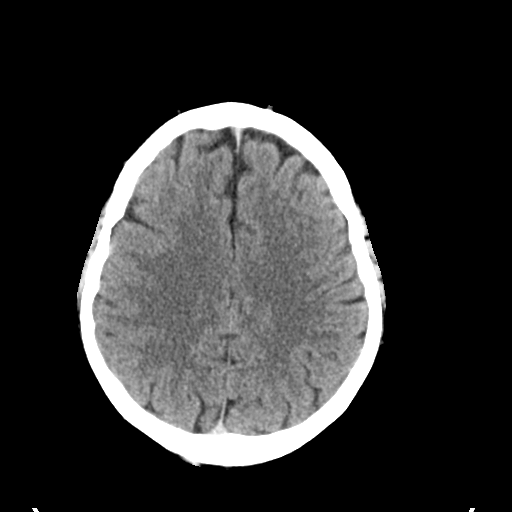
[im 25/33  brain]
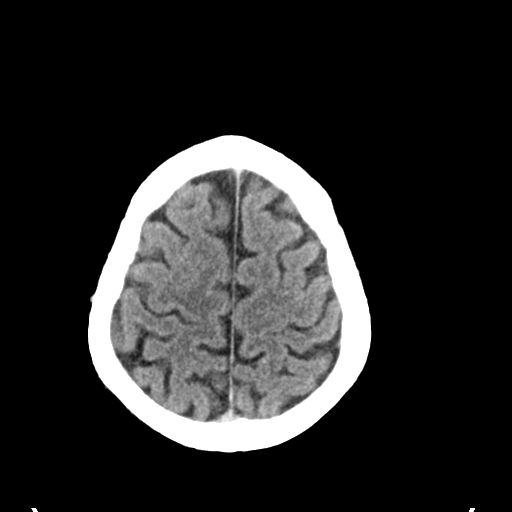
[im 27/33  brain]
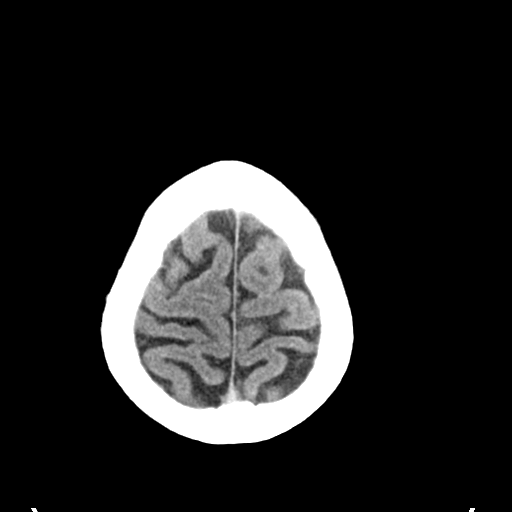
[im 27/33  bone]
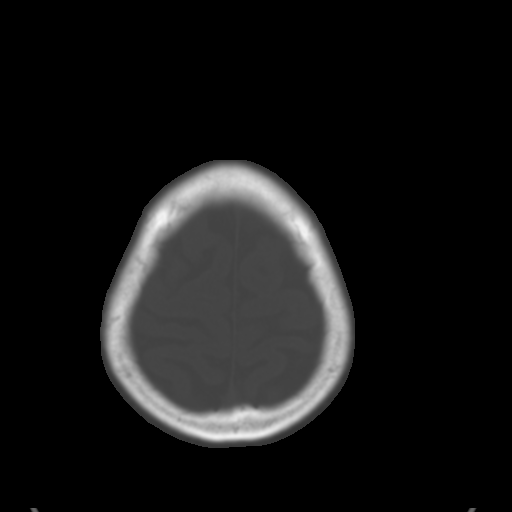
[im 30/33  brain]
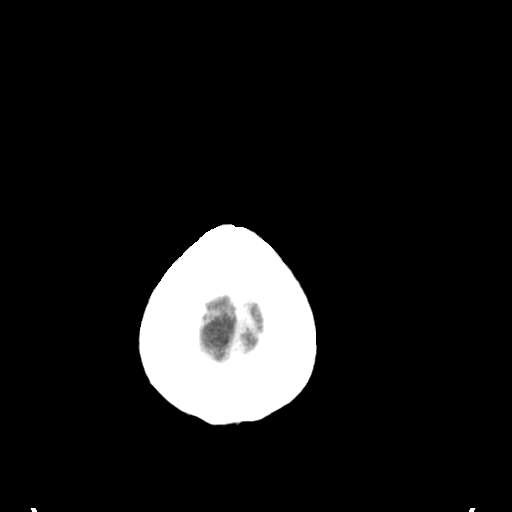

[Series 4: coronal soft tissue · coronal · 0.31mm/px · 3 of 73 slices shown]
[im 25/73  brain]
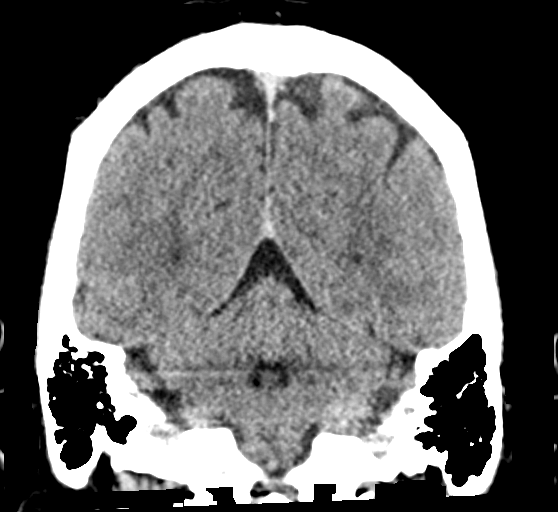
[im 33/73  brain]
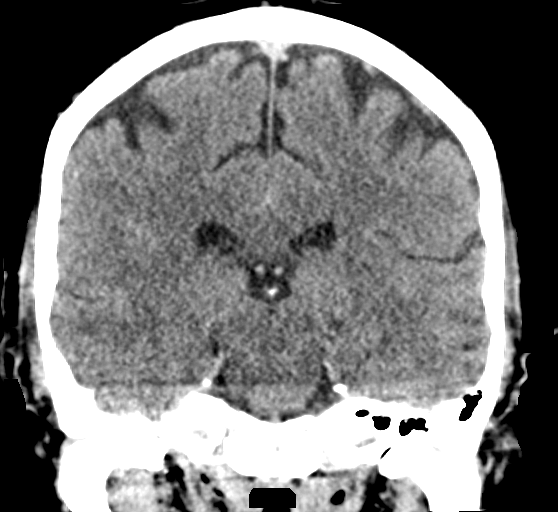
[im 41/73  brain]
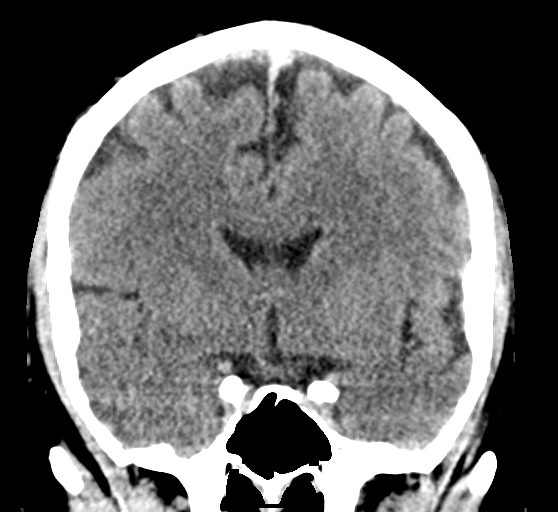

[Series 5: sagittal soft tissue · sagittal · 0.31mm/px · 3 of 58 slices shown]
[im 20/58  brain]
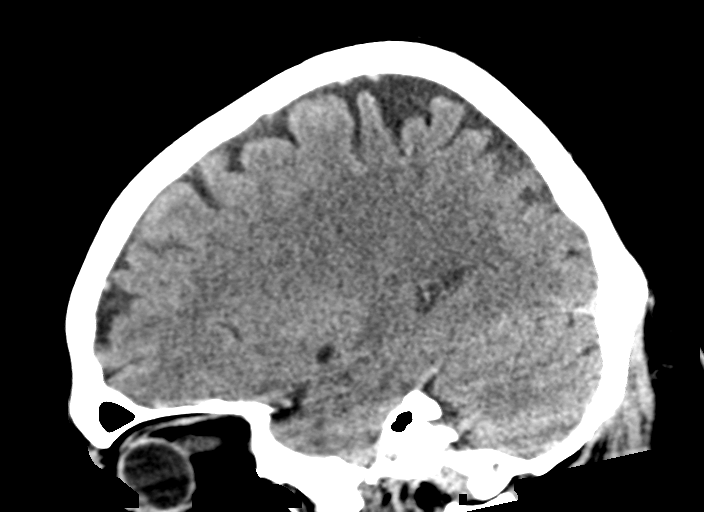
[im 29/58  brain]
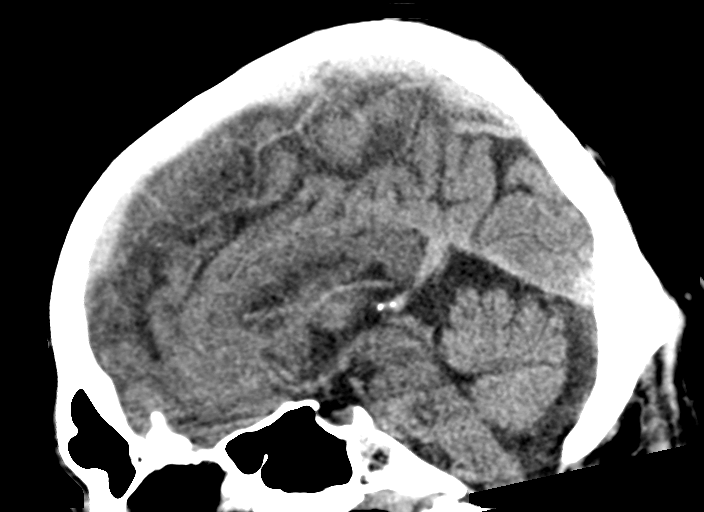
[im 39/58  brain]
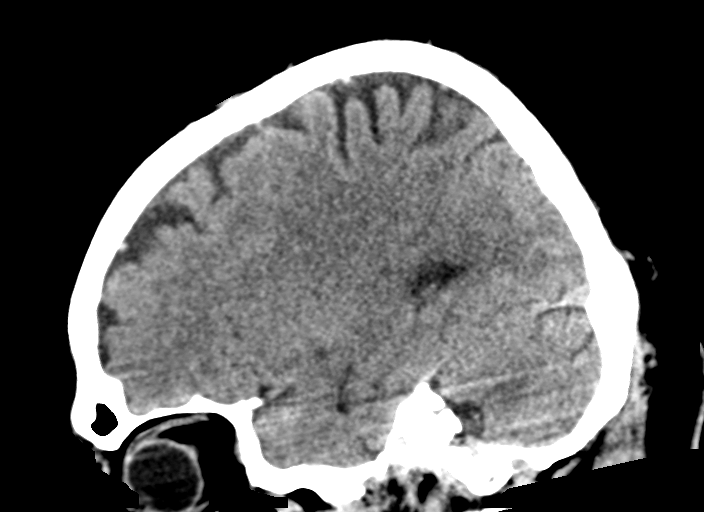

[16 of 47 positions shown; findings below may reference images not displayed]

FINDINGS: Brain: No evidence of acute infarction, hemorrhage, hydrocephalus,
extra-axial collection or mass lesion/mass effect.

Vascular: No hyperdense vessel or unexpected calcification.

Skull: Normal. Negative for fracture or focal lesion.

Sinuses/Orbits: No acute finding.
IMPRESSION: Negative head CT.

## 2019-10-03 ENCOUNTER — Other Ambulatory Visit: Payer: Self-pay

## 2019-10-03 ENCOUNTER — Encounter: Payer: Self-pay | Admitting: Family Medicine

## 2019-10-03 ENCOUNTER — Ambulatory Visit: Payer: 59 | Admitting: Family Medicine

## 2019-10-03 VITALS — BP 128/71 | HR 100 | Temp 98.4°F | Ht 72.0 in | Wt 216.2 lb

## 2019-10-03 DIAGNOSIS — U071 COVID-19: Secondary | ICD-10-CM | POA: Diagnosis not present

## 2019-10-03 DIAGNOSIS — R238 Other skin changes: Secondary | ICD-10-CM

## 2019-10-03 DIAGNOSIS — E1165 Type 2 diabetes mellitus with hyperglycemia: Secondary | ICD-10-CM | POA: Diagnosis not present

## 2019-10-03 DIAGNOSIS — E1142 Type 2 diabetes mellitus with diabetic polyneuropathy: Secondary | ICD-10-CM | POA: Diagnosis not present

## 2019-10-03 MED ORDER — HYDROCORTISONE 2 % EX LOTN: 1.0000 "application " | TOPICAL_LOTION | Freq: Two times a day (BID) | CUTANEOUS | 1 refills | Status: DC | PRN

## 2019-10-03 NOTE — Assessment & Plan Note (Signed)
Discussed importance of booster vaccine for COVID given his diagnosis of T2DM.  Patient in agreement.  See COVID toes A/P

## 2019-10-03 NOTE — Progress Notes (Signed)
Subjective:    Patient ID: Bruce Brandt, male    DOB: 09-12-1970, 49 y.o.   MRN: [id number]  Bruce Brandt is a 49 y.o. male presenting on 10/03/2019 for COVID toes (bilateral toenail discoloration w/ no discomfort post covid vaccine x 3 mths ago. He notice the discoloration x1 week after his first COVID vaccine.)   HPI  Bruce Brandt presents to clinic for evaluation of toenail discoloration.  Reports this had started after his COVID vaccination > 3 months ago.  Had first noticed the discoloration approximately 1 week after his COVID vaccine.  Has had progression of color change in right great toe and has noticed the left great toenail has not gotten worse, but is growing out.  Denies any pain, difficulty with ambulation, ulceration or blistering.  Depression screen [facility] 2/9 02/07/2019 01/03/2018  Decreased Interest 0 0  Down, Depressed, Hopeless 0 0  PHQ - 2 Score 0 0    Social History   Tobacco Use  . Smoking status: Never Smoker  . Smokeless tobacco: Never Used  Vaping Use  . Vaping Use: Never used  Substance Use Topics  . Alcohol use: Yes    Comment: > 2 years ago had rare alcohol intake  . Drug use: No    Review of Systems  Constitutional: Negative.   HENT: Negative.   Eyes: Negative.   Respiratory: Negative.   Cardiovascular: Negative.   Gastrointestinal: Negative.   Endocrine: Negative.   Genitourinary: Negative.   Musculoskeletal: Negative.   Skin: Negative.        Bilateral great toenail color changes  Allergic/Immunologic: Negative.   Neurological: Negative.   Hematological: Negative.   Psychiatric/Behavioral: Negative.    Per HPI unless specifically indicated above     Objective:    BP 128/71 (BP Location: Left Arm, Patient Position: Sitting, Cuff Size: Large)   Pulse 100   Temp 98.4 F (36.9 C) (Oral)   Ht 6' (1.829 m)   Wt 216 lb 3.2 oz (98.1 kg)   BMI 29.32 kg/m   Wt Readings from Last 3 Encounters:  10/03/19 216 lb 3.2 oz (98.1 kg)  08/14/19  215 lb (97.5 kg)  05/10/19 221 lb 3.2 oz (100.3 kg)    Physical Exam Vitals reviewed.  Constitutional:      General: He is not in acute distress.    Appearance: Normal appearance. He is well-developed, well-groomed and overweight. He is not ill-appearing or toxic-appearing.  HENT:     Head: Normocephalic and atraumatic.     Nose:     Comments: Lesia Sago is in place, covering mouth and nose. Eyes:     General:        Right eye: No discharge.        Left eye: No discharge.     Extraocular Movements: Extraocular movements intact.     Conjunctiva/sclera: Conjunctivae normal.     Pupils: Pupils are equal, round, and reactive to light.  Cardiovascular:     Pulses: Normal pulses.  Pulmonary:     Effort: Pulmonary effort is normal. No respiratory distress.  Musculoskeletal:     Right lower leg: No edema.     Left lower leg: No edema.  Feet:     Right foot:     Skin integrity: Skin integrity normal.     Left foot:     Skin integrity: Skin integrity normal.     Comments: Bilateral great toenails with purple discoloration.  Left appears contained and with new nail  growth.  Right great toenail with color change progression towards matrix. Skin:    General: Skin is warm and dry.     Capillary Refill: Capillary refill takes less than 2 seconds.  Neurological:     General: No focal deficit present.     Mental Status: He is alert and oriented to person, place, and time.  Psychiatric:        Attention and Perception: Attention and perception normal.        Mood and Affect: Mood and affect normal.        Speech: Speech normal.        Behavior: Behavior normal. Behavior is cooperative.        Thought Content: Thought content normal.        Cognition and Memory: Cognition and memory normal.    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  Patient in agreement.  See COVID toes A/P      Relevant Orders   Ambulatory referral to Podiatry     Musculoskeletal and Integument   COVID toes - Primary    COVID toes, started post vaccination x 3 months ago with discoloration starting 1 week post vaccination.  Has had continued color change under right great toenail, anticipate will fall off.  No pain or current s/s of infection.  Discussed may begin to have blistering/ulcerations due to COVID toes etiology, sent in rx for hydrocortisone cream, should this begin.  Discussed scheduling with podiatry as new patient due to current dx of T2DM and COVID toes, should nail fall off, he would benefit from podiatry care.  Patient in agreement.  Plan: 1. Can use hydrocortisone cream, topically 2x per day, as needed if begin to have blistering/ulcerations 2. Referral to podiatry placed 3. RTC PRN      Relevant Medications   HYDROCORTISONE, TOPICAL, 2 % LOTN   Other Relevant Orders   Ambulatory referral to Podiatry      Meds ordered this encounter  Medications  . HYDROCORTISONE, TOPICAL, 2 % LOTN    Sig: Apply 1 application topically 2 (two) times daily as needed (for ulceration/blistering).    Dispense:  59.2 mL    Refill:  1      Follow up plan: Return if symptoms worsen or fail to improve.   Charlaine Dalton, FNP Family Nurse Practitioner [facility] 10/03/2019, 10:23 AM

## 2019-10-03 NOTE — Assessment & Plan Note (Signed)
See COVID toes A/P

## 2019-10-03 NOTE — Assessment & Plan Note (Signed)
COVID toes, started post vaccination x 3 months ago with discoloration starting 1 week post vaccination.  Has had continued color change under right great toenail, anticipate will fall off.  No pain or current s/s of infection.  Discussed may begin to have blistering/ulcerations due to COVID toes etiology, sent in rx for hydrocortisone cream, should this begin.  Discussed scheduling with podiatry as new patient due to current dx of T2DM and COVID toes, should nail fall off, he would benefit from podiatry care.  Patient in agreement.  Plan: 1. Can use hydrocortisone cream, topically 2x per day, as needed if begin to have blistering/ulcerations 2. Referral to podiatry placed 3. RTC PRN

## 2019-10-03 NOTE — Patient Instructions (Signed)
As we discussed, you have COVID toes, this is something that we are beginning to see more of.  It is generally not anything to worry about, but with the changing of your toenail colors, it is likely that the right great toenail will fall off.  I have sent in a prescription for hydrocortisone cream to apply topically 2x per day, as needed, if you begin to have any ulcerations/blistering from COVID toes.  A referral to podiatry has been placed today.  If you have not heard from the specialty office or our referral coordinator within 1 week, please let us know and we will follow up with the referral coordinator for an update.  We will plan to see you back if your symptoms worsen or fail to improve  You will receive a survey after today's visit either digitally by e-mail or paper by USPS mail. Your experiences and feedback matter to [place].  Please respond so we know how we are doing as we provide care for you.  Call us with any questions/concerns/needs.  It is my goal to be available to you for your health concerns.  Thanks for choosing me to be a partner in your healthcare needs!  Charlaine Dalton, FNP-C Family Nurse Practitioner [facility] Phone: [phone]

## 2019-11-19 ENCOUNTER — Ambulatory Visit: Payer: 59 | Admitting: Family Medicine

## 2019-11-19 ENCOUNTER — Other Ambulatory Visit: Payer: Self-pay

## 2019-11-19 ENCOUNTER — Other Ambulatory Visit: Payer: Self-pay | Admitting: Family Medicine

## 2019-11-19 ENCOUNTER — Encounter: Payer: Self-pay | Admitting: Family Medicine

## 2019-11-19 VITALS — BP 119/76 | HR 107 | Temp 98.1°F | Resp 18 | Ht 72.0 in | Wt 218.4 lb

## 2019-11-19 DIAGNOSIS — E1165 Type 2 diabetes mellitus with hyperglycemia: Secondary | ICD-10-CM | POA: Diagnosis not present

## 2019-11-19 DIAGNOSIS — U071 COVID-19: Secondary | ICD-10-CM | POA: Diagnosis not present

## 2019-11-19 DIAGNOSIS — Z23 Encounter for immunization: Secondary | ICD-10-CM | POA: Diagnosis not present

## 2019-11-19 DIAGNOSIS — R238 Other skin changes: Secondary | ICD-10-CM

## 2019-11-19 LAB — POCT GLYCOSYLATED HEMOGLOBIN (HGB A1C): Hemoglobin A1C: 8.7 % — AB (ref 4.0–5.6)

## 2019-11-19 MED ORDER — HYDROCORTISONE 2 % EX LOTN: 1.0000 "application " | TOPICAL_LOTION | Freq: Two times a day (BID) | CUTANEOUS | 1 refills | Status: DC | PRN

## 2019-11-19 NOTE — Progress Notes (Signed)
Subjective:    Patient ID: Bruce Brandt, male    DOB: Mar 12, 1970, 49 y.o.   MRN: [id number]  Bruce Brandt is a 49 y.o. male presenting on 11/19/2019 for Diabetes   HPI  Diabetes Pt presents today for follow up Type 2 Diabetes Mellitus.  He/she (caps): He ACTION; IS/IS NOT: is not checking AM CBG at home. -Current diabetic medications include: trulicity 0.75mg  weekly, jardiance 25mg  daily, metformin 850mg  BID WC -ACTION; IS/IS NOT: is not currently symptomatic -Actions; denies/reports/admits to: denies polydipsia, polyphagia, polyuria, headaches, diaphoresis, shakiness, chills, pain, numbness or tingling in extremities or changes in vision -Clinical course has been improving  -Reports no structured exercise routine -Diet is moderate in salt, moderate in fat, and moderate in carbohydrates  PREVENTION Eye exam current (within 1 year) Up to date Foot exam current (within 1 year) Up to date Lipid/ASCVD risk reduction - on statin: YES/NO: Yes  Kidney Protection (On ACE/ARB)? YES/NO: No   Depression screen [facility] 2/9 02/07/2019 01/03/2018  Decreased Interest 0 0  Down, Depressed, Hopeless 0 0  PHQ - 2 Score 0 0    Social History   Tobacco Use  . Smoking status: Never Smoker  . Smokeless tobacco: Never Used  Vaping Use  . Vaping Use: Never used  Substance Use Topics  . Alcohol use: Yes    Comment: > 2 years ago had rare alcohol intake  . Drug use: No    Review of Systems  Constitutional: Negative.   HENT: Negative.   Eyes: Negative.   Respiratory: Negative.   Cardiovascular: Negative.   Gastrointestinal: Negative.   Endocrine: Negative.   Genitourinary: Negative.   Musculoskeletal: Negative.   Skin: Negative.   Allergic/Immunologic: Negative.   Neurological: Negative.   Hematological: Negative.   Psychiatric/Behavioral: Negative.    Per HPI unless specifically indicated above     Objective:    BP 119/76 (BP Location: Right Arm, Patient Position: Sitting, Cuff  Size: Normal)   Pulse (!) 107   Temp 98.1 F (36.7 C) (Oral)   Resp 18   Ht 6' (1.829 m)   Wt 218 lb 6.4 oz (99.1 kg)   SpO2 100%   BMI 29.62 kg/m   Wt Readings from Last 3 Encounters:  11/19/19 218 lb 6.4 oz (99.1 kg)  10/03/19 216 lb 3.2 oz (98.1 kg)  08/14/19 215 lb (97.5 kg)    Physical Exam Vitals and nursing note reviewed.  Constitutional:      General: He is not in acute distress.    Appearance: Normal appearance. He is well-developed and well-groomed. He is not ill-appearing or toxic-appearing.  HENT:     Head: Normocephalic and atraumatic.     Nose:     Comments: 10/05/19 is in place, covering mouth and nose. Eyes:     General:        Right eye: No discharge.        Left eye: No discharge.     Extraocular Movements: Extraocular movements intact.     Conjunctiva/sclera: Conjunctivae normal.     Pupils: Pupils are equal, round, and reactive to light.  Cardiovascular:     Rate and Rhythm: Normal rate and regular rhythm.     Pulses: Normal pulses.     Heart sounds: Normal heart sounds. No murmur heard.  No friction rub. No gallop.   Pulmonary:     Effort: Pulmonary effort is normal. No respiratory distress.     Breath sounds: Normal breath sounds.  Musculoskeletal:  Right lower leg: No edema.     Left lower leg: No edema.  Skin:    General: Skin is warm and dry.     Capillary Refill: Capillary refill takes less than 2 seconds.  Neurological:     General: No focal deficit present.     Mental Status: He is alert and oriented to person, place, and time.  Psychiatric:        Attention and Perception: Attention and perception normal.        Mood and Affect: Mood and affect normal.        Speech: Speech normal.        Behavior: Behavior normal. Behavior is cooperative.        Thought Content: Thought content normal.        Cognition and Memory: Cognition and memory normal.        Judgment: Judgment normal.    Results for orders placed or performed in visit  on 11/19/19  POCT glycosylated hemoglobin (Hb A1C)  Result Value Ref Range   Hemoglobin A1C 8.7 (A) 4.0 - 5.6 %   HbA1c POC (<> result, manual entry)     HbA1c, POC (prediabetic range)     HbA1c, POC (controlled diabetic range)        Assessment & Plan:   Problem List Items Addressed This Visit      Endocrine   Type 2 diabetes mellitus with hyperglycemia, without long-term current use of insulin (HCC) - Primary    ImprovingDM with A1c 8.7% improved from 9.1% on 08/14/2019 and goal A1c < 7.0%.  Reports has stopped drinking zero sugar sodas, which were likely adding to an increase in his A1C. - Complications - DM retinopathy and hyperglycemia.  Plan:  1. Continue current therapy: Trulicity 0.75mg  weekly, jardiance 25mg  daily and metformin XR 850mg  BID WC 2. Encourage improved lifestyle: - low carb/low glycemic diet reinforced prior education - Increase physical activity to 30 minutes most days of the week.  Explained that increased physical activity increases body's use of sugar for energy. 3. Check fasting am CBG and log these.  Bring log to next visit for review 4. Continue ASA and Statin 5. Up to date on DM eye and foot exam 6. Follow-up 3 months       Relevant Orders   POCT glycosylated hemoglobin (Hb A1C) (Completed)     Musculoskeletal and Integument   COVID toes    Has continued concerns for right great toenail with color change, has increased since last visit in late August 2021.  Reports did not hear from the podiatry clinic regarding the podiatry referral.  I will follow up with podiatry referral and request they contact patient to schedule.  Plan: 1. Referral to Podiatry to follow up on      Relevant Medications   HYDROCORTISONE, TOPICAL, 2 % LOTN     Other   Need for immunization against influenza    Pt < age 64.  Needs annual influenza vaccine.  VIS provided.  Plan: 1. Administer Quad flu vaccine.       Relevant Orders   Flu Vaccine QUAD 6+ mos PF IM  (Fluarix Quad PF) (Completed)      Meds ordered this encounter  Medications  . HYDROCORTISONE, TOPICAL, 2 % LOTN    Sig: Apply 1 application topically 2 (two) times daily as needed (for ulceration/blistering).    Dispense:  59.2 mL    Refill:  1    Follow up plan: Return in  about 3 months (around 02/19/2020) for T2DM, A1C F/U.   Charlaine Dalton, FNP Family Nurse Practitioner [facility] 11/19/2019, 10:03 AM

## 2019-11-19 NOTE — Assessment & Plan Note (Signed)
ImprovingDM with A1c 8.7% improved from 9.1% on 08/14/2019 and goal A1c < 7.0%.  Reports has stopped drinking zero sugar sodas, which were likely adding to an increase in his A1C. - Complications - DM retinopathy and hyperglycemia.  Plan:  1. Continue current therapy: Trulicity 0.75mg  weekly, jardiance 25mg  daily and metformin XR 850mg  BID WC 2. Encourage improved lifestyle: - low carb/low glycemic diet reinforced prior education - Increase physical activity to 30 minutes most days of the week.  Explained that increased physical activity increases body's use of sugar for energy. 3. Check fasting am CBG and log these.  Bring log to next visit for review 4. Continue ASA and Statin 5. Up to date on DM eye and foot exam 6. Follow-up 3 months

## 2019-11-19 NOTE — Assessment & Plan Note (Signed)
Pt < age 49.  Needs annual influenza vaccine.  VIS provided.  Plan: 1. Administer Quad flu vaccine.  

## 2019-11-19 NOTE — Patient Instructions (Signed)
Your A1C has improved from 9.1% to 8.7%!  Keep up the good work.  Continue your medications as prescribed.  I will reach out to the referral team to find out why you haven't received any contact from the podiatry office and will be in touch.  You can learn more information online about your diabetes at American Diabetes Association: http://www.diabetes.org/ - General self-care (diet, medications, blood sugar checks). - Diet recommendations - There are even recipes available for you to look at and try.  We will plan to see you back in 3 months for diabetes follow up visit  You will receive a survey after today's visit either digitally by e-mail or paper by USPS mail. Your experiences and feedback matter to [place].  Please respond so we know how we are doing as we provide care for you.  Call us with any questions/concerns/needs.  It is my goal to be available to you for your health concerns.  Thanks for choosing me to be a partner in your healthcare needs!  Charlaine Dalton, FNP-C Family Nurse Practitioner [facility] Phone: [phone]

## 2019-11-19 NOTE — Telephone Encounter (Signed)
Requested medication (s) are due for refill today: yes  Requested medication (s) are on the active medication list:yes  Last refill: today  Future visit scheduled: yes Ordered today by Arbutus Ped  NP. Pharmacy requesting new strength. Other not available Please review. Off protocol    Requested Prescriptions  Pending Prescriptions Disp Refills   hydrocortisone 2.5 % lotion [Pharmacy Med Name: HYDROCORTISONE 2.5% LOTION] 59 mL 0    Sig: Please specify directions, refills and quantity      Off-Protocol Failed - 11/19/2019  8:28 AM      Failed - Medication not assigned to a protocol, review manually.      Passed - Valid encounter within last 12 months    Recent Outpatient Visits           Today Type 2 diabetes mellitus with hyperglycemia, without long-term current use of insulin [facility])   [facility], Jodelle Gross, FNP   1 month ago COVID toes   [facility], Jodelle Gross, FNP   3 months ago Type 2 diabetes mellitus with hyperglycemia, without long-term current use of insulin [facility])   [facility], Jodelle Gross, FNP   6 months ago Type 2 diabetes mellitus with hyperglycemia, without long-term current use of insulin [facility])   [facility], Jodelle Gross, FNP   9 months ago Type 2 diabetes mellitus with hyperglycemia, without long-term current use of insulin [facility])   [facility] Smitty Cords, DO       Future Appointments             In 3 months Malfi, Jodelle Gross, FNP [facility]

## 2019-11-19 NOTE — Assessment & Plan Note (Signed)
Has continued concerns for right great toenail with color change, has increased since last visit in late August 2021.  Reports did not hear from the podiatry clinic regarding the podiatry referral.  I will follow up with podiatry referral and request they contact patient to schedule.  Plan: 1. Referral to Podiatry to follow up on

## 2019-12-04 ENCOUNTER — Ambulatory Visit: Payer: 59 | Admitting: Podiatry

## 2019-12-04 ENCOUNTER — Other Ambulatory Visit: Payer: Self-pay

## 2019-12-04 DIAGNOSIS — S90219A Contusion of unspecified great toe with damage to nail, initial encounter: Secondary | ICD-10-CM

## 2019-12-04 NOTE — Progress Notes (Signed)
   HPI: 49 y.o. male PMHx diabetes mellitus presenting today as a new patient for evaluation of bilateral great toenail plate discoloration.  Patient states that he was previously diagnosed with Covid toe.  Patient noticed discoloration in his toenails approximately 1 week after his Covid shot.  He blames the Covid for having discoloration of the toenails.  He states that he did not have any skin condition or erythema of the toes associated to the supposed the Covid toe.  Past Medical History:  Diagnosis Date  . Carpal tunnel syndrome, bilateral 05/18/2016  . Diabetes (HCC)   . Frequent headaches   . Gingivitis         Physical Exam: General: The patient is alert and oriented x3 in no acute distress.  Dermatology: Skin is warm, dry and supple bilateral lower extremities. Negative for open lesions or macerations.  Subungual hematomas noted to the right hallux nail plate.  These appear dry.  No longitudinal streaking noted.  There is a transverse ridge/line demonstrating new growth however proximal to this ridge/line are two discolored areas at the nail base which I suspect are additional trauma related subungual hematomas..  Again, there is no streaking noted.  Suspect subungual hematomas.  Please see attached photo.   The left hallux nail plate also has some distal discoloratio that appears to be growing out as well n.  Vascular: Palpable pedal pulses bilaterally. No edema or erythema noted. Capillary refill within normal limits.  Neurological: Epicritic and protective threshold grossly intact bilaterally.   Musculoskeletal Exam: Range of motion within normal limits to all pedal and ankle joints bilateral. Muscle strength 5/5 in all groups bilateral.    Assessment: 1.  Trauma bilateral hallux nail plate 2.  Subungual hematomas   Plan of Care:  1. Patient evaluated.   2.  Nail plates appear benign in nature.  Explained to the patient that over the next 6-9 months the base of the nail  should start to clear out in the dark lesions at the base of the nail should begin to grow out with new nail growth. 3.  Return to clinic in 6 months for follow-up and to compare today's pictures with the appearance of the nail plate 6 months from now  *Patient is a carpenter      Felecia Shelling, DPM [facility]  Dr. Felecia Shelling, DPM    [address] [place], [place] [address]                Office [phone]  Fax [phone]

## 2020-01-19 ENCOUNTER — Other Ambulatory Visit: Payer: Self-pay | Admitting: Family Medicine

## 2020-01-19 DIAGNOSIS — E1165 Type 2 diabetes mellitus with hyperglycemia: Secondary | ICD-10-CM

## 2020-01-19 NOTE — Telephone Encounter (Signed)
Requested Prescriptions  Pending Prescriptions Disp Refills   TRULICITY 0.75 MG/0.5ML SOPN [Pharmacy Med Name: TRULICITY 0.75 MG/0.5 ML PEN] 6 mL 0    Sig: INJECT 0.75 MG INTO THE SKIN EVERY 7 DAYS.     Endocrinology:  Diabetes - GLP-1 Receptor Agonists Failed - 01/19/2020  9:26 AM      Failed - HBA1C is between 0 and 7.9 and within 180 days    Hemoglobin A1C  Date Value Ref Range Status  11/19/2019 8.7 (A) 4.0 - 5.6 % Final   Hgb A1c MFr Bld  Date Value Ref Range Status  02/07/2019 8.2 (H) <5.7 % of total Hgb Final    Comment:    For someone without known diabetes, a hemoglobin A1c value of 6.5% or greater indicates that they may have  diabetes and this should be confirmed with a follow-up  test. . For someone with known diabetes, a value <7% indicates  that their diabetes is well controlled and a value  greater than or equal to 7% indicates suboptimal  control. A1c targets should be individualized based on  duration of diabetes, age, comorbid conditions, and  other considerations. . Currently, no consensus exists regarding use of hemoglobin A1c for diagnosis of diabetes for children. Verna Czech - Valid encounter within last 6 months    Recent Outpatient Visits          2 months ago Type 2 diabetes mellitus with hyperglycemia, without long-term current use of insulin (HCC)   [facility], Jodelle Gross, FNP   3 months ago COVID toes   [facility], Jodelle Gross, FNP   5 months ago Type 2 diabetes mellitus with hyperglycemia, without long-term current use of insulin [facility])   [facility], Jodelle Gross, FNP   8 months ago Type 2 diabetes mellitus with hyperglycemia, without long-term current use of insulin [facility])   [facility], Jodelle Gross, FNP   11 months ago Type 2 diabetes mellitus with hyperglycemia, without long-term current use of insulin [facility])   [facility] C[facility], DO      Future Appointments            In 1 month [place], Jodelle Gross, FNP [facility], [facility]

## 2020-02-15 ENCOUNTER — Other Ambulatory Visit: Payer: Self-pay | Admitting: Family Medicine

## 2020-02-15 DIAGNOSIS — E1165 Type 2 diabetes mellitus with hyperglycemia: Secondary | ICD-10-CM

## 2020-02-22 ENCOUNTER — Other Ambulatory Visit: Payer: Self-pay

## 2020-02-22 ENCOUNTER — Encounter: Payer: Self-pay | Admitting: Family Medicine

## 2020-02-22 ENCOUNTER — Ambulatory Visit: Payer: 59 | Admitting: Family Medicine

## 2020-02-22 VITALS — BP 125/78 | HR 96 | Temp 97.5°F | Resp 18 | Ht 72.0 in | Wt 217.0 lb

## 2020-02-22 DIAGNOSIS — L089 Local infection of the skin and subcutaneous tissue, unspecified: Secondary | ICD-10-CM

## 2020-02-22 DIAGNOSIS — E1165 Type 2 diabetes mellitus with hyperglycemia: Secondary | ICD-10-CM | POA: Diagnosis not present

## 2020-02-22 LAB — POCT GLYCOSYLATED HEMOGLOBIN (HGB A1C): Hemoglobin A1C: 8.4 % — AB (ref 4.0–5.6)

## 2020-02-22 MED ORDER — SULFAMETHOXAZOLE-TRIMETHOPRIM 800-160 MG PO TABS: 1.0000 | ORAL_TABLET | Freq: Two times a day (BID) | ORAL | 0 refills | Status: AC

## 2020-02-22 NOTE — Progress Notes (Signed)
Subjective:    Patient ID: Bruce Brandt, male    DOB: 03/30/1970, 50 y.o.   MRN: [id number]  Bruce Brandt is a 50 y.o. male presenting on 02/22/2020 for Diabetes   HPI  Mr. Bruce Brandt presents to clinic for a follow up on his diabetes and left skin fingertip infection.  Reports he had been working with a miter saw and cut his finger.  Has been washing with peroxide with delayed healing.   Diabetes Pt presents today for follow up Type 2 Diabetes Mellitus.  He/she (caps): He ACTION; IS/IS NOT: is checking AM CBG at home that are elevated > 170. -Current diabetic medications include: trulicity 0.75mg  subcutaneously weekly, jardiance 25mg  daily and metformin XR 850mg  BID WC -ACTION; IS/IS NOT: is not currently symptomatic -Actions; denies/reports/admits to: denies polydipsia, polyphagia, polyuria, headaches, diaphoresis, shakiness, chills, pain, numbness or tingling in extremities or changes in vision -Clinical course has been stable -Reports no exercise routine -Diet is high in salt, high in fat, and high in carbohydrates  PREVENTION Eye exam current (within 1 year) Up to date Foot exam current (within 1 year) Up to date Lipid/ASCVD risk reduction - on statin: YES/NO: Yes  Kidney Protection (On ACE/ARB)? YES/NO: No   Depression screen [facility] 2/9 02/22/2020 02/07/2019 01/03/2018  Decreased Interest 0 0 0  Down, Depressed, Hopeless 0 0 0  PHQ - 2 Score 0 0 0    Social History   Tobacco Use  . Smoking status: Never Smoker  . Smokeless tobacco: Never Used  Vaping Use  . Vaping Use: Never used  Substance Use Topics  . Alcohol use: Yes    Comment: > 2 years ago had rare alcohol intake  . Drug use: No    Review of Systems  Constitutional: Negative.   HENT: Negative.   Eyes: Negative.   Respiratory: Negative.   Cardiovascular: Negative.   Gastrointestinal: Negative.   Endocrine: Negative.   Genitourinary: Negative.   Musculoskeletal: Negative.   Skin: Negative.        Left  index finger laceration  Allergic/Immunologic: Negative.   Neurological: Negative.   Hematological: Negative.   Psychiatric/Behavioral: Negative.    Per HPI unless specifically indicated above     Objective:    BP 125/78 (BP Location: Right Arm, Patient Position: Sitting, Cuff Size: Normal)   Pulse 96   Temp (!) 97.5 F (36.4 C) (Temporal)   Resp 18   Ht 6' (1.829 m)   Wt 217 lb (98.4 kg)   SpO2 99%   BMI 29.43 kg/m   Wt Readings from Last 3 Encounters:  02/22/20 217 lb (98.4 kg)  11/19/19 218 lb 6.4 oz (99.1 kg)  10/03/19 216 lb 3.2 oz (98.1 kg)    Physical Exam Vitals and nursing note reviewed.  Constitutional:      General: He is not in acute distress.    Appearance: Normal appearance. He is well-developed and well-groomed. He is not ill-appearing or toxic-appearing.  HENT:     Head: Normocephalic and atraumatic.     Nose:     Comments: 01/19/20 is in place, covering mouth and nose. Eyes:     General:        Right eye: No discharge.        Left eye: No discharge.     Extraocular Movements: Extraocular movements intact.     Conjunctiva/sclera: Conjunctivae normal.     Pupils: Pupils are equal, round, and reactive to light.  Cardiovascular:  Rate and Rhythm: Normal rate and regular rhythm.     Pulses: Normal pulses.     Heart sounds: Normal heart sounds. No murmur heard. No friction rub. No gallop.   Pulmonary:     Effort: Pulmonary effort is normal. No respiratory distress.     Breath sounds: Normal breath sounds.  Musculoskeletal:     Right lower leg: No edema.     Left lower leg: No edema.  Skin:    General: Skin is warm and dry.     Capillary Refill: Capillary refill takes less than 2 seconds.     Comments: Left index finger with laceration, irregular borders, redness with slight edema  Neurological:     General: No focal deficit present.     Mental Status: He is alert and oriented to person, place, and time.  Psychiatric:        Attention and  Perception: Attention and perception normal.        Mood and Affect: Mood and affect normal.        Speech: Speech normal.        Behavior: Behavior normal. Behavior is cooperative.        Thought Content: Thought content normal.        Cognition and Memory: Cognition and memory normal.    Results for orders placed or performed in visit on 02/22/20  POCT glycosylated hemoglobin (Hb A1C)  Result Value Ref Range   Hemoglobin A1C 8.4 (A) 4.0 - 5.6 %   HbA1c POC (<> result, manual entry)     HbA1c, POC (prediabetic range)     HbA1c, POC (controlled diabetic range)        Assessment & Plan:   Problem List Items Addressed This Visit      Endocrine   Type 2 diabetes mellitus with hyperglycemia, without long-term current use of insulin (HCC) - Primary    ControlledDM with A1c 8.4% improved from 8.7% on 11/19/2019 and goal A1c < 7.0%. - Complications - DM retinopathy and hyperglycemia.  Plan:  1. Continue current therapy: Trulicity 0.75mg  weekly, jardiance 25mg  daily, and metformin XR 850mg  BID WC 2. Encourage improved lifestyle: - low carb/low glycemic diet reinforced prior education - Increase physical activity to 30 minutes most days of the week.  Explained that increased physical activity increases body's use of sugar for energy. 3. Check fasting am CBG and log these.  Bring log to next visit for review 4. Continue ASA and Statin 5. Up to date on DM eye and foot exam 6. Follow-up 6 months 7. Referral to Endocrinology placed as patient is interested in reducing DM medications with better glycemic control.      Relevant Orders   POCT glycosylated hemoglobin (Hb A1C) (Completed)   Ambulatory referral to Endocrinology     Musculoskeletal and Integument   Skin infection    Left index finger with laceration, irregular borders, no active bleeding, has been cleaned with peroxide at home.  Up to date on TDAP vaccination.  Encouraged to wash with soap and water.  Will treat with bactrim  DS 1 tablet BID x 5 days.  RTC if symptoms worsen or fail to improve.      Relevant Medications   sulfamethoxazole-trimethoprim (BACTRIM DS) 800-160 MG tablet      Meds ordered this encounter  Medications  . sulfamethoxazole-trimethoprim (BACTRIM DS) 800-160 MG tablet    Sig: Take 1 tablet by mouth 2 (two) times daily for 5 days.    Dispense:  10  tablet    Refill:  0    Follow up plan: Return in about 6 months (around 08/21/2020) for T2DM A1C F/U.   Charlaine Dalton, FNP Family Nurse Practitioner [facility] 02/22/2020, 8:44 AM

## 2020-02-22 NOTE — Patient Instructions (Signed)
I have sent in a prescription for bactrim DS to take 1 tablet 2x per day for the next 5 days for your left index finger tip infection.  Continue all medications as directed  You can learn more information online about your diabetes at American Diabetes Association: http://www.diabetes.org/ - General self-care (diet, medications, blood sugar checks). - Diet recommendations - There are even recipes available for you to look at and try.  A referral to Endocrinology has been placed today.  If you have not heard from the specialty office or our referral coordinator within 1 week, please let us know and we will follow up with the referral coordinator for an update.  We will plan to see you back in 6 months for diabetes follow up visit  You will receive a survey after today's visit either digitally by e-mail or paper by USPS mail. Your experiences and feedback matter to [place].  Please respond so we know how we are doing as we provide care for you.  Call us with any questions/concerns/needs.  It is my goal to be available to you for your health concerns.  Thanks for choosing me to be a partner in your healthcare needs!  Charlaine Dalton, FNP-C Family Nurse Practitioner [facility] Phone: [phone]

## 2020-02-22 NOTE — Assessment & Plan Note (Signed)
Left index finger with laceration, irregular borders, no active bleeding, has been cleaned with peroxide at home.  Up to date on TDAP vaccination.  Encouraged to wash with soap and water.  Will treat with bactrim DS 1 tablet BID x 5 days.  RTC if symptoms worsen or fail to improve.

## 2020-02-22 NOTE — Assessment & Plan Note (Signed)
ControlledDM with A1c 8.4% improved from 8.7% on 11/19/2019 and goal A1c < 7.0%. - Complications - DM retinopathy and hyperglycemia.  Plan:  1. Continue current therapy: Trulicity 0.75mg  weekly, jardiance 25mg  daily, and metformin XR 850mg  BID WC 2. Encourage improved lifestyle: - low carb/low glycemic diet reinforced prior education - Increase physical activity to 30 minutes most days of the week.  Explained that increased physical activity increases body's use of sugar for energy. 3. Check fasting am CBG and log these.  Bring log to next visit for review 4. Continue ASA and Statin 5. Up to date on DM eye and foot exam 6. Follow-up 6 months 7. Referral to Endocrinology placed as patient is interested in reducing DM medications with better glycemic control.

## 2020-06-03 ENCOUNTER — Other Ambulatory Visit: Payer: Self-pay

## 2020-06-03 ENCOUNTER — Encounter: Payer: Self-pay | Admitting: Internal Medicine

## 2020-06-03 ENCOUNTER — Ambulatory Visit: Payer: 59 | Admitting: Internal Medicine

## 2020-06-03 VITALS — BP 121/79 | HR 94 | Temp 97.1°F | Resp 18 | Ht 72.0 in | Wt 214.4 lb

## 2020-06-03 DIAGNOSIS — E1165 Type 2 diabetes mellitus with hyperglycemia: Secondary | ICD-10-CM

## 2020-06-03 DIAGNOSIS — E1142 Type 2 diabetes mellitus with diabetic polyneuropathy: Secondary | ICD-10-CM | POA: Diagnosis not present

## 2020-06-03 DIAGNOSIS — R519 Headache, unspecified: Secondary | ICD-10-CM

## 2020-06-03 DIAGNOSIS — E7800 Pure hypercholesterolemia, unspecified: Secondary | ICD-10-CM

## 2020-06-03 DIAGNOSIS — E782 Mixed hyperlipidemia: Secondary | ICD-10-CM

## 2020-06-03 DIAGNOSIS — E1169 Type 2 diabetes mellitus with other specified complication: Secondary | ICD-10-CM | POA: Insufficient documentation

## 2020-06-03 DIAGNOSIS — E785 Hyperlipidemia, unspecified: Secondary | ICD-10-CM | POA: Insufficient documentation

## 2020-06-03 DIAGNOSIS — G4700 Insomnia, unspecified: Secondary | ICD-10-CM | POA: Insufficient documentation

## 2020-06-03 DIAGNOSIS — N5201 Erectile dysfunction due to arterial insufficiency: Secondary | ICD-10-CM

## 2020-06-03 DIAGNOSIS — G8929 Other chronic pain: Secondary | ICD-10-CM

## 2020-06-03 DIAGNOSIS — M25511 Pain in right shoulder: Secondary | ICD-10-CM | POA: Diagnosis not present

## 2020-06-03 DIAGNOSIS — G4709 Other insomnia: Secondary | ICD-10-CM

## 2020-06-03 MED ORDER — TRULICITY 0.75 MG/0.5ML ~~LOC~~ SOAJ
0.7500 mg | SUBCUTANEOUS | 0 refills | Status: DC
Start: 2020-06-03 — End: 2020-08-23

## 2020-06-03 MED ORDER — EMPAGLIFLOZIN 25 MG PO TABS: 25.0000 mg | ORAL_TABLET | Freq: Every day | ORAL | 0 refills | Status: DC

## 2020-06-03 MED ORDER — FREESTYLE LIBRE 14 DAY SENSOR MISC: 1 refills | Status: DC

## 2020-06-03 MED ORDER — METFORMIN HCL 850 MG PO TABS: 850.0000 mg | ORAL_TABLET | Freq: Two times a day (BID) | ORAL | 0 refills | Status: DC

## 2020-06-03 NOTE — Assessment & Plan Note (Signed)
Not medicated Will monitor 

## 2020-06-03 NOTE — Progress Notes (Signed)
Subjective:    Patient ID: Bruce Brandt, male    DOB: December 08, 1970, 50 y.o.   MRN: [id number]  HPI  Patient presents the clinic today for follow-up of chronic conditions.  He is establishing care with me today, transferring care from Danielle Rankin, NP.  DM.: His last A1c was 8.4%, 02/2020.  He reports he ran out of Metformin, Jardiance, and Trulicity 2 weeks ago. He has been out of his Josephine Igo as well so he has not been checking his sugars. He checks his feet routinely.  [facility]  Flu 12/2019.  Pneumovax never.  COVID-Pfizer x2. He did not get the booster secondary to development of covid toes from the second vaccine.  HLD: His last LDL was 160, 05/2019.  He denies myalgias on Atorvastatin (taking once weekly instead of daily because it makes him feel like "crap".  He tries to consume a low-fat diet.  Chronic Right Shoulder Pain: His pain depends on how he sleeps. He takes Naproxen as needed with some relief of symptoms.  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 He is not currently taking any medications for this.  Frequent Headaches: He reports a daily headache x 3 days. The pain is located in his forehead. He describes the pain as throbbing. Managed with Zonegran and Imipramine.  He is following with neurology.  He also has concerns about insomnia. He reports difficulty falling back asleep after getting up to urinate. He reports he typically only gets up x 1 around 1 am. He reports it can take him 20 minutes to an hour to fall back asleep. He takes Advil PM with minimal relief of symptoms.  Review of Systems      Past Medical History:  Diagnosis Date  . Carpal tunnel syndrome, bilateral 05/18/2016  . Diabetes (HCC)   . Frequent headaches   . Gingivitis     Current Outpatient Medications  Medication Sig Dispense Refill  . aspirin EC 81 MG tablet Take 81 mg by mouth daily.    Marland Kitchen atorvastatin (LIPITOR) 10 MG tablet TAKE 1  TABLET BY MOUTH EVERY DAY (Patient taking differently: Take 10 mg by mouth once a week.) 90 tablet 0  . baclofen (LIORESAL) 10 MG tablet     . Cholecalciferol (VITAMIN D-3) 125 MCG (5000 UT) TABS Take 2 capsules by mouth daily.    . Continuous Blood Gluc Sensor (FREESTYLE LIBRE 14 DAY SENSOR) MISC USE AS DIRECTED TO CHECK BLOOD GLUCOSE. CHANGE EVERY 14 DAYS 7 each 1  . gabapentin (NEURONTIN) 600 MG tablet Take 1,200 mg by mouth 2 (two) times daily. (Patient not taking: Reported on 02/22/2020)    . glucose blood (FREESTYLE TEST STRIPS) test strip Use as instructed to check blood sugar up to two times daily. 50 each 12  . hydrocortisone 2.5 % lotion Apply topically 2 (two) times daily. 59 mL 0  . imipramine (TOFRANIL) 25 MG tablet Take 100 mg by mouth daily.     Marland Kitchen JARDIANCE 25 MG TABS tablet TAKE 1 TABLET BY MOUTH EVERY DAY 90 tablet 0  . Magnesium 100 MG TABS Take by mouth.    . metFORMIN (GLUCOPHAGE) 850 MG tablet Take 1 tablet (850 mg total) by mouth 2 (two) times daily with a meal. 180 tablet 2  . Naproxen Sod-diphenhydrAMINE 220-25 MG TABS Take 2 tablets by mouth at bedtime as needed.    . Nutritional Supplements (JUICE PLUS FIBRE) LIQD Take by mouth.    Marland Kitchen  TRULICITY 0.75 MG/0.5ML SOPN INJECT 0.75 MG INTO THE SKIN EVERY 7 DAYS. 6 mL 0  . zonisamide (ZONEGRAN) 25 MG capsule Take 100 mg by mouth daily.     No current facility-administered medications for this visit.    Allergies  Allergen Reactions  . Covid-19 (Mrna) Vaccine Proofreader) [Covid-19 (Mrna) Vaccine]     Bruising under great toe on both feet 1 week s/p Moderna Immunization    Family History  Problem Relation Age of Onset  . Heart disease Father   . Diabetes Maternal Grandmother   . Diabetes Paternal Grandmother     Social History   Socioeconomic History  . Marital status: Married    Spouse name: Not on file  . Number of children: 2  . Years of education: 15  . Highest education level: High school graduate  Occupational  History  . Occupation: Music therapist    Comment: Self-employed  Tobacco Use  . Smoking status: Never Smoker  . Smokeless tobacco: Never Used  Vaping Use  . Vaping Use: Never used  Substance and Sexual Activity  . Alcohol use: Yes    Comment: > 2 years ago had rare alcohol intake  . Drug use: No  . Sexual activity: Yes    Partners: Female    Comment: Married  Other Topics Concern  . Not on file  Social History Narrative   Lives at home w/ his wife   Right-handed   Caffeine: three 16 oz drinks daily   Social Determinants of Health   Financial Resource Strain: Not on file  Food Insecurity: Not on file  Transportation Needs: Not on file  Physical Activity: Not on file  Stress: Not on file  Social Connections: Not on file  Intimate Partner Violence: Not on file     Constitutional: Patient reports fdaily headaches.  Denies fever, malaise, fatigue, or abrupt weight changes.  HEENT: Denies eye pain, eye redness, ear pain, ringing in the ears, wax buildup, runny nose, nasal congestion, bloody nose, or sore throat. Respiratory: Denies difficulty breathing, shortness of breath, cough or sputum production.   Cardiovascular: Denies chest pain, chest tightness, palpitations or swelling in the hands or feet.  Gastrointestinal: Denies abdominal pain, bloating, constipation, diarrhea or blood in the stool.  GU: Patient has a history of erectile dysfunction.  Denies urgency, frequency, pain with urination, burning sensation, blood in urine, odor or discharge. Musculoskeletal: Patient reports chronic right shoulder pain.  Denies decrease in range of motion, difficulty with gait, muscle pain or joint swelling.  Skin: Denies redness, rashes, lesions or ulcercations.  Neurological: Denies dizziness, difficulty with memory, difficulty with speech or problems with balance and coordination.  Psych: Patient reports insomnia.  Denies anxiety, depression, SI/HI.  No other specific complaints in a  complete review of systems (except as listed in HPI above).  Objective:   Physical Exam   BP 121/79 (BP Location: Right Arm, Patient Position: Sitting, Cuff Size: Normal)   Pulse 94   Temp (!) 97.1 F (36.2 C) (Temporal)   Resp 18   Ht 6' (1.829 m)   Wt 214 lb 6.4 oz (97.3 kg)   SpO2 99%   BMI 29.08 kg/m   Wt Readings from Last 3 Encounters:  02/22/20 217 lb (98.4 kg)  11/19/19 218 lb 6.4 oz (99.1 kg)  10/03/19 216 lb 3.2 oz (98.1 kg)    General: Appears his stated age, well developed, well nourished in NAD. Skin: Warm, dry and intact. Discoloration of right great toenail noted.  No ulcerations noted. HEENT: Head: normal shape and size; Eyes: sclera white, no icterus, conjunctiva pink, PERRLA and EOMs intact;  Neck:  Neck supple, trachea midline. No masses, lumps or thyromegaly present.  Cardiovascular: Normal rate and rhythm. S1,S2 noted.  No murmur, rubs or gallops noted. No JVD or BLE edema. No carotid bruits noted. Pulmonary/Chest: Normal effort and positive vesicular breath sounds. No respiratory distress. No wheezes, rales or ronchi noted.  Musculoskeletal:  No difficulty with gait.  Neurological: Alert and oriented. Sensation intact to BLE. Psychiatric: Mood and affect flat. He avoids eye contact. Rude.   BMET    Component Value Date/Time   NA 140 05/10/2019 0839   K 4.5 05/10/2019 0839   CL 107 05/10/2019 0839   CO2 25 05/10/2019 0839   GLUCOSE 192 (H) 05/10/2019 0839   BUN 11 05/10/2019 0839   CREATININE 0.60 05/10/2019 0839   CALCIUM 9.4 05/10/2019 0839   GFRNONAA 119 05/10/2019 0839   GFRAA 138 05/10/2019 0839    Lipid Panel     Component Value Date/Time   CHOL 236 (H) 05/10/2019 0839   TRIG 97 05/10/2019 0839   HDL 55 05/10/2019 0839   CHOLHDL 4.3 05/10/2019 0839   LDLCALC 160 (H) 05/10/2019 0839    CBC    Component Value Date/Time   WBC 5.3 05/10/2019 0839   RBC 5.60 05/10/2019 0839   HGB 16.2 05/10/2019 0839   HCT 49.7 05/10/2019 0839    PLT 227 05/10/2019 0839   MCV 88.8 05/10/2019 0839   MCH 28.9 05/10/2019 0839   MCHC 32.6 05/10/2019 0839   RDW 12.5 05/10/2019 0839   LYMPHSABS 1,489 05/10/2019 0839   MONOABS 0.4 11/17/2017 0957   EOSABS 58 05/10/2019 0839   BASOSABS 32 05/10/2019 0839    Hgb A1C Lab Results  Component Value Date   HGBA1C 8.4 (A) 02/22/2020          Assessment & Plan:    Nicki Reaper, NP This visit occurred during the SARS-CoV-2 public health emergency.  Safety protocols were in place, including screening questions prior to the visit, additional usage of staff PPE, and extensive cleaning of exam room while observing appropriate contact time as indicated for disinfecting solutions.

## 2020-06-03 NOTE — Assessment & Plan Note (Signed)
Encouraged daily stretching Continue Naproxen as needed

## 2020-06-03 NOTE — Assessment & Plan Note (Signed)
C-Met and lipid profile today Encouraged him to consume a low-fat diet Continue Atorvastatin once weekly for now

## 2020-06-03 NOTE — Assessment & Plan Note (Signed)
Uncontrolled C-Met, A1c and urine microalbumin today Encouraged him to consume a low-carb diet and exercise weight loss Metformin, Jardiance and Trulicity refilled, may need adjustment based on A1c Libre refilled Foot exam today Encourage routine eye exams Flu shot UTD He declines Pneumovax and COVID booster

## 2020-06-03 NOTE — Patient Instructions (Signed)

## 2020-06-03 NOTE — Assessment & Plan Note (Signed)
Continue Advil PM for now CMET today

## 2020-06-03 NOTE — Assessment & Plan Note (Signed)
Managed with Imipramine and Zonegran He will continue to follow with neurology Will follow

## 2020-06-04 LAB — COMPREHENSIVE METABOLIC PANEL
AG Ratio: 2.2 (calc) (ref 1.0–2.5)
ALT: 25 U/L (ref 9–46)
AST: 19 U/L (ref 10–40)
Albumin: 4.8 g/dL (ref 3.6–5.1)
Alkaline phosphatase (APISO): 79 U/L (ref 36–130)
BUN: 12 mg/dL (ref 7–25)
CO2: 24 mmol/L (ref 20–32)
Calcium: 9 mg/dL (ref 8.6–10.3)
Chloride: 106 mmol/L (ref 98–110)
Creat: 0.61 mg/dL (ref 0.60–1.35)
Globulin: 2.2 g/dL (calc) (ref 1.9–3.7)
Glucose, Bld: 357 mg/dL — ABNORMAL HIGH (ref 65–99)
Potassium: 4.3 mmol/L (ref 3.5–5.3)
Sodium: 138 mmol/L (ref 135–146)
Total Bilirubin: 0.5 mg/dL (ref 0.2–1.2)
Total Protein: 7 g/dL (ref 6.1–8.1)

## 2020-06-04 LAB — HEMOGLOBIN A1C
Hgb A1c MFr Bld: 9.7 % of total Hgb — ABNORMAL HIGH (ref <5.7)
Mean Plasma Glucose: 232 mg/dL
eAG (mmol/L): 12.8 mmol/L

## 2020-06-04 LAB — LIPID PANEL
Cholesterol: 255 mg/dL — ABNORMAL HIGH (ref <200)
HDL: 70 mg/dL (ref >=40)
LDL Cholesterol (Calc): 163 mg/dL (calc) — ABNORMAL HIGH
Non-HDL Cholesterol (Calc): 185 mg/dL (calc) — ABNORMAL HIGH (ref <130)
Total CHOL/HDL Ratio: 3.6 (calc) (ref <5.0)
Triglycerides: 102 mg/dL (ref <150)

## 2020-06-04 LAB — MICROALBUMIN / CREATININE URINE RATIO
Creatinine, Urine: 39 mg/dL (ref 20–320)
Microalb Creat Ratio: 10 mcg/mg creat (ref <30)
Microalb, Ur: 0.4 mg/dL

## 2020-08-23 ENCOUNTER — Other Ambulatory Visit: Payer: Self-pay | Admitting: Internal Medicine

## 2020-08-23 DIAGNOSIS — E1165 Type 2 diabetes mellitus with hyperglycemia: Secondary | ICD-10-CM

## 2020-08-23 NOTE — Telephone Encounter (Signed)
Requested Prescriptions  Pending Prescriptions Disp Refills  . TRULICITY 0.75 MG/0.5ML SOPN [Pharmacy Med Name: TRULICITY 0.75 MG/0.5 ML PEN] 6 mL 0    Sig: INJECT 0.75 MG INTO THE SKIN ONCE A WEEK.     Endocrinology:  Diabetes - GLP-1 Receptor Agonists Failed - 08/23/2020 10:30 AM      Failed - HBA1C is between 0 and 7.9 and within 180 days    Hgb A1c MFr Bld  Date Value Ref Range Status  06/03/2020 9.7 (H) <5.7 % of total Hgb Final    Comment:    For someone without known diabetes, a hemoglobin A1c value of 6.5% or greater indicates that they may have  diabetes and this should be confirmed with a follow-up  test. . For someone with known diabetes, a value <7% indicates  that their diabetes is well controlled and a value  greater than or equal to 7% indicates suboptimal  control. A1c targets should be individualized based on  duration of diabetes, age, comorbid conditions, and  other considerations. . Currently, no consensus exists regarding use of hemoglobin A1c for diagnosis of diabetes for children. .          Failed - Valid encounter within last 6 months    Recent Outpatient Visits          2 months ago Diabetic peripheral neuropathy associated with type 2 diabetes mellitus [facility])   [facility], Salvadore Oxford, NP   6 months ago Type 2 diabetes mellitus with hyperglycemia, without long-term current use of insulin [facility])   [facility], Jodelle Gross, FNP   9 months ago Type 2 diabetes mellitus with hyperglycemia, without long-term current use of insulin [facility])   [facility], Jodelle Gross, FNP   10 months ago COVID toes   [facility], Jodelle Gross, FNP   1 year ago Type 2 diabetes mellitus with hyperglycemia, without long-term current use of insulin [facility])   [facility], Jodelle Gross, FNP      Future Appointments            In 2 weeks Sampson Si, Salvadore Oxford, NP [facility]

## 2020-08-25 ENCOUNTER — Telehealth: Payer: Self-pay

## 2020-08-25 NOTE — Telephone Encounter (Signed)
patient's wife tested positive today for covid. He has diabetes, so he wants to take precautions. He asking about taking medication to keep that from happening.   I called and left a message advising that his wife stayed quarantined for the duration of her sick time.   I advised that he try to keep surfaces and areas that they share clean with bleach.   I mentioned that if he begins having symptoms to take a home test an to call us back if it is positive.

## 2020-08-28 ENCOUNTER — Other Ambulatory Visit: Payer: Self-pay | Admitting: Internal Medicine

## 2020-08-28 DIAGNOSIS — E1165 Type 2 diabetes mellitus with hyperglycemia: Secondary | ICD-10-CM

## 2020-08-28 NOTE — Telephone Encounter (Signed)
   Notes to clinic:  Patient has appt on 09/09/2020 Review for refill Due for 6 month follow up Requested Prescriptions  Pending Prescriptions Disp Refills   metFORMIN (GLUCOPHAGE) 850 MG tablet [Pharmacy Med Name: METFORMIN HCL 850 MG TABLET] 180 tablet 0    Sig: Take 1 tablet (850 mg total) by mouth 2 (two) times daily with a meal.      Endocrinology:  Diabetes - Biguanides Failed - 08/28/2020  9:10 AM      Failed - HBA1C is between 0 and 7.9 and within 180 days    Hgb A1c MFr Bld  Date Value Ref Range Status  06/03/2020 9.7 (H) <5.7 % of total Hgb Final    Comment:    For someone without known diabetes, a hemoglobin A1c value of 6.5% or greater indicates that they may have  diabetes and this should be confirmed with a follow-up  test. . For someone with known diabetes, a value <7% indicates  that their diabetes is well controlled and a value  greater than or equal to 7% indicates suboptimal  control. A1c targets should be individualized based on  duration of diabetes, age, comorbid conditions, and  other considerations. . Currently, no consensus exists regarding use of hemoglobin A1c for diagnosis of diabetes for children. .           Failed - eGFR in normal range and within 360 days    GFR, Est African American  Date Value Ref Range Status  05/10/2019 138 > OR = 60 mL/min/1.83m2 Final   GFR, Est Non African American  Date Value Ref Range Status  05/10/2019 119 > OR = 60 mL/min/1.26m2 Final          Failed - Valid encounter within last 6 months    Recent Outpatient Visits           2 months ago Diabetic peripheral neuropathy associated with type 2 diabetes mellitus [facility])   [facility], Coralie Keens, NP   6 months ago Type 2 diabetes mellitus with hyperglycemia, without long-term current use of insulin [facility])   [facility], [name], FNP   9 months ago Type 2 diabetes mellitus with hyperglycemia, without long-term current use  of insulin [facility])   [facility], [name], FNP   11 months ago [facility], [name], FNP   1 year ago Type 2 diabetes mellitus with hyperglycemia, without long-term current use of insulin [facility])   [facility], [name], FNP       Future Appointments             In 1 week Garnette Gunner, Coralie Keens, NP [place] in normal range and within 360 days    Creat  Date Value Ref Range Status  06/03/2020 0.61 0.60 - 1.35 mg/dL Final   Creatinine, Urine  Date Value Ref Range Status  06/03/2020 39 20 - 320 mg/dL Final

## 2020-08-29 ENCOUNTER — Other Ambulatory Visit: Payer: Self-pay | Admitting: Internal Medicine

## 2020-08-29 DIAGNOSIS — E1165 Type 2 diabetes mellitus with hyperglycemia: Secondary | ICD-10-CM

## 2020-08-29 NOTE — Telephone Encounter (Signed)
Notes to clinic:  Patient has appt on 09/09/2020 Review for refill  Due for follow up and labs   Requested Prescriptions  Pending Prescriptions Disp Refills   JARDIANCE 25 MG TABS tablet [Pharmacy Med Name: JARDIANCE 25 MG TABLET] 90 tablet 0    Sig: TAKE 1 TABLET (25 MG TOTAL) BY MOUTH DAILY.      Endocrinology:  Diabetes - SGLT2 Inhibitors Failed - 08/29/2020  8:50 AM      Failed - LDL in normal range and within 360 days    LDL Cholesterol (Calc)  Date Value Ref Range Status  06/03/2020 163 (H) mg/dL (calc) Final    Comment:    Reference range: <100 . Desirable range <100 mg/dL for primary prevention;   <70 mg/dL for patients with CHD or diabetic patients  with > or = 2 CHD risk factors. Marland Kitchen LDL-C is now calculated using the Martin-Hopkins  calculation, which is a validated novel method providing  better accuracy than the Friedewald equation in the  estimation of LDL-C.  Cresenciano Genre et al. Annamaria Helling. 4097;353(29): 2061-2068  (http://education.[employer].com/faq/FAQ164)           Failed - HBA1C is between 0 and 7.9 and within 180 days    Hgb A1c MFr Bld  Date Value Ref Range Status  06/03/2020 9.7 (H) <5.7 % of total Hgb Final    Comment:    For someone without known diabetes, a hemoglobin A1c value of 6.5% or greater indicates that they may have  diabetes and this should be confirmed with a follow-up  test. . For someone with known diabetes, a value <7% indicates  that their diabetes is well controlled and a value  greater than or equal to 7% indicates suboptimal  control. A1c targets should be individualized based on  duration of diabetes, age, comorbid conditions, and  other considerations. . Currently, no consensus exists regarding use of hemoglobin A1c for diagnosis of diabetes for children. .           Failed - eGFR in normal range and within 360 days    GFR, Est African American  Date Value Ref Range Status  05/10/2019 138 > OR = 60 mL/min/1.76m Final    GFR, Est Non African American  Date Value Ref Range Status  05/10/2019 119 > OR = 60 mL/min/1.753mFinal          Failed - Valid encounter within last 6 months    Recent Outpatient Visits           2 months ago [facility] peripheral [facility] with [facility] 2 [facility]   6 months ago Type 2 diabetes mellitus with hyperglycemia, without long-term current use of insulin ([facility]  [facility]   9 months ago Type 2 diabetes mellitus with hyperglycemia, without long-term current use of insulin [facility]  [facility]   11 [facility]   1 year ago Type 2 diabetes mellitus with hyperglycemia, without long-term current use of insulin ([facility]  [facility]       Future Appointments             In 1 week Baity, Re[facility]oral[facility] KeensNP [facility]  Passed - Cr in normal range and within 360 days    Creat  Date Value Ref Range Status  06/03/2020 0.61 0.60 - 1.35 mg/dL Final   Creatinine, Urine  Date Value Ref Range Status  06/03/2020 39 20 - 320 mg/dL Final

## 2020-09-02 ENCOUNTER — Encounter: Payer: 59 | Admitting: Internal Medicine

## 2020-09-09 ENCOUNTER — Ambulatory Visit (INDEPENDENT_AMBULATORY_CARE_PROVIDER_SITE_OTHER): Payer: 59 | Admitting: Internal Medicine

## 2020-09-09 ENCOUNTER — Encounter: Payer: Self-pay | Admitting: Internal Medicine

## 2020-09-09 ENCOUNTER — Other Ambulatory Visit: Payer: Self-pay

## 2020-09-09 VITALS — BP 125/73 | HR 94 | Temp 97.1°F | Resp 17 | Ht 72.0 in | Wt 213.4 lb

## 2020-09-09 DIAGNOSIS — Z125 Encounter for screening for malignant neoplasm of prostate: Secondary | ICD-10-CM

## 2020-09-09 DIAGNOSIS — E1165 Type 2 diabetes mellitus with hyperglycemia: Secondary | ICD-10-CM

## 2020-09-09 DIAGNOSIS — Z1159 Encounter for screening for other viral diseases: Secondary | ICD-10-CM

## 2020-09-09 DIAGNOSIS — Z114 Encounter for screening for human immunodeficiency virus [HIV]: Secondary | ICD-10-CM | POA: Diagnosis not present

## 2020-09-09 DIAGNOSIS — Z6828 Body mass index (BMI) 28.0-28.9, adult: Secondary | ICD-10-CM | POA: Insufficient documentation

## 2020-09-09 DIAGNOSIS — Z6829 Body mass index (BMI) 29.0-29.9, adult: Secondary | ICD-10-CM | POA: Insufficient documentation

## 2020-09-09 DIAGNOSIS — Z0001 Encounter for general adult medical examination with abnormal findings: Secondary | ICD-10-CM | POA: Diagnosis not present

## 2020-09-09 DIAGNOSIS — E66811 Obesity, class 1: Secondary | ICD-10-CM | POA: Insufficient documentation

## 2020-09-09 DIAGNOSIS — E7800 Pure hypercholesterolemia, unspecified: Secondary | ICD-10-CM

## 2020-09-09 DIAGNOSIS — E663 Overweight: Secondary | ICD-10-CM | POA: Insufficient documentation

## 2020-09-09 DIAGNOSIS — Z1211 Encounter for screening for malignant neoplasm of colon: Secondary | ICD-10-CM

## 2020-09-09 MED ORDER — ONETOUCH ULTRASOFT LANCETS MISC: 12 refills | Status: DC

## 2020-09-09 MED ORDER — ONETOUCH ULTRA VI STRP: ORAL_STRIP | 12 refills | Status: AC

## 2020-09-09 MED ORDER — ONETOUCH ULTRA 2 W/DEVICE KIT: 1.0000 | PACK | Freq: Two times a day (BID) | 0 refills | Status: DC

## 2020-09-09 NOTE — Assessment & Plan Note (Signed)
Encourage diet and exercise weight loss 

## 2020-09-09 NOTE — Assessment & Plan Note (Signed)
C-Met, lipid and A1c today Urine microalbumin checked at last visit Encouraged him to consume a low-carb diet and exercise for weight loss Continue Metformin, Jardiance and Trulicity for now, will consider change to medication regimen once labs are back Encouraged him to schedule an appointment for his eye exam Encourage routine foot exams Encouraged him to get a flu shot the fall He declines Pneumovax today Encouraged him to get his COVID booster

## 2020-09-09 NOTE — Assessment & Plan Note (Signed)
C-Met and lipid profile today Encouraged him to consume a low-fat diet  continue Atorvastatin 1 time weekly for now

## 2020-09-09 NOTE — Addendum Note (Signed)
Addended by: Lonna Cobb on: 09/09/2020 04:03 PM   Modules accepted: Orders

## 2020-09-09 NOTE — Progress Notes (Signed)
Subjective:    Patient ID: Bruce Brandt, male    DOB: Apr 29, 1970, 50 y.o.   MRN: [id number]  HPI  Pt presents to the clinic today for his annual exam. He is also due to follow up on DM 2 and HLD.  DM 2: His last A1C was 9.7%, 05/2020. He is taking Metformin, Jardiance and Trulicity as prescribed. His is unable to check his sugars because he can not wear the [place] during the summer because it will not stick to his sweaty skin. He would like a RX for a meter, strips and lancets.  He does not feel like his current medication regimen is effective.  He checks his feet routinely. His last eye exam was about 1 year ago.  HLD: His last LDL was 163, triglycerides 102, 05/2020. He denies myalgias on Atorvastatin (1 x week, does not take daily because it makes him feel terrible, has tried multiple statins in the past). He tries to consume a low fat diet.  Flu: 11/2019 Tetanus: 03/2018 Pneumovax: never Covid: Pfizer x 2 PSA screening: 01/2019 Colon screening: never Vision screening: as needed Dentist: bianually  Diet: He does eat meat. He consumes fruits and veggies. He does eat some fried foods. He drinks mostly water, Powerade Zero. Exercise: None  Review of Systems     Past Medical History:  Diagnosis Date   Carpal tunnel syndrome, bilateral 05/18/2016   Diabetes (HCC)    Frequent headaches    Gingivitis     Current Outpatient Medications  Medication Sig Dispense Refill   aspirin EC 81 MG tablet Take 81 mg by mouth daily.     atorvastatin (LIPITOR) 10 MG tablet TAKE 1 TABLET BY MOUTH EVERY DAY (Patient taking differently: Take 10 mg by mouth once a week.) 90 tablet 0   Cholecalciferol (VITAMIN D-3) 125 MCG (5000 UT) TABS Take 2 capsules by mouth daily.     Continuous Blood Gluc Sensor (FREESTYLE LIBRE 14 DAY SENSOR) MISC USE AS DIRECTED TO CHECK BLOOD GLUCOSE. CHANGE EVERY 14 DAYS 6 each 1   empagliflozin (JARDIANCE) 25 MG TABS tablet TAKE 1 TABLET (25 MG TOTAL) BY MOUTH DAILY. 90  tablet 3   glucose blood (FREESTYLE TEST STRIPS) test strip Use as instructed to check blood sugar up to two times daily. 50 each 12   hydrocortisone 2.5 % lotion Apply topically 2 (two) times daily. 59 mL 0   imipramine (TOFRANIL) 25 MG tablet Take 100 mg by mouth daily.      Magnesium 100 MG TABS Take by mouth.     metFORMIN (GLUCOPHAGE) 850 MG tablet TAKE 1 TABLET (850 MG TOTAL) BY MOUTH 2 (TWO) TIMES DAILY WITH A MEAL. 180 tablet 0   Naproxen Sod-diphenhydrAMINE 220-25 MG TABS Take 2 tablets by mouth at bedtime as needed.     Nutritional Supplements (JUICE PLUS FIBRE) LIQD Take by mouth.     TRULICITY 0.75 MG/0.5ML SOPN INJECT 0.75 MG INTO THE SKIN ONCE A WEEK. 6 mL 0   zonisamide (ZONEGRAN) 25 MG capsule Take 100 mg by mouth daily.     zonisamide (ZONEGRAN) 50 MG capsule Take 200 mg by mouth daily.     No current facility-administered medications for this visit.    Allergies  Allergen Reactions   Covid-19 (Mrna) Vaccine Proofreader) [Covid-19 (Mrna) Vaccine]     Bruising under great toe on both feet 1 week s/p Moderna Immunization    Family History  Problem Relation Age of Onset   Heart disease  Father    Diabetes Maternal Grandmother    Diabetes Paternal Grandmother     Social History   Socioeconomic History   Marital status: Married    Spouse name: Not on file   Number of children: 2   Years of education: 12   Highest education level: High school graduate  Occupational History   Occupation: Music therapist    Comment: Self-employed  Tobacco Use   Smoking status: Never   Smokeless tobacco: Never  Vaping Use   Vaping Use: Never used  Substance and Sexual Activity   Alcohol use: Yes    Comment: > 2 years ago had rare alcohol intake   Drug use: No   Sexual activity: Yes    Partners: Female    Comment: Married  Other Topics Concern   Not on file  Social History Narrative   Lives at home w/ his wife   Right-handed   Caffeine: three 16 oz drinks daily   Social  Determinants of Health   Financial Resource Strain: Not on file  Food Insecurity: Not on file  Transportation Needs: Not on file  Physical Activity: Not on file  Stress: Not on file  Social Connections: Not on file  Intimate Partner Violence: Not on file     Constitutional: Pt reports headaches. Denies fever, malaise, fatigue, or abrupt weight changes.  HEENT: Denies eye pain, eye redness, ear pain, ringing in the ears, wax buildup, runny nose, nasal congestion, bloody nose, or sore throat. Respiratory: Denies difficulty breathing, shortness of breath, cough or sputum production.   Cardiovascular: Denies chest pain, chest tightness, palpitations or swelling in the hands or feet.  Gastrointestinal: Denies abdominal pain, bloating, constipation, diarrhea or blood in the stool.  GU: Pt reports erectile dysfunction. Denies urgency, frequency, pain with urination, burning sensation, blood in urine, odor or discharge. Musculoskeletal: Patient reports chronic right shoulder pain.  Denies decrease in range of motion, difficulty with gait, muscle pain or joint swelling.  Skin: Denies redness, rashes, lesions or ulcercations.  Neurological: Patient reports insomnia.  Denies dizziness, difficulty with memory, difficulty with speech or problems with balance and coordination.  Psych: Denies anxiety, depression, SI/HI.  No other specific complaints in a complete review of systems (except as listed in HPI above).  Objective:   Physical Exam  BP 125/73 (BP Location: Right Arm, Patient Position: Sitting, Cuff Size: Large)   Pulse 94   Temp (!) 97.1 F (36.2 C) (Temporal)   Resp 17   Ht 6' (1.829 m)   Wt 213 lb 6.4 oz (96.8 kg)   SpO2 100%   BMI 28.94 kg/m   Wt Readings from Last 3 Encounters:  06/03/20 214 lb 6.4 oz (97.3 kg)  02/22/20 217 lb (98.4 kg)  11/19/19 218 lb 6.4 oz (99.1 kg)    General: Appears his stated age, overweight in NAD. Skin: Warm, dry and intact. Abrasions noted to  BLE but no ulcerations noted. HEENT: Head: normal shape and size; Eyes: sclera white and EOMs intact;  Neck:  Neck supple, trachea midline. No masses, lumps or thyromegaly present.  Cardiovascular: Normal rate and rhythm. S1,S2 noted.  No murmur, rubs or gallops noted. No JVD or BLE edema. No carotid bruits noted. Pulmonary/Chest: Normal effort and positive vesicular breath sounds. No respiratory distress. No wheezes, rales or ronchi noted.  Abdomen: Soft and nontender. Normal bowel sounds. No distention or masses noted. Liver, spleen and kidneys non palpable. Musculoskeletal: Strength 5/5 BUE/BLE. No difficulty with gait.  Neurological: Alert and  oriented. Cranial nerves II-XII grossly intact. Coordination normal.  Psychiatric: Mood and affect normal. Behavior is normal. Judgment and thought content normal.     BMET    Component Value Date/Time   NA 138 06/03/2020 0825   K 4.3 06/03/2020 0825   CL 106 06/03/2020 0825   CO2 24 06/03/2020 0825   GLUCOSE 357 (H) 06/03/2020 0825   BUN 12 06/03/2020 0825   CREATININE 0.61 06/03/2020 0825   CALCIUM 9.0 06/03/2020 0825   GFRNONAA 119 05/10/2019 0839   GFRAA 138 05/10/2019 0839    Lipid Panel     Component Value Date/Time   CHOL 255 (H) 06/03/2020 0825   TRIG 102 06/03/2020 0825   HDL 70 06/03/2020 0825   CHOLHDL 3.6 06/03/2020 0825   LDLCALC 163 (H) 06/03/2020 0825    CBC    Component Value Date/Time   WBC 5.3 05/10/2019 0839   RBC 5.60 05/10/2019 0839   HGB 16.2 05/10/2019 0839   HCT 49.7 05/10/2019 0839   PLT 227 05/10/2019 0839   MCV 88.8 05/10/2019 0839   MCH 28.9 05/10/2019 0839   MCHC 32.6 05/10/2019 0839   RDW 12.5 05/10/2019 0839   LYMPHSABS 1,489 05/10/2019 0839   MONOABS 0.4 11/17/2017 0957   EOSABS 58 05/10/2019 0839   BASOSABS 32 05/10/2019 0839    Hgb A1C Lab Results  Component Value Date   HGBA1C 9.7 (H) 06/03/2020           Assessment & Plan:   Preventative Health Maintenance:  Encouraged  him to get a flu shot in the fall Tetanus UTD He declines pneumovax Encouraged him to get his covid booster Referral to GI for screening colonoscopy Encouraged him to consume a balanced diet and exercise regimen Advised him to see an eye doctor and dentist annually Will check CBC, CMET, Lipid, A1C, PSA, HIV and Hep C today  RTC in 3 months, follow up chronic conditions Nicki Reaper, NP This visit occurred during the SARS-CoV-2 public health emergency.  Safety protocols were in place, including screening questions prior to the visit, additional usage of staff PPE, and extensive cleaning of exam room while observing appropriate contact time as indicated for disinfecting solutions.

## 2020-09-09 NOTE — Patient Instructions (Signed)
Health Maintenance, Male Adopting a healthy lifestyle and getting preventive care are important in promoting health and wellness. Ask your health care provider about: The right schedule for you to have regular tests and exams. Things you can do on your own to prevent diseases and keep yourself healthy. What should I know about diet, weight, and exercise? Eat a healthy diet  Eat a diet that includes plenty of vegetables, fruits, low-fat dairy products, and lean protein. Do not eat a lot of foods that are high in solid fats, added sugars, or sodium.  Maintain a healthy weight Body mass index (BMI) is a measurement that can be used to identify possible weight problems. It estimates body fat based on height and weight. Your health care provider can help determine your BMI and help you achieve or maintain ahealthy weight. Get regular exercise Get regular exercise. This is one of the most important things you can do for your health. Most adults should: Exercise for at least 150 minutes each week. The exercise should increase your heart rate and make you sweat (moderate-intensity exercise). Do strengthening exercises at least twice a week. This is in addition to the moderate-intensity exercise. Spend less time sitting. Even light physical activity can be beneficial. Watch cholesterol and blood lipids Have your blood tested for lipids and cholesterol at 50 years of age, then havethis test every 5 years. You may need to have your cholesterol levels checked more often if: Your lipid or cholesterol levels are high. You are older than 50 years of age. You are at high risk for heart disease. What should I know about cancer screening? Many types of cancers can be detected early and may often be prevented. Depending on your health history and family history, you may need to have cancer screening at various ages. This may include screening for: Colorectal cancer. Prostate cancer. Skin cancer. Lung  cancer. What should I know about heart disease, diabetes, and high blood pressure? Blood pressure and heart disease High blood pressure causes heart disease and increases the risk of stroke. This is more likely to develop in people who have high blood pressure readings, are of African descent, or are overweight. Talk with your health care provider about your target blood pressure readings. Have your blood pressure checked: Every 3-5 years if you are 18-39 years of age. Every year if you are 40 years old or older. If you are between the ages of 65 and 75 and are a current or former smoker, ask your health care provider if you should have a one-time screening for abdominal aortic aneurysm (AAA). Diabetes Have regular diabetes screenings. This checks your fasting blood sugar level. Have the screening done: Once every three years after age 45 if you are at a normal weight and have a low risk for diabetes. More often and at a younger age if you are overweight or have a high risk for diabetes. What should I know about preventing infection? Hepatitis B If you have a higher risk for hepatitis B, you should be screened for this virus. Talk with your health care provider to find out if you are at risk forhepatitis B infection. Hepatitis C Blood testing is recommended for: Everyone born from 1945 through 1965. Anyone with known risk factors for hepatitis C. Sexually transmitted infections (STIs) You should be screened each year for STIs, including gonorrhea and chlamydia, if: You are sexually active and are younger than 50 years of age. You are older than 50 years of age   and your health care provider tells you that you are at risk for this type of infection. Your sexual activity has changed since you were last screened, and you are at increased risk for chlamydia or gonorrhea. Ask your health care provider if you are at risk. Ask your health care provider about whether you are at high risk for HIV.  Your health care provider may recommend a prescription medicine to help prevent HIV infection. If you choose to take medicine to prevent HIV, you should first get tested for HIV. You should then be tested every 3 months for as long as you are taking the medicine. Follow these instructions at home: Lifestyle Do not use any products that contain nicotine or tobacco, such as cigarettes, e-cigarettes, and chewing tobacco. If you need help quitting, ask your health care provider. Do not use street drugs. Do not share needles. Ask your health care provider for help if you need support or information about quitting drugs. Alcohol use Do not drink alcohol if your health care provider tells you not to drink. If you drink alcohol: Limit how much you have to 0-2 drinks a day. Be aware of how much alcohol is in your drink. In the [place] one drink equals one 12 oz bottle of beer (355 mL), one 5 oz glass of wine (148 mL), or one 1 oz glass of hard liquor (44 mL). General instructions Schedule regular health, dental, and eye exams. Stay current with your vaccines. Tell your health care provider if: You often feel depressed. You have ever been abused or do not feel safe at home. Summary Adopting a healthy lifestyle and getting preventive care are important in promoting health and wellness. Follow your health care provider's instructions about healthy diet, exercising, and getting tested or screened for diseases. Follow your health care provider's instructions on monitoring your cholesterol and blood pressure. This information is not intended to replace advice given to you by your health care provider. Make sure you discuss any questions you have with your healthcare provider. Document Revised: 01/18/2018 Document Reviewed: 01/18/2018 Elsevier Patient Education  2022 Elsevier Inc.  

## 2020-09-10 LAB — COMPLETE METABOLIC PANEL WITH GFR
AG Ratio: 2.3 (calc) (ref 1.0–2.5)
ALT: 26 U/L (ref 9–46)
AST: 16 U/L (ref 10–35)
Albumin: 4.9 g/dL (ref 3.6–5.1)
Alkaline phosphatase (APISO): 71 U/L (ref 35–144)
BUN: 18 mg/dL (ref 7–25)
CO2: 25 mmol/L (ref 20–32)
Calcium: 10 mg/dL (ref 8.6–10.3)
Chloride: 105 mmol/L (ref 98–110)
Creat: 0.7 mg/dL (ref 0.70–1.30)
Globulin: 2.1 g/dL (calc) (ref 1.9–3.7)
Glucose, Bld: 222 mg/dL — ABNORMAL HIGH (ref 65–99)
Potassium: 5.2 mmol/L (ref 3.5–5.3)
Sodium: 138 mmol/L (ref 135–146)
Total Bilirubin: 0.4 mg/dL (ref 0.2–1.2)
Total Protein: 7 g/dL (ref 6.1–8.1)
eGFR: 112 mL/min/{1.73_m2} (ref >=60)

## 2020-09-10 LAB — CBC
HCT: 50.8 % — ABNORMAL HIGH (ref 38.5–50.0)
Hemoglobin: 16.5 g/dL (ref 13.2–17.1)
MCH: 29 pg (ref 27.0–33.0)
MCHC: 32.5 g/dL (ref 32.0–36.0)
MCV: 89.4 fL (ref 80.0–100.0)
MPV: 11 fL (ref 7.5–12.5)
Platelets: 232 10*3/uL (ref 140–400)
RBC: 5.68 10*6/uL (ref 4.20–5.80)
RDW: 12.4 % (ref 11.0–15.0)
WBC: 7.2 10*3/uL (ref 3.8–10.8)

## 2020-09-10 LAB — HIV ANTIBODY (ROUTINE TESTING W REFLEX): HIV 1&2 Ab, 4th Generation: NONREACTIVE

## 2020-09-10 LAB — LIPID PANEL
Cholesterol: 224 mg/dL — ABNORMAL HIGH (ref <200)
HDL: 68 mg/dL (ref >=40)
LDL Cholesterol (Calc): 128 mg/dL (calc) — ABNORMAL HIGH
Non-HDL Cholesterol (Calc): 156 mg/dL (calc) — ABNORMAL HIGH (ref <130)
Total CHOL/HDL Ratio: 3.3 (calc) (ref <5.0)
Triglycerides: 165 mg/dL — ABNORMAL HIGH (ref <150)

## 2020-09-10 LAB — HEPATITIS C ANTIBODY
Hepatitis C Ab: NONREACTIVE
SIGNAL TO CUT-OFF: 0.01 (ref <1.00)

## 2020-09-10 LAB — HEMOGLOBIN A1C
Hgb A1c MFr Bld: 9.1 % of total Hgb — ABNORMAL HIGH (ref <5.7)
Mean Plasma Glucose: 214 mg/dL
eAG (mmol/L): 11.9 mmol/L

## 2020-09-10 LAB — PSA: PSA: 0.6 ng/mL (ref <=4.00)

## 2020-09-15 ENCOUNTER — Other Ambulatory Visit: Payer: Self-pay

## 2020-09-15 MED ORDER — CLENPIQ 10-3.5-12 MG-GM -GM/160ML PO SOLN: 320.0000 mL | Freq: Once | ORAL | 0 refills | Status: AC

## 2020-10-09 ENCOUNTER — Telehealth: Payer: Self-pay

## 2020-10-09 NOTE — Telephone Encounter (Addendum)
Copied from CRM [id number]. Topic: Referral - Question >> Oct 09, 2020  2:46 PM Aretta Nip wrote: Pls FU with pt at [phone]. Pt had referral from [facility] re colonoscopy and when he went for his pre admit this am he was told that Bruce Brandt coded his Colonoscopy screening order incorrectly and that it was going to effect his billing. Pt is wanting a call-back before procedure as needs to know before-hand what or how the insuance will pay. Pls fu asap at [phone] procedure nxt week

## 2020-10-09 NOTE — Telephone Encounter (Signed)
I reached out to [facility]. They informed me that they didn't see any diagnoses codes that would have effect his billing.  I was given Ginger (Dr. Vilma Prader nurse that handles billing) phone # [phone].

## 2020-10-09 NOTE — Telephone Encounter (Signed)
I attempted to contact the patient twice no answer, voicemail full.

## 2020-10-14 NOTE — Telephone Encounter (Signed)
I attempted to contact the patient again, no answer. Voicemail full.

## 2020-10-17 ENCOUNTER — Ambulatory Visit: Payer: 59 | Admitting: Anesthesiology

## 2020-10-17 ENCOUNTER — Encounter
Admission: RE | Disposition: A | Discharge status: Home / Self Care | Payer: Self-pay | Source: Home / Self Care | Attending: Gastroenterology

## 2020-10-17 ENCOUNTER — Encounter: Payer: Self-pay | Admitting: Gastroenterology

## 2020-10-17 ENCOUNTER — Ambulatory Visit
Admission: RE | Admit: 2020-10-17 | Discharge: 2020-10-17 | Disposition: A | Discharge status: Home / Self Care | Payer: 59 | Attending: Gastroenterology | Admitting: Gastroenterology

## 2020-10-17 DIAGNOSIS — Z7984 Long term (current) use of oral hypoglycemic drugs: Secondary | ICD-10-CM | POA: Insufficient documentation

## 2020-10-17 DIAGNOSIS — Z79899 Other long term (current) drug therapy: Secondary | ICD-10-CM | POA: Insufficient documentation

## 2020-10-17 DIAGNOSIS — Z9852 Vasectomy status: Secondary | ICD-10-CM | POA: Diagnosis not present

## 2020-10-17 DIAGNOSIS — E119 Type 2 diabetes mellitus without complications: Secondary | ICD-10-CM | POA: Diagnosis not present

## 2020-10-17 DIAGNOSIS — Z833 Family history of diabetes mellitus: Secondary | ICD-10-CM | POA: Diagnosis not present

## 2020-10-17 DIAGNOSIS — Z7982 Long term (current) use of aspirin: Secondary | ICD-10-CM | POA: Insufficient documentation

## 2020-10-17 DIAGNOSIS — Z1211 Encounter for screening for malignant neoplasm of colon: Secondary | ICD-10-CM | POA: Insufficient documentation

## 2020-10-17 DIAGNOSIS — Z887 Allergy status to serum and vaccine status: Secondary | ICD-10-CM | POA: Insufficient documentation

## 2020-10-17 HISTORY — PX: COLONOSCOPY: SHX5424

## 2020-10-17 LAB — GLUCOSE, CAPILLARY: Glucose-Capillary: 152 mg/dL — ABNORMAL HIGH (ref 70–99)

## 2020-10-17 SURGERY — COLONOSCOPY
Anesthesia: General

## 2020-10-17 MED ORDER — PROPOFOL 10 MG/ML IV BOLUS
INTRAVENOUS | Status: DC | PRN
Start: 2020-10-17 — End: 2020-10-17
  Administered 2020-10-17: 100 mg via INTRAVENOUS

## 2020-10-17 MED ORDER — PROPOFOL 500 MG/50ML IV EMUL
INTRAVENOUS | Status: AC
  Filled 2020-10-17: qty 50

## 2020-10-17 MED ORDER — PROPOFOL 500 MG/50ML IV EMUL
INTRAVENOUS | Status: DC | PRN
  Administered 2020-10-17: 130 ug/kg/min via INTRAVENOUS

## 2020-10-17 MED ORDER — SODIUM CHLORIDE 0.9 % IV SOLN: INTRAVENOUS | Status: DC

## 2020-10-17 NOTE — Op Note (Signed)
[facility] Gastroenterology Patient Name: Bruce Brandt Procedure Date: 10/17/2020 9:48 AM MRN: [id number] Account #: [id number] Date of Birth: 02-06-1971 Admit Type: Outpatient Age: 50 Room: [facility] ENDO ROOM 4 Gender: Male Note Status: Finalized Instrument Name: Prentice Docker [id number] Procedure:             Colonoscopy Indications:           Surveillance: Personal history of colonic polyps                         (unknown histology) on last colonoscopy 5 years ago Providers:             Wyline Mood MD, MD Medicines:             Monitored Anesthesia Care Complications:         No immediate complications. Procedure:             Pre-Anesthesia Assessment:                        - Prior to the procedure, a History and Physical was                         performed, and patient medications, allergies and                         sensitivities were reviewed. The patient's tolerance                         of previous anesthesia was reviewed.                        - The risks and benefits of the procedure and the                         sedation options and risks were discussed with the                         patient. All questions were answered and informed                         consent was obtained.                        - ASA Grade Assessment: II - A patient with mild                         systemic disease.                        After obtaining informed consent, the colonoscope was                         passed under direct vision. Throughout the procedure,                         the patient's blood pressure, pulse, and oxygen                         saturations were monitored continuously. The  Colonoscope was introduced through the anus and                         advanced to the the cecum, identified by the                         appendiceal orifice. The colonoscopy was performed                         with ease. The patient tolerated  the procedure well.                         The quality of the bowel preparation was adequate. Findings:      The perianal and digital rectal examinations were normal.      The entire examined colon appeared normal on direct and retroflexion       views. Impression:            - The entire examined colon is normal on direct and                         retroflexion views.                        - No specimens collected. Recommendation:        - Discharge patient to home (with escort).                        - Resume previous diet.                        - Continue present medications.                        - Repeat colonoscopy in 10 years for screening                         purposes. Procedure Code(s):     --- Professional ---                        [phone], Colonoscopy, flexible; diagnostic, including                         collection of specimen(s) by brushing or washing, when                         performed (separate procedure) Diagnosis Code(s):     --- Professional ---                        Z86.010, Personal history of colonic polyps CPT copyright 2019 American Medical Association. All rights reserved. The codes documented in this report are preliminary and upon coder review may  be revised to meet current compliance requirements. Wyline Mood, MD Wyline Mood MD, MD 10/17/2020 10:43:56 AM This report has been signed electronically. Number of Addenda: 0 Note Initiated On: 10/17/2020 9:48 AM Scope Withdrawal Time: 0 hours 9 minutes 31 seconds  Total Procedure Duration: 0 hours 15 minutes 5 seconds  Estimated Blood Loss:  Estimated blood loss: none.      [facility]

## 2020-10-17 NOTE — Anesthesia Postprocedure Evaluation (Signed)
Anesthesia Post Note  Patient: Bruce Brandt  Procedure(s) Performed: COLONOSCOPY  Patient location during evaluation: Phase II Anesthesia Type: General Level of consciousness: awake and alert, awake and oriented Pain management: pain level controlled Vital Signs Assessment: post-procedure vital signs reviewed and stable Respiratory status: spontaneous breathing, nonlabored ventilation and respiratory function stable Cardiovascular status: blood pressure returned to baseline and stable Postop Assessment: no apparent nausea or vomiting Anesthetic complications: no   No notable events documented.   Last Vitals:  Vitals:   10/17/20 1100 10/17/20 1106  BP: 116/86 132/87  Pulse: 100 100  Resp: (!) 21 14  Temp:    SpO2: 94% 94%    Last Pain:  Vitals:   10/17/20 0938  TempSrc: Temporal  PainSc: 0-No pain                 Manfred Arch

## 2020-10-17 NOTE — Anesthesia Preprocedure Evaluation (Signed)
Anesthesia Evaluation  Patient identified by MRN, date of birth, ID band Patient awake    Reviewed: Allergy & Precautions, NPO status , Patient's Chart, lab work & pertinent test results  Airway Mallampati: II  TM Distance: >3 FB Neck ROM: Full    Dental  (+) Poor Dentition   Pulmonary neg pulmonary ROS,    Pulmonary exam normal        Cardiovascular negative cardio ROS Normal cardiovascular exam     Neuro/Psych  Headaches,  Neuromuscular disease negative psych ROS   GI/Hepatic negative GI ROS, Neg liver ROS, Bowel prep,  Endo/Other  negative endocrine ROSdiabetes, Well Controlled, Type 1  Renal/GU negative Renal ROS  negative genitourinary   Musculoskeletal negative musculoskeletal ROS (+)   Abdominal   Peds negative pediatric ROS (+)  Hematology negative hematology ROS (+)   Anesthesia Other Findings Carpal tunnel syndrome, bilateral 05/18/2016 Diabetes (HCC)    Frequent headaches    Gingivitis       Reproductive/Obstetrics negative OB ROS                             Anesthesia Physical Anesthesia Plan  ASA: 3  Anesthesia Plan: General   Post-op Pain Management:    Induction: Intravenous  PONV Risk Score and Plan: 2 and Propofol infusion  Airway Management Planned: Natural Airway and Nasal Cannula  Additional Equipment:   Intra-op Plan:   Post-operative Plan:   Informed Consent: I have reviewed the patients History and Physical, chart, labs and discussed the procedure including the risks, benefits and alternatives for the proposed anesthesia with the patient or authorized representative who has indicated his/her understanding and acceptance.       Plan Discussed with: CRNA, Anesthesiologist and Surgeon  Anesthesia Plan Comments:         Anesthesia Quick Evaluation

## 2020-10-17 NOTE — H&P (Addendum)
Bruce Bellows, MD [address] [place], [place], [place], [address] [address], [place], [place], [place], [address] Phone: [phone]  Fax: [phone]  Primary Care Physician:  [name], NP   Pre-Procedure History & Physical: HPI:  Bruce Brandt is a 50 y.o. male is here for an colonoscopy.   Past Medical History:  Diagnosis Date   Carpal tunnel syndrome, bilateral 05/18/2016   Diabetes ([place])    Frequent headaches    Gingivitis     Past Surgical History:  Procedure Laterality Date   COLONOSCOPY WITH PROPOFOL     MOUTH SURGERY     gingival repair   VASECTOMY      Prior to Admission medications   Medication Sig Start Date End Date Taking? Authorizing Provider  empagliflozin (JARDIANCE) 25 MG TABS tablet TAKE 1 TABLET (25 MG TOTAL) BY MOUTH DAILY. 08/29/20  Yes [name], NP  imipramine (TOFRANIL) 25 MG tablet Take 100 mg by mouth daily.  12/27/18  Yes [provider]  metFORMIN (GLUCOPHAGE) 850 MG tablet TAKE 1 TABLET (850 MG TOTAL) BY MOUTH 2 (TWO) TIMES DAILY WITH A MEAL. 08/28/20  Yes Baity, Coralie Keens, NP  zonisamide (ZONEGRAN) 25 MG capsule Take 100 mg by mouth daily. 01/16/20  Yes [provider]  zonisamide (ZONEGRAN) 50 MG capsule Take 200 mg by mouth daily. 03/13/20  Yes [provider]  aspirin EC 81 MG tablet Take 81 mg by mouth daily.    [provider]  atorvastatin (LIPITOR) 10 MG tablet TAKE 1 TABLET BY MOUTH EVERY DAY Patient taking differently: Take 10 mg by mouth once a week. 08/11/19   Malfi, [name], FNP  Blood Glucose Monitoring Suppl (ONE TOUCH ULTRA 2) w/Device KIT 1 Device by Does not apply route in the morning and at bedtime. 09/09/20   [name], NP  Cholecalciferol (VITAMIN D-3) 125 MCG (5000 UT) TABS Take 2 capsules by mouth daily.    [provider]  Continuous Blood Gluc Sensor (FREESTYLE LIBRE 14 DAY SENSOR) MISC USE AS DIRECTED TO CHECK BLOOD GLUCOSE. CHANGE EVERY 14 DAYS 06/03/20    [name], NP  Garlic 390 MG TABS Take by mouth.    [provider]  glucose blood (ONETOUCH ULTRA) test strip Check blood sugar up to three times a day 09/09/20   [name], NP  hydrocortisone 2.5 % lotion Apply topically 2 (two) times daily. 11/19/19   Malfi, [name], FNP  Lancets [facility] ULTRASOFT) lancets Use as instructed 09/09/20   [name], NP  Magnesium 100 MG TABS Take by mouth.    [provider]  multivitamin-iron-minerals-folic acid (CENTRUM) chewable tablet Chew 1 tablet by mouth daily.    [provider]  Naproxen Sod-diphenhydrAMINE 220-25 MG TABS Take 2 tablets by mouth at bedtime as needed.    [provider]  TRULICITY 3.00 PQ/3.3AQ SOPN INJECT 0.75 MG INTO THE SKIN ONCE A WEEK. 08/23/20   [name], NP  Turmeric 400 MG CAPS Take by mouth.    [provider]    Allergies as of 09/15/2020 - Review Complete 09/09/2020  Allergen Reaction Noted   Covid-19 (mrna) vaccine (pfizer) [covid-19 (mrna) vaccine]  10/03/2019    Family History  Problem Relation Age of Onset   Heart disease Father    Diabetes Maternal Grandmother    Diabetes Paternal Grandmother     Social History   Socioeconomic History   Marital status:  Married    Spouse name: Not on file   Number of children: 2   Years of education: 12   Highest education level: High school graduate  Occupational History   Occupation: Games developer    Comment: Self-employed  Tobacco Use   Smoking status: Never   Smokeless tobacco: Never  Vaping Use   Vaping Use: Never used  Substance and Sexual Activity   Alcohol use: Yes    Comment: > 2 years ago had rare alcohol intake   Drug use: No   Sexual activity: Yes    Partners: Female    Comment: Married  Other Topics Concern   Not on file  Social History Narrative   Lives at home w/ his wife   Right-handed   Caffeine: three 16 oz drinks daily   Social Determinants of Health   Financial Resource  Strain: Not on file  Food Insecurity: Not on file  Transportation Needs: Not on file  Physical Activity: Not on file  Stress: Not on file  Social Connections: Not on file  Intimate Partner Violence: Not on file    Review of Systems: See HPI, otherwise negative ROS  Physical Exam: BP 113/86   Pulse (!) 121   Temp (!) 97.3 F (36.3 C) (Temporal)   Resp 18   Ht 6' (1.829 m)   Wt 97.5 kg   SpO2 99%   BMI 29.16 kg/m  General:   Alert,  pleasant and cooperative in NAD Head:  Normocephalic and atraumatic. Neck:  Supple; no masses or thyromegaly. Lungs:  Clear throughout to auscultation, normal respiratory effort.    Heart:  +S1, +S2, Regular rate and rhythm, No edema. Abdomen:  Soft, nontender and nondistended. Normal bowel sounds, without guarding, and without rebound.   Neurologic:  Alert and  oriented x4;  grossly normal neurologically.  Impression/Plan: Bruce Brandt is here for an colonoscopy to be performed for Screening colonoscopy - personal history of colon polyps unknown histology Risks, benefits, limitations, and alternatives regarding  colonoscopy have been reviewed with the patient.  Questions have been answered.  All parties agreeable.   Bruce Bellows, MD  10/17/2020, 9:50 AM

## 2020-10-17 NOTE — Transfer of Care (Signed)
Immediate Anesthesia Transfer of Care Note  Patient: Bruce Brandt  Procedure(s) Performed: COLONOSCOPY  Patient Location: PACU and Endoscopy Unit  Anesthesia Type:General  Level of Consciousness: drowsy and patient cooperative  Airway & Oxygen Therapy: Patient Spontanous Breathing  Post-op Assessment: Report given to RN and Post -op Vital signs reviewed and stable  Post vital signs: Reviewed and stable  Last Vitals:  Vitals Value Taken Time  BP 93/71 10/17/20 1048  Temp    Pulse 115 10/17/20 1052  Resp 22 10/17/20 1052  SpO2 95 % 10/17/20 1052  Vitals shown include unvalidated device data.  Last Pain:  Vitals:   10/17/20 0938  TempSrc: Temporal  PainSc: 0-No pain         Complications: No notable events documented.

## 2020-10-20 ENCOUNTER — Encounter: Payer: Self-pay | Admitting: Gastroenterology

## 2020-10-22 ENCOUNTER — Other Ambulatory Visit: Payer: Self-pay | Admitting: Internal Medicine

## 2020-10-22 DIAGNOSIS — E1165 Type 2 diabetes mellitus with hyperglycemia: Secondary | ICD-10-CM

## 2020-10-22 NOTE — Telephone Encounter (Signed)
Requested medication (s) are due for refill today: yes  Requested medication (s) are on the active medication list: yes  Last refill:  08/23/20 #6 ml 0 refills  Future visit scheduled: yes in 1 month  Notes to clinic:  please clarify dose. PCP notes from 09/09/20 , consider change in dose after labs back , no orders noted. To pharmacy: DX Code Needed  PATIENT STATES THEY ARE TO BE TAKING TWO SHOTS A WEEK NOW     Requested Prescriptions  Pending Prescriptions Disp Refills   TRULICITY 0.75 MG/0.5ML SOPN [Pharmacy Med Name: TRULICITY 0.75 MG/0.5 ML PEN]      Sig: INJECT 0.75 MG INTO THE SKIN ONCE A WEEK.     Endocrinology:  Diabetes - GLP-1 Receptor Agonists Failed - 10/22/2020  5:17 PM      Failed - HBA1C is between 0 and 7.9 and within 180 days    Hgb A1c MFr Bld  Date Value Ref Range Status  09/09/2020 9.1 (H) <5.7 % of total Hgb Final    Comment:    For someone without known diabetes, a hemoglobin A1c value of 6.5% or greater indicates that they may have  diabetes and this should be confirmed with a follow-up  test. . For someone with known diabetes, a value <7% indicates  that their diabetes is well controlled and a value  greater than or equal to 7% indicates suboptimal  control. A1c targets should be individualized based on  duration of diabetes, age, comorbid conditions, and  other considerations. . Currently, no consensus exists regarding use of hemoglobin A1c for diagnosis of diabetes for children. Verna Czech - Valid encounter within last 6 months    Recent Outpatient Visits           1 month ago Encounter for general adult medical examination with abnormal findings   [facility], Salvadore Oxford, NP   4 months ago Diabetic peripheral neuropathy associated with type 2 diabetes mellitus [facility])   [facility], Salvadore Oxford, NP   8 months ago Type 2 diabetes mellitus with hyperglycemia, without long-term current use of insulin  [facility])   [facility], Jodelle Gross, FNP   11 months ago Type 2 diabetes mellitus with hyperglycemia, without long-term current use of insulin [facility])   [facility], Jodelle Gross, FNP   1 year ago COVID toes   [facility], Jodelle Gross, FNP       Future Appointments             In 1 month Baity, Salvadore Oxford, NP [facility], [facility]

## 2020-11-25 ENCOUNTER — Other Ambulatory Visit: Payer: Self-pay | Admitting: Internal Medicine

## 2020-11-25 DIAGNOSIS — E1165 Type 2 diabetes mellitus with hyperglycemia: Secondary | ICD-10-CM

## 2020-12-11 ENCOUNTER — Ambulatory Visit: Payer: 59 | Admitting: Internal Medicine

## 2020-12-15 ENCOUNTER — Ambulatory Visit (INDEPENDENT_AMBULATORY_CARE_PROVIDER_SITE_OTHER): Payer: 59 | Admitting: Internal Medicine

## 2020-12-15 ENCOUNTER — Encounter: Payer: Self-pay | Admitting: Internal Medicine

## 2020-12-15 ENCOUNTER — Other Ambulatory Visit: Payer: Self-pay

## 2020-12-15 VITALS — BP 124/86 | HR 98 | Temp 97.5°F | Resp 17 | Ht 72.0 in | Wt 211.8 lb

## 2020-12-15 DIAGNOSIS — E782 Mixed hyperlipidemia: Secondary | ICD-10-CM | POA: Diagnosis not present

## 2020-12-15 DIAGNOSIS — E1165 Type 2 diabetes mellitus with hyperglycemia: Secondary | ICD-10-CM

## 2020-12-15 DIAGNOSIS — E663 Overweight: Secondary | ICD-10-CM

## 2020-12-15 DIAGNOSIS — Z6828 Body mass index (BMI) 28.0-28.9, adult: Secondary | ICD-10-CM | POA: Diagnosis not present

## 2020-12-15 DIAGNOSIS — Z23 Encounter for immunization: Secondary | ICD-10-CM

## 2020-12-15 NOTE — Assessment & Plan Note (Signed)
Lipid profile today Encouraged him to consume a low fat diet Continue Atorvastatin 

## 2020-12-15 NOTE — Assessment & Plan Note (Signed)
Encouraged diet and exercise for weight loss ?

## 2020-12-15 NOTE — Assessment & Plan Note (Signed)
A1C today Encouraged low carb diet and exercise for weight loss Continue Trulicity, Jardience and Metformin for now Encouraged routine eye exam, he declines referral for opthalomology Encouraged routine foot exams Flu shot today He declines pneumovax Encouraged him to get his covid booster

## 2020-12-15 NOTE — Progress Notes (Signed)
Subjective:    Patient ID: Bruce Brandt, male    DOB: 12-20-1970, 50 y.o.   MRN: [id number]  HPI  Patient presents the clinic today for 62-monthfollow-up of HLD, DM2:  HLD: His last LDL was 128, triglycerides 165, 09/2020.  He denies myalgias on Atorvastatin.  He has been trying to consume a low-fat diet.  DM2 with Peripheral Neuropathy: His last A1c was 9.1%, 09/2018 10.  He is taking Trulicity, Jardiance and Metformin as prescribed.  His sugars range 118-280.  He checks his feet routinely.  His last eye exam was more than 1 year ago.  Flu 11/2019.  Pneumovax never.  CArboriculturist  Review of Systems     Past Medical History:  Diagnosis Date   Carpal tunnel syndrome, bilateral 05/18/2016   Diabetes (HCC)    Frequent headaches    Gingivitis     Current Outpatient Medications  Medication Sig Dispense Refill   aspirin EC 81 MG tablet Take 81 mg by mouth daily.     atorvastatin (LIPITOR) 10 MG tablet TAKE 1 TABLET BY MOUTH EVERY DAY (Patient taking differently: Take 10 mg by mouth once a week.) 90 tablet 0   Blood Glucose Monitoring Suppl (ONE TOUCH ULTRA 2) w/Device KIT 1 Device by Does not apply route in the morning and at bedtime. 1 kit 0   Cholecalciferol (VITAMIN D-3) 125 MCG (5000 UT) TABS Take 2 capsules by mouth daily.     Continuous Blood Gluc Sensor (FREESTYLE LIBRE 14 DAY SENSOR) MISC USE AS DIRECTED TO CHECK BLOOD GLUCOSE. CHANGE EVERY 14 DAYS 6 each 1   Dulaglutide (TRULICITY) 05.88MFO/2.7XASOPN Inject 1.5 mg into the skin once a week. 4 mL 2   empagliflozin (JARDIANCE) 25 MG TABS tablet TAKE 1 TABLET (25 MG TOTAL) BY MOUTH DAILY. 90 tablet 3   Garlic 1128MG TABS Take by mouth.     glucose blood (ONETOUCH ULTRA) test strip Check blood sugar up to three times a day 100 each 12   hydrocortisone 2.5 % lotion Apply topically 2 (two) times daily. 59 mL 0   imipramine (TOFRANIL) 25 MG tablet Take 100 mg by mouth daily.      Lancets (ONETOUCH ULTRASOFT) lancets Use as  instructed 100 each 12   Magnesium 100 MG TABS Take by mouth.     metFORMIN (GLUCOPHAGE) 850 MG tablet TAKE 1 TABLET (850 MG TOTAL) BY MOUTH 2 (TWO) TIMES DAILY WITH A MEAL. 180 tablet 0   multivitamin-iron-minerals-folic acid (CENTRUM) chewable tablet Chew 1 tablet by mouth daily.     Naproxen Sod-diphenhydrAMINE 220-25 MG TABS Take 2 tablets by mouth at bedtime as needed.     Turmeric 400 MG CAPS Take by mouth.     zonisamide (ZONEGRAN) 25 MG capsule Take 100 mg by mouth daily.     zonisamide (ZONEGRAN) 50 MG capsule Take 200 mg by mouth daily.     No current facility-administered medications for this visit.    Allergies  Allergen Reactions   Covid-19 (Mrna) Vaccine (Therapist, music [Covid-19 (Mrna) Vaccine]     Bruising under great toe on both feet 1 week s/p Moderna Immunization    Family History  Problem Relation Age of Onset   Heart disease Father    Diabetes Maternal Grandmother    Diabetes Paternal Grandmother     Social History   Socioeconomic History   Marital status: Married    Spouse name: Not on file   Number of children: 2  Years of education: 22   Highest education level: High school graduate  Occupational History   Occupation: Games developer    Comment: Self-employed  Tobacco Use   Smoking status: Never   Smokeless tobacco: Never  Vaping Use   Vaping Use: Never used  Substance and Sexual Activity   Alcohol use: Yes    Comment: > 2 years ago had rare alcohol intake   Drug use: No   Sexual activity: Yes    Partners: Female    Comment: Married  Other Topics Concern   Not on file  Social History Narrative   Lives at home w/ his wife   Right-handed   Caffeine: three 16 oz drinks daily   Social Determinants of Health   Financial Resource Strain: Not on file  Food Insecurity: Not on file  Transportation Needs: Not on file  Physical Activity: Not on file  Stress: Not on file  Social Connections: Not on file  Intimate Partner Violence: Not on file      Constitutional: Denies fever, malaise, fatigue, headache or abrupt weight changes.  HEENT: Denies eye pain, eye redness, ear pain, ringing in the ears, wax buildup, runny nose, nasal congestion, bloody nose, or sore throat. Respiratory: Denies difficulty breathing, shortness of breath, cough or sputum production.   Cardiovascular: Denies chest pain, chest tightness, palpitations or swelling in the hands or feet.  Gastrointestinal: Denies abdominal pain, bloating, constipation, diarrhea or blood in the stool.  GU: Denies urgency, frequency, pain with urination, burning sensation, blood in urine, odor or discharge. Skin: Denies redness, rashes, lesions or ulcercations.  Neurological: Pt reports neuropathic pain in feet. Denies dizziness, difficulty with memory, difficulty with speech or problems with balance and coordination.    No other specific complaints in a complete review of systems (except as listed in HPI above).  Objective:   Physical Exam  BP 124/86 (BP Location: Right Arm, Patient Position: Sitting, Cuff Size: Normal)   Pulse 98   Temp (!) 97.5 F (36.4 C) (Temporal)   Resp 17   Ht 6' (1.829 m)   Wt 211 lb 12.8 oz (96.1 kg)   SpO2 98%   BMI 28.73 kg/m   Wt Readings from Last 3 Encounters:  10/17/20 215 lb (97.5 kg)  09/09/20 213 lb 6.4 oz (96.8 kg)  06/03/20 214 lb 6.4 oz (97.3 kg)    General: Appears his stated age, overweight, in NAD. Skin: Warm, dry and intact. No ulcerations noted. HEENT: Head: normal shape and size; Eyes: EOMs intact;  Cardiovascular: Normal rate and rhythm. S1,S2 noted.  No murmur, rubs or gallops noted. No JVD or BLE edema. No carotid bruits noted. Pulmonary/Chest: Normal effort and positive vesicular breath sounds. No respiratory distress. No wheezes, rales or ronchi noted.  Neurological: Alert and oriented.   BMET    Component Value Date/Time   NA 138 09/09/2020 0820   K 5.2 09/09/2020 0820   CL 105 09/09/2020 0820   CO2 25  09/09/2020 0820   GLUCOSE 222 (H) 09/09/2020 0820   BUN 18 09/09/2020 0820   CREATININE 0.70 09/09/2020 0820   CALCIUM 10.0 09/09/2020 0820   GFRNONAA 119 05/10/2019 0839   GFRAA 138 05/10/2019 0839    Lipid Panel     Component Value Date/Time   CHOL 224 (H) 09/09/2020 0820   TRIG 165 (H) 09/09/2020 0820   HDL 68 09/09/2020 0820   CHOLHDL 3.3 09/09/2020 0820   LDLCALC 128 (H) 09/09/2020 0820    CBC  Component Value Date/Time   WBC 7.2 09/09/2020 0820   RBC 5.68 09/09/2020 0820   HGB 16.5 09/09/2020 0820   HCT 50.8 (H) 09/09/2020 0820   PLT 232 09/09/2020 0820   MCV 89.4 09/09/2020 0820   MCH 29.0 09/09/2020 0820   MCHC 32.5 09/09/2020 0820   RDW 12.4 09/09/2020 0820   LYMPHSABS 1,489 05/10/2019 0839   MONOABS 0.4 11/17/2017 0957   EOSABS 58 05/10/2019 0839   BASOSABS 32 05/10/2019 0839    Hgb A1C Lab Results  Component Value Date   HGBA1C 9.1 (H) 09/09/2020            Assessment & Plan:    Webb Silversmith, NP This visit occurred during the SARS-CoV-2 public health emergency.  Safety protocols were in place, including screening questions prior to the visit, additional usage of staff PPE, and extensive cleaning of exam room while observing appropriate contact time as indicated for disinfecting solutions.

## 2020-12-15 NOTE — Patient Instructions (Signed)

## 2020-12-16 LAB — HEMOGLOBIN A1C
Hgb A1c MFr Bld: 8.3 % of total Hgb — ABNORMAL HIGH (ref <5.7)
Mean Plasma Glucose: 192 mg/dL
eAG (mmol/L): 10.6 mmol/L

## 2020-12-16 LAB — LIPID PANEL
Cholesterol: 247 mg/dL — ABNORMAL HIGH (ref <200)
HDL: 60 mg/dL (ref >=40)
LDL Cholesterol (Calc): 156 mg/dL (calc) — ABNORMAL HIGH
Non-HDL Cholesterol (Calc): 187 mg/dL (calc) — ABNORMAL HIGH (ref <130)
Total CHOL/HDL Ratio: 4.1 (calc) (ref <5.0)
Triglycerides: 170 mg/dL — ABNORMAL HIGH (ref <150)

## 2020-12-18 MED ORDER — GLIPIZIDE 10 MG PO TABS: 10.0000 mg | ORAL_TABLET | Freq: Two times a day (BID) | ORAL | 2 refills | Status: DC

## 2020-12-18 NOTE — Addendum Note (Signed)
Addended by: Lorre Munroe on: 12/18/2020 07:48 AM   Modules accepted: Orders

## 2020-12-29 ENCOUNTER — Other Ambulatory Visit: Payer: Self-pay | Admitting: Internal Medicine

## 2020-12-29 DIAGNOSIS — E1165 Type 2 diabetes mellitus with hyperglycemia: Secondary | ICD-10-CM

## 2020-12-29 NOTE — Telephone Encounter (Signed)
Requested medications are due for refill today.  yes  Requested medications are on the active medications list.  yes  Last refill. 09/07/2020  Future visit scheduled.   no  Notes to clinic.  Rx states take daily. Pt is taking 3 a week per conversation 12/16/2020. Please verify.

## 2020-12-29 NOTE — Telephone Encounter (Signed)
Copied from CRM [id number]. Topic: Quick Communication - Rx Refill/Question >> Dec 29, 2020  3:27 PM Jaquita Rector A wrote: Medication: atorvastatin (LIPITOR) 10 MG tablet  Has the patient contacted their pharmacy? Yes.  No reply  (Agent: If no, request that the patient contact the pharmacy for the refill. If patient does not wish to contact the pharmacy document the reason why and proceed with request.) (Agent: If yes, when and what did the pharmacy advise?)  Preferred Pharmacy (with phone number or street name): [id number] [place], [place] - [address]  Phone:  [phone] Fax:  [phone]    Has the patient been seen for an appointment in the last year OR does the patient have an upcoming appointment? Yes.    Agent: Please be advised that RX refills may take up to 3 business days. We ask that you follow-up with your pharmacy.

## 2020-12-30 MED ORDER — ATORVASTATIN CALCIUM 10 MG PO TABS: 10.0000 mg | ORAL_TABLET | Freq: Every day | ORAL | 0 refills | Status: DC

## 2021-02-07 ENCOUNTER — Other Ambulatory Visit: Payer: Self-pay | Admitting: Internal Medicine

## 2021-02-10 NOTE — Telephone Encounter (Signed)
Requested Prescriptions  Pending Prescriptions Disp Refills   glipiZIDE (GLUCOTROL) 10 MG tablet [Pharmacy Med Name: GLIPIZIDE 10 MG TABLET] 180 tablet     Sig: TAKE 1 TABLET (10 MG TOTAL) BY MOUTH 2 (TWO) TIMES DAILY BEFORE A MEAL.     Endocrinology:  Diabetes - Sulfonylureas Failed - 02/07/2021  4:40 PM      Failed - HBA1C is between 0 and 7.9 and within 180 days    Hgb A1c MFr Bld  Date Value Ref Range Status  12/15/2020 8.3 (H) <5.7 % of total Hgb Final    Comment:    For someone without known diabetes, a hemoglobin A1c value of 6.5% or greater indicates that they may have  diabetes and this should be confirmed with a follow-up  test. . For someone with known diabetes, a value <7% indicates  that their diabetes is well controlled and a value  greater than or equal to 7% indicates suboptimal  control. A1c targets should be individualized based on  duration of diabetes, age, comorbid conditions, and  other considerations. . Currently, no consensus exists regarding use of hemoglobin A1c for diagnosis of diabetes for children. Renella Cunas - Valid encounter within last 6 months    Recent Outpatient Visits          1 month ago Need for immunization against influenza   [facility], Coralie Keens, NP   5 months ago Encounter for general adult medical examination with abnormal findings   [facility] [place], Coralie Keens, NP   8 months ago Diabetic peripheral neuropathy associated with type 2 diabetes mellitus [facility])   [facility], NP   11 months ago Type 2 diabetes mellitus with hyperglycemia, without long-term current use of insulin [facility])   [facility], [name], FNP   1 year ago Type 2 diabetes mellitus with hyperglycemia, without long-term current use of insulin [facility])   [facility], [name], [place]

## 2021-02-27 ENCOUNTER — Other Ambulatory Visit: Payer: Self-pay | Admitting: Internal Medicine

## 2021-02-27 DIAGNOSIS — E1165 Type 2 diabetes mellitus with hyperglycemia: Secondary | ICD-10-CM

## 2021-02-27 NOTE — Telephone Encounter (Signed)
Requested Prescriptions  Pending Prescriptions Disp Refills   metFORMIN (GLUCOPHAGE) 850 MG tablet [Pharmacy Med Name: METFORMIN HCL 850 MG TABLET] 180 tablet 0    Sig: TAKE 1 TABLET (850 MG TOTAL) BY MOUTH 2 (TWO) TIMES DAILY WITH A MEAL.     Endocrinology:  Diabetes - Biguanides Failed - 02/27/2021  1:40 AM      Failed - HBA1C is between 0 and 7.9 and within 180 days    Hgb A1c MFr Bld  Date Value Ref Range Status  12/15/2020 8.3 (H) <5.7 % of total Hgb Final    Comment:    For someone without known diabetes, a hemoglobin A1c value of 6.5% or greater indicates that they may have  diabetes and this should be confirmed with a follow-up  test. . For someone with known diabetes, a value <7% indicates  that their diabetes is well controlled and a value  greater than or equal to 7% indicates suboptimal  control. A1c targets should be individualized based on  duration of diabetes, age, comorbid conditions, and  other considerations. . Currently, no consensus exists regarding use of hemoglobin A1c for diagnosis of diabetes for children. .          Passed - Cr in normal range and within 360 days    Creat  Date Value Ref Range Status  09/09/2020 0.70 0.70 - 1.30 mg/dL Final   Creatinine, Urine  Date Value Ref Range Status  06/03/2020 39 20 - 320 mg/dL Final         Passed - eGFR in normal range and within 360 days    GFR, Est African American  Date Value Ref Range Status  05/10/2019 138 > OR = 60 mL/min/1.39m Final   GFR, Est Non African American  Date Value Ref Range Status  05/10/2019 119 > OR = 60 mL/min/1.772mFinal   eGFR  Date Value Ref Range Status  09/09/2020 112 > OR = 60 mL/min/1.7350minal    Comment:    The eGFR is based on the CKD-EPI 2021 equation. To calculate  the new eGFR from a previous Creatinine or Cystatin C result, go to https://www.kidney.org/professionals/ kdoqi/gfr%5Fcalculator          Passed - Valid encounter within last 6 months     Recent Outpatient Visits          2 months ago Need for immunization against influenza   [facility]   5 months ago Encounter for general adult medical examination with abnormal findings   [facility]   8 months ago Diabetic peripheral neuropathy associated with type 2 diabetes mellitus ([facility]   1 year ago Type 2 diabetes mellitus with hyperglycemia, without long-term current use of insulin [facility]   1 year ago Type 2 diabetes mellitus with hyperglycemia, without long-term current use of insulin ([facility] [place]

## 2021-03-25 ENCOUNTER — Other Ambulatory Visit: Payer: Self-pay | Admitting: Internal Medicine

## 2021-03-25 DIAGNOSIS — E1165 Type 2 diabetes mellitus with hyperglycemia: Secondary | ICD-10-CM

## 2021-03-25 NOTE — Telephone Encounter (Signed)
Requested medications are due for refill today.  yes  Requested medications are on the active medications list.  yes  Last refill. 10/23/2020 4 / 2 refills  Future visit scheduled.  no  Notes to clinic.  Pharmacy needs dx code.    Requested Prescriptions  Pending Prescriptions Disp Refills   TRULICITY 0.75 MG/0.5ML SOPN [Pharmacy Med Name: TRULICITY 0.75 MG/0.5 ML PEN]  1    Sig: Inject 1.5 mg into the skin once a week.     Endocrinology:  Diabetes - GLP-1 Receptor Agonists Failed - 03/25/2021  4:20 PM      Failed - HBA1C is between 0 and 7.9 and within 180 days    Hgb A1c MFr Bld  Date Value Ref Range Status  12/15/2020 8.3 (H) <5.7 % of total Hgb Final    Comment:    For someone without known diabetes, a hemoglobin A1c value of 6.5% or greater indicates that they may have  diabetes and this should be confirmed with a follow-up  test. . For someone with known diabetes, a value <7% indicates  that their diabetes is well controlled and a value  greater than or equal to 7% indicates suboptimal  control. A1c targets should be individualized based on  duration of diabetes, age, comorbid conditions, and  other considerations. . Currently, no consensus exists regarding use of hemoglobin A1c for diagnosis of diabetes for children. Verna Czech - Valid encounter within last 6 months    Recent Outpatient Visits           3 months ago Need for immunization against influenza   [facility] [place], Salvadore Oxford, NP   6 months ago Encounter for general adult medical examination with abnormal findings   [facility] [place], Salvadore Oxford, NP   9 months ago Diabetic peripheral neuropathy associated with type 2 diabetes mellitus [facility])   [facility], Salvadore Oxford, NP   1 year ago Type 2 diabetes mellitus with hyperglycemia, without long-term current use of insulin [facility])   [facility], Jodelle Gross, FNP   1 year ago Type  2 diabetes mellitus with hyperglycemia, without long-term current use of insulin [facility])   [facility], Jodelle Gross, [place]

## 2021-04-02 ENCOUNTER — Other Ambulatory Visit: Payer: Self-pay | Admitting: Internal Medicine

## 2021-04-02 DIAGNOSIS — E1165 Type 2 diabetes mellitus with hyperglycemia: Secondary | ICD-10-CM

## 2021-04-02 NOTE — Telephone Encounter (Signed)
Requested medication (s) are due for refill today: yes   Requested medication (s) are on the active medication list: yes  Last refill:  na   Future visit scheduled: yes in 1 week   Notes to clinic:  patient reports he has not taken medication in 2 weeks due to pharmacy refusal for refills. Appt scheduled 04/13/21 and patient will only come in at 0800. Patient want to speak with PCP due to medication being refused x 2 weeks. Please advise . Do you want to refill Rx?      Requested Prescriptions  Pending Prescriptions Disp Refills   TRULICITY 0.75 MG/0.5ML SOPN [Pharmacy Med Name: TRULICITY 0.75 MG/0.5 ML PEN]  1    Sig: Inject 1.5 mg into the skin once a week.     Endocrinology:  Diabetes - GLP-1 Receptor Agonists Failed - 04/02/2021  4:17 PM      Failed - HBA1C is between 0 and 7.9 and within 180 days    Hgb A1c MFr Bld  Date Value Ref Range Status  12/15/2020 8.3 (H) <5.7 % of total Hgb Final    Comment:    For someone without known diabetes, a hemoglobin A1c value of 6.5% or greater indicates that they may have  diabetes and this should be confirmed with a follow-up  test. . For someone with known diabetes, a value <7% indicates  that their diabetes is well controlled and a value  greater than or equal to 7% indicates suboptimal  control. A1c targets should be individualized based on  duration of diabetes, age, comorbid conditions, and  other considerations. . Currently, no consensus exists regarding use of hemoglobin A1c for diagnosis of diabetes for children. Verna Czech - Valid encounter within last 6 months    Recent Outpatient Visits           3 months ago Need for immunization against influenza   [facility] [place], Salvadore Oxford, NP   6 months ago Encounter for general adult medical examination with abnormal findings   [facility] [place], Salvadore Oxford, NP   10 months ago Diabetic peripheral neuropathy associated with type 2 diabetes  mellitus [facility])   [facility], Salvadore Oxford, NP   1 year ago Type 2 diabetes mellitus with hyperglycemia, without long-term current use of insulin [facility])   [facility], Jodelle Gross, FNP   1 year ago Type 2 diabetes mellitus with hyperglycemia, without long-term current use of insulin [facility])   [facility], Jodelle Gross, FNP       Future Appointments             In 1 week Baity, Salvadore Oxford, NP [facility]

## 2021-04-09 ENCOUNTER — Other Ambulatory Visit: Payer: Self-pay | Admitting: Internal Medicine

## 2021-04-09 DIAGNOSIS — E1165 Type 2 diabetes mellitus with hyperglycemia: Secondary | ICD-10-CM

## 2021-04-09 NOTE — Telephone Encounter (Signed)
Requested Prescriptions  ?Pending Prescriptions Disp Refills  ?? atorvastatin (LIPITOR) 10 MG tablet [Pharmacy Med Name: ATORVASTATIN 10 MG TABLET] 90 tablet 0  ?  Sig: TAKE 1 TABLET BY MOUTH EVERY DAY  ?  ? Cardiovascular:  Antilipid - Statins Failed - 04/09/2021  2:11 AM  ?  ?  Failed - Lipid Panel in normal range within the last 12 months  ?  Cholesterol  ?Date Value Ref Range Status  ?12/15/2020 247 (H) <200 mg/dL Final  ? ?LDL Cholesterol (Calc)  ?Date Value Ref Range Status  ?12/15/2020 156 (H) mg/dL (calc) Final  ?  Comment:  ?  Reference range: <100 ?Marland Kitchen ?Desirable range <100 mg/dL for primary prevention;   ?<70 mg/dL for patients with CHD or diabetic patients  ?with > or = 2 CHD risk factors. ?. ?LDL-C is now calculated using the Martin-Hopkins  ?calculation, which is a validated novel method providing  ?better accuracy than the Friedewald equation in the  ?estimation of LDL-C.  ?Horald Pollen et al. Lenox Ahr. 2563;893(73): 2061-2068  ?(http://education.QuestDiagnostics.com/faq/FAQ164) ?  ? ?HDL  ?Date Value Ref Range Status  ?12/15/2020 60 > OR = 40 mg/dL Final  ? ?Triglycerides  ?Date Value Ref Range Status  ?12/15/2020 170 (H) <150 mg/dL Final  ? ?  ?  ?  Passed - Patient is not pregnant  ?  ?  Passed - Valid encounter within last 12 months  ?  Recent Outpatient Visits   ?      ? 3 months ago Need for immunization against influenza  ? [facility] [place], Salvadore Oxford, NP  ? 7 months ago Encounter for general adult medical examination with abnormal findings  ? [facility] [place], [place] W, NP  ? 10 months ago Diabetic peripheral neuropathy associated with type 2 diabetes mellitus (HCC)  ? [facility] [place], [place], NP  ? 1 year ago Type 2 diabetes mellitus with hyperglycemia, without long-term current use of insulin (HCC)  ? [facility], Jodelle Gross, FNP  ? 1 year ago Type 2 diabetes mellitus with hyperglycemia, without long-term current use of insulin  (HCC)  ? [facility], Jodelle Gross, FNP  ?  ?  ?Future Appointments   ?        ? In 6 days Baity, Salvadore Oxford, NP [facility]  ?  ? ?  ?  ?  ? ?

## 2021-04-13 ENCOUNTER — Ambulatory Visit: Payer: 59 | Admitting: Internal Medicine

## 2021-04-15 ENCOUNTER — Encounter: Payer: Self-pay | Admitting: Internal Medicine

## 2021-04-15 ENCOUNTER — Other Ambulatory Visit: Payer: Self-pay

## 2021-04-15 ENCOUNTER — Ambulatory Visit: Payer: 59 | Admitting: Internal Medicine

## 2021-04-15 VITALS — BP 128/79 | HR 94 | Temp 97.3°F | Wt 217.0 lb

## 2021-04-15 DIAGNOSIS — B351 Tinea unguium: Secondary | ICD-10-CM

## 2021-04-15 DIAGNOSIS — Z6829 Body mass index (BMI) 29.0-29.9, adult: Secondary | ICD-10-CM

## 2021-04-15 DIAGNOSIS — E7800 Pure hypercholesterolemia, unspecified: Secondary | ICD-10-CM | POA: Diagnosis not present

## 2021-04-15 DIAGNOSIS — E663 Overweight: Secondary | ICD-10-CM

## 2021-04-15 DIAGNOSIS — E1165 Type 2 diabetes mellitus with hyperglycemia: Secondary | ICD-10-CM

## 2021-04-15 LAB — POCT GLYCOSYLATED HEMOGLOBIN (HGB A1C): Hemoglobin A1C: 8.6 % — AB (ref 4.0–5.6)

## 2021-04-15 MED ORDER — TRULICITY 0.75 MG/0.5ML ~~LOC~~ SOAJ: 0.7500 mg | SUBCUTANEOUS | 0 refills | Status: DC

## 2021-04-15 NOTE — Assessment & Plan Note (Signed)
POCT A1c 8.6% ?Urine microalbumin has been checked within the last year ?We will D/C Metformin as he stopped this on his own ?Continue Glipizide as prescribed ?We will decrease Trulicity to 0.75 as he is having GI issues ?Discussed his diet and consumption of lots of carbs.  He is resistant to changing this.  He declines referral to nutrition at this time ?Referral to endocrinology placed for further evaluation and management ?Referral to ophthalmology for eye exam ?Foot exam today ?Flu shot UTD ?Encouraged him to get his COVID booster ?He declines Pneumovax ?

## 2021-04-15 NOTE — Assessment & Plan Note (Signed)
C-Met and lipid profile today ?Encouraged him to consume a low-fat diet ?Continue Atorvastatin ?

## 2021-04-15 NOTE — Progress Notes (Signed)
Subjective:    Patient ID: Bruce Brandt, male    DOB: 11-15-1970, 51 y.o.   MRN: [id number]  HPI  Patient presents the clinic today for 14-monthfollow-up of HLD and DM2.  HLD: His last LDL was 156, triglycerides 170, 12/2020.  He denies myalgias on Atorvastatin (increased to 3 times a week versus prior 2 times a week).  He tries to consume a low-fat diet.  DM2: His last A1c was 8.6%, 12/2018.  He is prescribed Metformin, Glipizide and Trulicity.  He reports he is currently taking Glipizide, Jardiance and Trulicity.   He stopped Metformin because he felt like it was making him feel run down.  He has also been having abdominal bloating, incomplete bowel emptying and intermittent loose stools.  He does consume a lot of carbs in his diet.  He reports he has been referred to nutritionist in the past and did find this helpful however with his job he has difficulty meal prepping.  His sugars range 147-300.  He checks his feet routinely.  His last eye exam was mor than 1 year ago.  Flu 12/2020.  Pneumovax never.  CArboriculturist  Review of Systems     Past Medical History:  Diagnosis Date   Carpal tunnel syndrome, bilateral 05/18/2016   Diabetes (HCC)    Frequent headaches    Gingivitis     Current Outpatient Medications  Medication Sig Dispense Refill   aspirin EC 81 MG tablet Take 81 mg by mouth daily.     atorvastatin (LIPITOR) 10 MG tablet TAKE 1 TABLET BY MOUTH EVERY DAY 90 tablet 0   baclofen (LIORESAL) 10 MG tablet Take by mouth.     Blood Glucose Monitoring Suppl (ONE TOUCH ULTRA 2) w/Device KIT 1 Device by Does not apply route in the morning and at bedtime. 1 kit 0   Cholecalciferol (VITAMIN D-3) 125 MCG (5000 UT) TABS Take 2 capsules by mouth daily.     Continuous Blood Gluc Sensor (FREESTYLE LIBRE 14 DAY SENSOR) MISC USE AS DIRECTED TO CHECK BLOOD GLUCOSE. CHANGE EVERY 14 DAYS 6 each 1   glipiZIDE (GLUCOTROL) 10 MG tablet TAKE 1 TABLET (10 MG TOTAL) BY MOUTH 2 (TWO) TIMES DAILY  BEFORE A MEAL. 180 tablet 0   glucose blood (ONETOUCH ULTRA) test strip Check blood sugar up to three times a day 100 each 12   hydrocortisone 2.5 % lotion Apply topically 2 (two) times daily. 59 mL 0   imipramine (TOFRANIL) 25 MG tablet Take 100 mg by mouth daily.      Lancets (ONETOUCH ULTRASOFT) lancets Use as instructed 100 each 12   Magnesium 100 MG TABS Take by mouth.     metFORMIN (GLUCOPHAGE) 850 MG tablet TAKE 1 TABLET (850 MG TOTAL) BY MOUTH 2 (TWO) TIMES DAILY WITH A MEAL. 180 tablet 0   multivitamin-iron-minerals-folic acid (CENTRUM) chewable tablet Chew 1 tablet by mouth daily.     Naproxen Sod-diphenhydrAMINE 220-25 MG TABS Take 2 tablets by mouth at bedtime as needed.     TRULICITY 06.37MCH/8.8FOSOPN INJECT 1.5 MG INTO THE SKIN ONCE A WEEK. 4 mL 0   Turmeric 400 MG CAPS Take by mouth.     zonisamide (ZONEGRAN) 25 MG capsule Take 100 mg by mouth daily.     zonisamide (ZONEGRAN) 50 MG capsule Take 200 mg by mouth daily.     No current facility-administered medications for this visit.    Allergies  Allergen Reactions   Covid-19 (Mrna)  Vaccine Therapist, music) [Covid-19 (Mrna) Vaccine]     Bruising under great toe on both feet 1 week s/p Moderna Immunization    Family History  Problem Relation Age of Onset   Heart disease Father    Diabetes Maternal Grandmother    Diabetes Paternal Grandmother     Social History   Socioeconomic History   Marital status: Married    Spouse name: Not on file   Number of children: 2   Years of education: 12   Highest education level: High school graduate  Occupational History   Occupation: Games developer    Comment: Self-employed  Tobacco Use   Smoking status: Never   Smokeless tobacco: Never  Vaping Use   Vaping Use: Never used  Substance and Sexual Activity   Alcohol use: Yes    Comment: > 2 years ago had rare alcohol intake   Drug use: No   Sexual activity: Yes    Partners: Female    Comment: Married  Other Topics Concern   Not on  file  Social History Narrative   Lives at home w/ his wife   Right-handed   Caffeine: three 16 oz drinks daily   Social Determinants of Health   Financial Resource Strain: Not on file  Food Insecurity: Not on file  Transportation Needs: Not on file  Physical Activity: Not on file  Stress: Not on file  Social Connections: Not on file  Intimate Partner Violence: Not on file     Constitutional: Patient reports intermittent headaches.  Denies fever, malaise, fatigue, or abrupt weight changes.  HEENT: Denies eye pain, eye redness, ear pain, ringing in the ears, wax buildup, runny nose, nasal congestion, bloody nose, or sore throat. Respiratory: Denies difficulty breathing, shortness of breath, cough or sputum production.   Cardiovascular: Denies chest pain, chest tightness, palpitations or swelling in the hands or feet.  Gastrointestinal: Patient reports abdominal bloating, incomplete bowel emptying and intermittent loose stools.  Denies abdominal pain, constipation, or blood in the stool.  GU: Patient reports erectile dysfunction.  Denies urgency, frequency, pain with urination, burning sensation, blood in urine, odor or discharge. Musculoskeletal: Patient reports chronic shoulder pain.  Denies decrease in range of motion, difficulty with gait, muscle pain or joint swelling.  Skin: Denies redness, rashes, lesions or ulcercations.  Neurological: Patient reports insomnia, numbness and tingling in his feet.  Denies dizziness, difficulty with memory, difficulty with speech or problems with balance and coordination.  Psych: Denies anxiety, depression, SI/HI.  No other specific complaints in a complete review of systems (except as listed in HPI above).  Objective:   Physical Exam  BP 128/79 (BP Location: Left Arm, Patient Position: Sitting, Cuff Size: Large)    Pulse 94    Temp (!) 97.3 F (36.3 C) (Temporal)    Wt 217 lb (98.4 kg)    SpO2 100%    BMI 29.43 kg/m   Wt Readings from Last 3  Encounters:  12/15/20 211 lb 12.8 oz (96.1 kg)  10/17/20 215 lb (97.5 kg)  09/09/20 213 lb 6.4 oz (96.8 kg)    General: Appears his stated age, overweight, in NAD. Skin: Warm, dry and intact. No ulcerations noted.  Fungal infection noted of right fourth toenail. HEENT: Head: normal shape and size; Eyes: sclera white and EOMs intact;  Cardiovascular: Normal rate and rhythm. S1,S2 noted.  No murmur, rubs or gallops noted. No JVD or BLE edema. No carotid bruits noted. Pulmonary/Chest: Normal effort and positive vesicular breath sounds. No respiratory distress.  No wheezes, rales or ronchi noted.  Abdomen: Normal bowel sounds.  Musculoskeletal: No difficulty with gait.  Neurological: Alert and oriented.  Sensation intact to BLE.  BMET    Component Value Date/Time   NA 138 09/09/2020 0820   K 5.2 09/09/2020 0820   CL 105 09/09/2020 0820   CO2 25 09/09/2020 0820   GLUCOSE 222 (H) 09/09/2020 0820   BUN 18 09/09/2020 0820   CREATININE 0.70 09/09/2020 0820   CALCIUM 10.0 09/09/2020 0820   GFRNONAA 119 05/10/2019 0839   GFRAA 138 05/10/2019 0839    Lipid Panel     Component Value Date/Time   CHOL 247 (H) 12/15/2020 0933   TRIG 170 (H) 12/15/2020 0933   HDL 60 12/15/2020 0933   CHOLHDL 4.1 12/15/2020 0933   LDLCALC 156 (H) 12/15/2020 0933    CBC    Component Value Date/Time   WBC 7.2 09/09/2020 0820   RBC 5.68 09/09/2020 0820   HGB 16.5 09/09/2020 0820   HCT 50.8 (H) 09/09/2020 0820   PLT 232 09/09/2020 0820   MCV 89.4 09/09/2020 0820   MCH 29.0 09/09/2020 0820   MCHC 32.5 09/09/2020 0820   RDW 12.4 09/09/2020 0820   LYMPHSABS 1,489 05/10/2019 0839   MONOABS 0.4 11/17/2017 0957   EOSABS 58 05/10/2019 0839   BASOSABS 32 05/10/2019 0839    Hgb A1C Lab Results  Component Value Date   HGBA1C 8.3 (H) 12/15/2020           Assessment & Plan:   Fungal Infection of Toenail:  Advised him to try Fungi-Nail OTC  RTC in 3 months for follow-up of chronic  conditions  Webb Silversmith, NP This visit occurred during the SARS-CoV-2 public health emergency.  Safety protocols were in place, including screening questions prior to the visit, additional usage of staff PPE, and extensive cleaning of exam room while observing appropriate contact time as indicated for disinfecting solutions.

## 2021-04-15 NOTE — Assessment & Plan Note (Signed)
Encourage diet and exercise for weight loss 

## 2021-04-15 NOTE — Patient Instructions (Signed)

## 2021-04-16 LAB — COMPLETE METABOLIC PANEL WITH GFR
AG Ratio: 2.1 (calc) (ref 1.0–2.5)
ALT: 30 U/L (ref 9–46)
AST: 16 U/L (ref 10–35)
Albumin: 4.9 g/dL (ref 3.6–5.1)
Alkaline phosphatase (APISO): 79 U/L (ref 35–144)
BUN: 15 mg/dL (ref 7–25)
CO2: 23 mmol/L (ref 20–32)
Calcium: 9.7 mg/dL (ref 8.6–10.3)
Chloride: 108 mmol/L (ref 98–110)
Creat: 0.75 mg/dL (ref 0.70–1.30)
Globulin: 2.3 g/dL (calc) (ref 1.9–3.7)
Glucose, Bld: 188 mg/dL — ABNORMAL HIGH (ref 65–99)
Potassium: 4.8 mmol/L (ref 3.5–5.3)
Sodium: 141 mmol/L (ref 135–146)
Total Bilirubin: 0.4 mg/dL (ref 0.2–1.2)
Total Protein: 7.2 g/dL (ref 6.1–8.1)
eGFR: 110 mL/min/{1.73_m2} (ref >=60)

## 2021-04-16 LAB — LIPID PANEL
Cholesterol: 217 mg/dL — ABNORMAL HIGH (ref <200)
HDL: 58 mg/dL (ref >=40)
LDL Cholesterol (Calc): 137 mg/dL (calc) — ABNORMAL HIGH
Non-HDL Cholesterol (Calc): 159 mg/dL (calc) — ABNORMAL HIGH (ref <130)
Total CHOL/HDL Ratio: 3.7 (calc) (ref <5.0)
Triglycerides: 114 mg/dL (ref <150)

## 2021-04-17 ENCOUNTER — Telehealth: Payer: Self-pay | Admitting: Internal Medicine

## 2021-04-17 NOTE — Telephone Encounter (Signed)
Morayati, Delsa Sale, MD office called to notify [facility] that they are out of network for the patient. Please advise with the patient. ? ?

## 2021-04-20 NOTE — Telephone Encounter (Signed)
Can we schedule him somewhere in his network for endocrinology ?

## 2021-05-11 ENCOUNTER — Telehealth: Payer: Self-pay | Admitting: Internal Medicine

## 2021-05-11 DIAGNOSIS — E1165 Type 2 diabetes mellitus with hyperglycemia: Secondary | ICD-10-CM

## 2021-05-11 MED ORDER — TRULICITY 1.5 MG/0.5ML ~~LOC~~ SOAJ: 1.5000 mg | SUBCUTANEOUS | 0 refills | Status: DC

## 2021-05-11 NOTE — Telephone Encounter (Signed)
Pt called and stated that the pharmacy advised that due to the way the RX Dulaglutide (TRULICITY) [id number] SOPN was written the insurance denied covering all of it/ please advise asap / pt stated he has only been getting 4 pens / so insurance is only paying for half the RX/ ?

## 2021-05-11 NOTE — Telephone Encounter (Signed)
Aquisi from Bruce Brandt is calling back wanting provider to wright a RX for  Trulicity 1.5 pen. ? ? ?  ?  ? ?

## 2021-05-11 NOTE — Addendum Note (Signed)
Addended by: [name] on: 05/11/2021 02:46 PM ? ? Modules accepted: Orders ? ?

## 2021-05-11 NOTE — Telephone Encounter (Signed)
RX sent to pharmacy  

## 2021-05-25 NOTE — Telephone Encounter (Signed)
Pt called and they were advised that the pharmacy did not have the right code  for the RX for Dulaglutide (TRULICITY) 1.5 MG/0.5ML SOPN / they need the correct code /the pt also stated that they moved to  ?[id number] [address], [place] [address] Phone:  [phone]  ?Fax:  [phone]  ?  ?And would like the RX for Dulaglutide (TRULICITY) 1.5 MG/0.5ML SOPN  to be resent there  ?

## 2021-05-25 NOTE — Telephone Encounter (Signed)
Call to pharmacy- nothing needed- Rx is ready for patient pick up. ?

## 2021-06-05 ENCOUNTER — Other Ambulatory Visit: Payer: Self-pay | Admitting: Internal Medicine

## 2021-06-05 DIAGNOSIS — E1165 Type 2 diabetes mellitus with hyperglycemia: Secondary | ICD-10-CM

## 2021-06-05 NOTE — Telephone Encounter (Signed)
Requested medication (s) are due for refill today:   No discontinued ? ?Requested medication (s) are on the active medication list:   No ? ?Future visit scheduled:   Yes ? ? ?Last ordered: discontinued 04/15/2021 by Rollene Fare. ? ?Returned because pharmacy sent this refill request.  ? ?Requested Prescriptions  ?Pending Prescriptions Disp Refills  ? metFORMIN (GLUCOPHAGE) 850 MG tablet [Pharmacy Med Name: METFORMIN HCL 850 MG TABLET] 60 tablet 2  ?  Sig: TAKE 1 TABLET (850 MG TOTAL) BY MOUTH 2 (TWO) TIMES DAILY WITH A MEAL.  ?  ? Endocrinology:  Diabetes - Biguanides Failed - 06/05/2021  3:08 AM  ?  ?  Failed - HBA1C is between 0 and 7.9 and within 180 days  ?  Hemoglobin A1C  ?Date Value Ref Range Status  ?04/15/2021 8.6 (A) 4.0 - 5.6 % Final  ? ?Hgb A1c MFr Bld  ?Date Value Ref Range Status  ?12/15/2020 8.3 (H) <5.7 % of total Hgb Final  ?  Comment:  ?  For someone without known diabetes, a hemoglobin A1c ?value of 6.5% or greater indicates that they may have  ?diabetes and this should be confirmed with a follow-up  ?test. ?. ?For someone with known diabetes, a value <7% indicates  ?that their diabetes is well controlled and a value  ?greater than or equal to 7% indicates suboptimal  ?control. A1c targets should be individualized based on  ?duration of diabetes, age, comorbid conditions, and  ?other considerations. ?. ?Currently, no consensus exists regarding use of ?hemoglobin A1c for diagnosis of diabetes for children. ?. ?  ?  ?  ?  ?  Failed - B12 Level in normal range and within 720 days  ?  No results found for: VITAMINB12  ?  ?  ?  Failed - CBC within normal limits and completed in the last 12 months  ?  WBC  ?Date Value Ref Range Status  ?09/09/2020 7.2 3.8 - 10.8 Thousand/uL Final  ? ?RBC  ?Date Value Ref Range Status  ?09/09/2020 5.68 4.20 - 5.80 Million/uL Final  ? ?Hemoglobin  ?Date Value Ref Range Status  ?09/09/2020 16.5 13.2 - 17.1 g/dL Final  ? ?HCT  ?Date Value Ref Range Status  ?09/09/2020 50.8 (H) 38.5  - 50.0 % Final  ? ?MCHC  ?Date Value Ref Range Status  ?09/09/2020 32.5 32.0 - 36.0 g/dL Final  ? ?MCH  ?Date Value Ref Range Status  ?09/09/2020 29.0 27.0 - 33.0 pg Final  ? ?MCV  ?Date Value Ref Range Status  ?09/09/2020 89.4 80.0 - 100.0 fL Final  ? ?No results found for: PLTCOUNTKUC, LABPLAT, [place] ?RDW  ?Date Value Ref Range Status  ?09/09/2020 12.4 11.0 - 15.0 % Final  ? ?  ?  ?  Passed - Cr in normal range and within 360 days  ?  Creat  ?Date Value Ref Range Status  ?04/15/2021 0.75 0.70 - 1.30 mg/dL Final  ? ?Creatinine, Urine  ?Date Value Ref Range Status  ?06/03/2020 39 20 - 320 mg/dL Final  ?  ?  ?  ?  Passed - eGFR in normal range and within 360 days  ?  GFR, Est African American  ?Date Value Ref Range Status  ?05/10/2019 138 > OR = 60 mL/min/1.87m Final  ? ?GFR, Est Non African American  ?Date Value Ref Range Status  ?05/10/2019 119 > OR = 60 mL/min/1.777mFinal  ? ?eGFR  ?Date Value Ref Range Status  ?04/15/2021 110 > OR = 60 mL/min/1.7355m  Final  ?  Comment:  ?  The eGFR is based on the CKD-EPI 2021 equation. To calculate  ?the new eGFR from a previous Creatinine or Cystatin C ?result, go to https://www.kidney.org/professionals/ ?kdoqi/gfr%5Fcalculator ?  ?  ?  ?  ?  Passed - Valid encounter within last 6 months  ?  Recent Outpatient Visits   ? ?      ? 1 month ago Type 2 diabetes mellitus with hyperglycemia, without long-term current use of insulin ([place])  ? [facility] [place], [place] W, NP  ? 5 months ago Need for immunization against influenza  ? [facility] [place], Coralie Keens, NP  ? 8 months ago Encounter for general adult medical examination with abnormal findings  ? [facility] [place], [place] W, NP  ? 1 year ago Diabetic peripheral neuropathy associated with type 2 diabetes mellitus ([place])  ? [facility] [place], [place] W, NP  ? 1 year ago Type 2 diabetes mellitus with hyperglycemia, without long-term current use of insulin ([place])  ? [facility], [name], FNP  ? ?  ?  ?Future Appointments   ? ?        ? In 1 month Baity, Coralie Keens, NP [facility], [place]  ? ?  ? ? ?  ?  ?  ? ?

## 2021-06-11 ENCOUNTER — Ambulatory Visit (INDEPENDENT_AMBULATORY_CARE_PROVIDER_SITE_OTHER): Payer: 59 | Admitting: Internal Medicine

## 2021-06-11 ENCOUNTER — Encounter: Payer: Self-pay | Admitting: Internal Medicine

## 2021-06-11 VITALS — BP 106/72 | HR 99 | Temp 96.9°F | Wt 219.0 lb

## 2021-06-11 DIAGNOSIS — E7800 Pure hypercholesterolemia, unspecified: Secondary | ICD-10-CM

## 2021-06-11 DIAGNOSIS — J029 Acute pharyngitis, unspecified: Secondary | ICD-10-CM | POA: Diagnosis not present

## 2021-06-11 DIAGNOSIS — E663 Overweight: Secondary | ICD-10-CM

## 2021-06-11 DIAGNOSIS — E1165 Type 2 diabetes mellitus with hyperglycemia: Secondary | ICD-10-CM

## 2021-06-11 DIAGNOSIS — Z6829 Body mass index (BMI) 29.0-29.9, adult: Secondary | ICD-10-CM

## 2021-06-11 LAB — POCT RAPID STREP A (OFFICE): Rapid Strep A Screen: NEGATIVE

## 2021-06-11 MED ORDER — ATORVASTATIN CALCIUM 10 MG PO TABS: 10.0000 mg | ORAL_TABLET | ORAL | 0 refills | Status: DC

## 2021-06-11 NOTE — Progress Notes (Signed)
HPI ? ?Pt presents to the clinic today with c/o nasal congestion, sore throat and cough. He reports this started 2 days ago.  He only reports nasal congestion and cough when he lays down at night.  He is unable to blow anything out of his nose.  The cough is not productive.  He is having some difficulty swallowing.  He denies headache, runny nose, ear pain, shortness of breath, nausea, vomiting or diarrhea.  He denies fever, chills or body aches.  He has had sick contacts diagnosed with strep.  He has tried Tylenol OTC with minimal relief of symptoms. ? ?He also reports he has increased his Atorvastatin to every day.  He overall just feels fatigued and does not feel good. ? ?He reports he has an appointment with endocrinology on June 12. ? ?Review of Systems ? ? ?   ?Past Medical History:  ?Diagnosis Date  ? Carpal tunnel syndrome, bilateral 05/18/2016  ? Diabetes (HCC)   ? Frequent headaches   ? Gingivitis   ? ? ?Family History  ?Problem Relation Age of Onset  ? Heart disease Father   ? Diabetes Maternal Grandmother   ? Diabetes Paternal Grandmother   ? ? ?Social History  ? ?Socioeconomic History  ? Marital status: Married  ?  Spouse name: Not on file  ? Number of children: 2  ? Years of education: 52  ? Highest education level: High school graduate  ?Occupational History  ? Occupation: Music therapist  ?  Comment: Self-employed  ?Tobacco Use  ? Smoking status: Never  ? Smokeless tobacco: Never  ?Vaping Use  ? Vaping Use: Never used  ?Substance and Sexual Activity  ? Alcohol use: Yes  ?  Comment: > 2 years ago had rare alcohol intake  ? Drug use: No  ? Sexual activity: Yes  ?  Partners: Female  ?  Comment: Married  ?Other Topics Concern  ? Not on file  ?Social History Narrative  ? Lives at home w/ his wife  ? Right-handed  ? Caffeine: three 16 oz drinks daily  ? ?Social Determinants of Health  ? ?Financial Resource Strain: Not on file  ?Food Insecurity: Not on file  ?Transportation Needs: Not on file  ?Physical Activity:  Not on file  ?Stress: Not on file  ?Social Connections: Not on file  ?Intimate Partner Violence: Not on file  ? ? ?Allergies  ?Allergen Reactions  ? Covid-19 (Mrna) Vaccine Proofreader) [Covid-19 (Mrna) Vaccine]   ?  Bruising under great toe on both feet 1 week s/p Moderna Immunization  ? ? ? ?Constitutional: Patient reports fatigue.  Denies headache, fever or abrupt weight changes.  ?HEENT:  Positive nasal congestion sore throat. Denies eye redness, eye pain, pressure behind the eyes, facial pain, ear pain, ringing in the ears, wax buildup, runny nose or bloody nose. ?Respiratory: Positive cough. Denies difficulty breathing or shortness of breath.  ?Cardiovascular: Denies chest pain, chest tightness, palpitations or swelling in the hands or feet.  ? ?No other specific complaints in a complete review of systems (except as listed in HPI above). ? ?Objective:  ? ?BP 106/72 (BP Location: Left Arm, Patient Position: Sitting, Cuff Size: Large)   Pulse 99   Temp (!) 96.9 ?F (36.1 ?C) (Temporal)   Wt 219 lb (99.3 kg)   SpO2 99%   BMI 29.70 kg/m?  ? ?Wt Readings from Last 3 Encounters:  ?04/15/21 217 lb (98.4 kg)  ?12/15/20 211 lb 12.8 oz (96.1 kg)  ?10/17/20 215 lb (97.5  kg)  ? ? ? ?General: Appears his stated age, overweight, in NAD. ?HEENT: Head: normal shape and size, no sinus tenderness noted; Eyes: sclera white, no icterus, conjunctiva pink; Throat/Mouth: + PND. Teeth present, mucosa erythematous and moist, no exudate noted, no lesions or ulcerations noted.  ?Neck: No cervical lymphadenopathy.  ?Cardiovascular: Normal rate and rhythm. S1,S2 noted.  No murmur, rubs or gallops noted.  ?Pulmonary/Chest: Normal effort and positive vesicular breath sounds. No respiratory distress. No wheezes, rales or ronchi noted.  ? ?    ?Assessment & Plan:  ? ?Sore Throat: ? ?Rapid strep test negative ?Likely viral ?80 mg Depo-Medrol IM x1, advised him to monitor his sugars as these will likely be increased ?Can take Ibuprofen 600 mg  every 8 hours as needed ?Salt water gargles may be helpful ? ?RTC in 4 months for follow-up of chronic conditions ? ? ?Nicki Reaper, NP ? ?

## 2021-06-11 NOTE — Assessment & Plan Note (Signed)
Advised him his sugars may be elevated over the next few days secondary to the steroid injection ?He has an upcoming appointment with endocrinology, will follow ?

## 2021-06-11 NOTE — Assessment & Plan Note (Signed)
Encourage diet and exercise for weight loss 

## 2021-06-11 NOTE — Patient Instructions (Signed)
Sore Throat When you have a sore throat, your throat may feel: Tender. Burning. Irritated. Scratchy. Painful when you swallow. Painful when you talk. Many things can cause a sore throat, such as: An infection. Allergies. Dry air. Smoke or pollution. Radiation treatment for cancer. Gastroesophageal reflux disease (GERD). A tumor. A sore throat can be the first sign of another sickness. It can happen with other problems, like: Coughing. Sneezing. Fever. Swelling of the glands in the neck. Most sore throats go away without treatment. Follow these instructions at home:     Medicines Take over-the-counter and prescription medicines only as told by your doctor. Children often get sore throats. Do not give your child aspirin. Use throat sprays to soothe your throat as told by your health care provider. Managing pain To help with pain: Sip warm liquids, such as broth, herbal tea, or warm water. Eat or drink cold or frozen liquids, such as frozen ice pops. Rinse your mouth (gargle) with a salt water mixture 3-4 times a day or as needed. To make salt water, dissolve -1 tsp (3-6 g) of salt in 1 cup (237 mL) of warm water. Do not swallow this mixture. Suck on hard candy or throat lozenges. Put a cool-mist humidifier in your bedroom at night. Sit in the bathroom with the door closed for 5-10 minutes while you run hot water in the shower. General instructions Do not smoke or use any products that contain nicotine or tobacco. If you need help quitting, ask your doctor. Get plenty of rest. Drink enough fluid to keep your pee (urine) pale yellow. Wash your hands often for at least 20 seconds with soap and water. If soap and water are not available, use hand sanitizer. Contact a doctor if: You have a fever for more than 2-3 days. You keep having symptoms for more than 2-3 days. Your throat does not get better in 7 days. You have a fever and your symptoms suddenly get worse. Your  child who is 3 months to 3 years old has a temperature of 102.2F (39C) or higher. Get help right away if: You have trouble breathing. You cannot swallow fluids, soft foods, or your spit. You have swelling in your throat or neck that gets worse. You feel like you may vomit (nauseous) and this feeling lasts a long time. You cannot stop vomiting. These symptoms may be an emergency. Get help right away. Call your local emergency services (911 in the [place] Do not wait to see if the symptoms will go away. Do not drive yourself to the hospital. Summary A sore throat is a painful, burning, irritated, or scratchy throat. Many things can cause a sore throat. Take over-the-counter medicines only as told by your doctor. Get plenty of rest. Drink enough fluid to keep your pee (urine) pale yellow. Contact a doctor if your symptoms get worse or your sore throat does not get better within 7 days. This information is not intended to replace advice given to you by your health care provider. Make sure you discuss any questions you have with your health care provider. Document Revised: 04/23/2020 Document Reviewed: 04/23/2020 Elsevier Patient Education  2023 Elsevier Inc.  

## 2021-06-11 NOTE — Assessment & Plan Note (Signed)
We will have him decrease his Atorvastatin to 3 times weekly again ?He will be due for repeat lipid at his initial endocrinology appointment, hopefully this will be done and I can follow-up on these labs ?Encouraged him to consume low-fat diet ?

## 2021-06-22 ENCOUNTER — Other Ambulatory Visit: Payer: Self-pay | Admitting: Internal Medicine

## 2021-06-23 NOTE — Telephone Encounter (Signed)
Requested Prescriptions  ?Pending Prescriptions Disp Refills  ?? glipiZIDE (GLUCOTROL) 10 MG tablet [Pharmacy Med Name: GLIPIZIDE 10 MG TABLET] 180 tablet 1  ?  Sig: TAKE 1 TABLET (10 MG TOTAL) BY MOUTH TWICE A DAY BEFORE A MEAL  ?  ? Endocrinology:  Diabetes - Sulfonylureas Failed - 06/22/2021  2:35 AM  ?  ?  Failed - HBA1C is between 0 and 7.9 and within 180 days  ?  Hemoglobin A1C  ?Date Value Ref Range Status  ?04/15/2021 8.6 (A) 4.0 - 5.6 % Final  ? ?Hgb A1c MFr Bld  ?Date Value Ref Range Status  ?12/15/2020 8.3 (H) <5.7 % of total Hgb Final  ?  Comment:  ?  For someone without known diabetes, a hemoglobin A1c ?value of 6.5% or greater indicates that they may have  ?diabetes and this should be confirmed with a follow-up  ?test. ?. ?For someone with known diabetes, a value <7% indicates  ?that their diabetes is well controlled and a value  ?greater than or equal to 7% indicates suboptimal  ?control. A1c targets should be individualized based on  ?duration of diabetes, age, comorbid conditions, and  ?other considerations. ?. ?Currently, no consensus exists regarding use of ?hemoglobin A1c for diagnosis of diabetes for children. ?. ?  ?   ?  ?  Passed - Cr in normal range and within 360 days  ?  Creat  ?Date Value Ref Range Status  ?04/15/2021 0.75 0.70 - 1.30 mg/dL Final  ? ?Creatinine, Urine  ?Date Value Ref Range Status  ?06/03/2020 39 20 - 320 mg/dL Final  ?   ?  ?  Passed - Valid encounter within last 6 months  ?  Recent Outpatient Visits   ?      ? 1 week ago Sore throat  ? [facility] [place], [place] W, NP  ? 2 months ago Type 2 diabetes mellitus with hyperglycemia, without long-term current use of insulin ([place])  ? [facility] [place], [place] W, NP  ? 6 months ago Need for immunization against influenza  ? [facility] [place], Coralie Keens, NP  ? 9 months ago Encounter for general adult medical examination with abnormal findings  ? [facility] [place],  [place] W, NP  ? 1 year ago Diabetic peripheral neuropathy associated with type 2 diabetes mellitus ([place])  ? [facility] [place], Coralie Keens, NP  ?  ?  ?Future Appointments   ?        ? In 3 months Baity, Coralie Keens, NP [facility], [place]  ?  ? ?  ?  ?  ? ? ?

## 2021-07-10 LAB — HM DIABETES EYE EXAM

## 2021-07-21 ENCOUNTER — Ambulatory Visit: Payer: Self-pay | Admitting: Internal Medicine

## 2021-07-27 ENCOUNTER — Other Ambulatory Visit: Payer: Self-pay | Admitting: Internal Medicine

## 2021-07-27 DIAGNOSIS — E1165 Type 2 diabetes mellitus with hyperglycemia: Secondary | ICD-10-CM

## 2021-07-27 NOTE — Telephone Encounter (Signed)
atorvastatin changed to 3 times per week- dc'd 06/11/21 Baity,Regina NP Requested Prescriptions  Refused Prescriptions Disp Refills  . atorvastatin (LIPITOR) 10 MG tablet [Pharmacy Med Name: ATORVASTATIN 10 MG TABLET] 30 tablet 2    Sig: TAKE 1 TABLET BY MOUTH EVERY DAY     Cardiovascular:  Antilipid - Statins Failed - 07/27/2021  2:08 AM      Failed - Lipid Panel in normal range within the last 12 months    Cholesterol  Date Value Ref Range Status  04/15/2021 217 (H) <200 mg/dL Final   LDL Cholesterol (Calc)  Date Value Ref Range Status  04/15/2021 137 (H) mg/dL (calc) Final    Comment:    Reference range: <100 . Desirable range <100 mg/dL for primary prevention;   <70 mg/dL for patients with CHD or diabetic patients  with > or = 2 CHD risk factors. Marland Kitchen LDL-C is now calculated using the Martin-Hopkins  calculation, which is a validated novel method providing  better accuracy than the Friedewald equation in the  estimation of LDL-C.  Horald Pollen et al. Lenox Ahr. 9509;326(71): 2061-2068  (http://education.Quest[employer].com/faq/FAQ164)    HDL  Date Value Ref Range Status  04/15/2021 58 > OR = 40 mg/dL Final   Triglycerides  Date Value Ref Range Status  04/15/2021 114 <150 mg/dL Final         Passed - Patient is not pregnant      Passed - Valid encounter within last 12 months    Recent Outpatient Visits          1 month ago Sore throat   [facility] [place], [place] W, NP   3 months ago Type 2 diabetes mellitus with hyperglycemia, without long-term current use of insulin [facility])   [facility], Salvadore Oxford, NP   7 months ago Need for immunization against influenza   [facility] [place], Salvadore Oxford, NP   10 months ago Encounter for general adult medical examination with abnormal findings   [facility] [place], Salvadore Oxford, NP   1 year ago Diabetic peripheral neuropathy associated with type 2 diabetes mellitus [facility])   [facility], Salvadore Oxford, NP      Future Appointments            In 2 months Baity, Salvadore Oxford, NP [facility], [facility]

## 2021-08-26 ENCOUNTER — Other Ambulatory Visit: Payer: Self-pay | Admitting: Internal Medicine

## 2021-08-26 DIAGNOSIS — E1165 Type 2 diabetes mellitus with hyperglycemia: Secondary | ICD-10-CM

## 2021-08-27 NOTE — Telephone Encounter (Signed)
Requested Prescriptions  Pending Prescriptions Disp Refills  . atorvastatin (LIPITOR) 10 MG tablet [Pharmacy Med Name: ATORVASTATIN 10 MG TABLET] 30 tablet 2    Sig: TAKE 1 TABLET BY MOUTH EVERY DAY     Cardiovascular:  Antilipid - Statins Failed - 08/26/2021  6:26 PM      Failed - Lipid Panel in normal range within the last 12 months    Cholesterol  Date Value Ref Range Status  04/15/2021 217 (H) <200 mg/dL Final   LDL Cholesterol (Calc)  Date Value Ref Range Status  04/15/2021 137 (H) mg/dL (calc) Final    Comment:    Reference range: <100 . Desirable range <100 mg/dL for primary prevention;   <70 mg/dL for patients with CHD or diabetic patients  with > or = 2 CHD risk factors. Marland Kitchen LDL-C is now calculated using the Martin-Hopkins  calculation, which is a validated novel method providing  better accuracy than the Friedewald equation in the  estimation of LDL-C.  Horald Pollen et al. Lenox Ahr. 0102;725(36): 2061-2068  (http://education.[facility].com/faq/FAQ164)    HDL  Date Value Ref Range Status  04/15/2021 58 > OR = 40 mg/dL Final   Triglycerides  Date Value Ref Range Status  04/15/2021 114 <150 mg/dL Final         Passed - Patient is not pregnant      Passed - Valid encounter within last 12 months    Recent Outpatient Visits          2 months ago Sore throat   [facility] [place], [place] W, NP   4 months ago Type 2 diabetes mellitus with hyperglycemia, without long-term current use of insulin [facility])   [facility], Salvadore Oxford, NP   8 months ago Need for immunization against influenza   [facility] [place], Salvadore Oxford, NP   11 months ago Encounter for general adult medical examination with abnormal findings   [facility] [place], Salvadore Oxford, NP   1 year ago Diabetic peripheral neuropathy associated with type 2 diabetes mellitus [facility])   [facility], Salvadore Oxford, NP      Future Appointments             In 1 month Baity, Salvadore Oxford, NP [facility], [facility]

## 2021-08-30 ENCOUNTER — Other Ambulatory Visit: Payer: Self-pay | Admitting: Internal Medicine

## 2021-09-01 ENCOUNTER — Other Ambulatory Visit: Payer: Self-pay | Admitting: Internal Medicine

## 2021-09-01 NOTE — Telephone Encounter (Signed)
Requested Prescriptions  Pending Prescriptions Disp Refills  . TRULICITY 1.5 MG/0.5ML SOPN [Pharmacy Med Name: TRULICITY 1.5 MG/0.5 ML PEN]  2    Sig: INJECT 1.5 MG INTO THE SKIN ONCE A WEEK.     Endocrinology:  Diabetes - GLP-1 Receptor Agonists Failed - 08/30/2021  9:45 AM      Failed - HBA1C is between 0 and 7.9 and within 180 days    Hemoglobin A1C  Date Value Ref Range Status  04/15/2021 8.6 (A) 4.0 - 5.6 % Final   Hgb A1c MFr Bld  Date Value Ref Range Status  12/15/2020 8.3 (H) <5.7 % of total Hgb Final    Comment:    For someone without known diabetes, a hemoglobin A1c value of 6.5% or greater indicates that they may have  diabetes and this should be confirmed with a follow-up  test. . For someone with known diabetes, a value <7% indicates  that their diabetes is well controlled and a value  greater than or equal to 7% indicates suboptimal  control. A1c targets should be individualized based on  duration of diabetes, age, comorbid conditions, and  other considerations. . Currently, no consensus exists regarding use of hemoglobin A1c for diagnosis of diabetes for children. Verna Czech - Valid encounter within last 6 months    Recent Outpatient Visits          2 months ago Sore throat   [facility] [place], [place] W, NP   4 months ago Type 2 diabetes mellitus with hyperglycemia, without long-term current use of insulin [facility])   [facility], Salvadore Oxford, NP   8 months ago Need for immunization against influenza   [facility] [place], Salvadore Oxford, NP   11 months ago Encounter for general adult medical examination with abnormal findings   [facility] [place], Salvadore Oxford, NP   1 year ago Diabetic peripheral neuropathy associated with type 2 diabetes mellitus [facility])   [facility], Salvadore Oxford, NP      Future Appointments            In 1 month Baity, Salvadore Oxford, NP [facility], [facility]

## 2021-09-02 NOTE — Telephone Encounter (Signed)
Requested Prescriptions  Pending Prescriptions Disp Refills  . JARDIANCE 25 MG TABS tablet [Pharmacy Med Name: JARDIANCE 25 MG TABLET] 90 tablet 0    Sig: TAKE 1 TABLET (25 MG TOTAL) BY MOUTH DAILY.     Endocrinology:  Diabetes - SGLT2 Inhibitors Failed - 09/01/2021  2:24 AM      Failed - HBA1C is between 0 and 7.9 and within 180 days    Hemoglobin A1C  Date Value Ref Range Status  04/15/2021 8.6 (A) 4.0 - 5.6 % Final   Hgb A1c MFr Bld  Date Value Ref Range Status  12/15/2020 8.3 (H) <5.7 % of total Hgb Final    Comment:    For someone without known diabetes, a hemoglobin A1c value of 6.5% or greater indicates that they may have  diabetes and this should be confirmed with a follow-up  test. . For someone with known diabetes, a value <7% indicates  that their diabetes is well controlled and a value  greater than or equal to 7% indicates suboptimal  control. A1c targets should be individualized based on  duration of diabetes, age, comorbid conditions, and  other considerations. . Currently, no consensus exists regarding use of hemoglobin A1c for diagnosis of diabetes for children. .          Passed - Cr in normal range and within 360 days    Creat  Date Value Ref Range Status  04/15/2021 0.75 0.70 - 1.30 mg/dL Final   Creatinine, Urine  Date Value Ref Range Status  06/03/2020 39 20 - 320 mg/dL Final         Passed - eGFR in normal range and within 360 days    GFR, Est African American  Date Value Ref Range Status  05/10/2019 138 > OR = 60 mL/min/1.51m Final   GFR, Est Non African American  Date Value Ref Range Status  05/10/2019 119 > OR = 60 mL/min/1.776mFinal   eGFR  Date Value Ref Range Status  04/15/2021 110 > OR = 60 mL/min/1.7368minal    Comment:    The eGFR is based on the CKD-EPI 2021 equation. To calculate  the new eGFR from a previous Creatinine or Cystatin C result, go to https://www.kidney.org/professionals/ kdoqi/gfr%5Fcalculator           Passed - Valid encounter within last 6 months    Recent Outpatient Visits          2 months ago Sore throat   [facility] [facility]   4 months ago Type 2 diabetes mellitus with hyperglycemia, without long-term current use of insulin [facility]   8 months ago Need for immunization against influenza   [facility]   11 months ago Encounter for general adult medical examination with abnormal findings   [facility]   1 year ago Diabetic peripheral neuropathy associated with type 2 diabetes mellitus ([facility]      Future Appointments            In 1 month Baity, RegCoralie KeensP [facility]

## 2021-09-26 ENCOUNTER — Other Ambulatory Visit: Payer: Self-pay | Admitting: Internal Medicine

## 2021-09-28 NOTE — Telephone Encounter (Signed)
Requested Prescriptions  Pending Prescriptions Disp Refills  . TRULICITY 0.75 MG/0.5ML SOPN [Pharmacy Med Name: TRULICITY 0.75 MG/0.5 ML PEN] 6 mL 0    Sig: INJECT 0.75 MG SUBCUTANEOUSLY ONE TIME PER WEEK     Endocrinology:  Diabetes - GLP-1 Receptor Agonists Failed - 09/26/2021  9:29 AM      Failed - HBA1C is between 0 and 7.9 and within 180 days    Hemoglobin A1C  Date Value Ref Range Status  04/15/2021 8.6 (A) 4.0 - 5.6 % Final   Hgb A1c MFr Bld  Date Value Ref Range Status  12/15/2020 8.3 (H) <5.7 % of total Hgb Final    Comment:    For someone without known diabetes, a hemoglobin A1c value of 6.5% or greater indicates that they may have  diabetes and this should be confirmed with a follow-up  test. . For someone with known diabetes, a value <7% indicates  that their diabetes is well controlled and a value  greater than or equal to 7% indicates suboptimal  control. A1c targets should be individualized based on  duration of diabetes, age, comorbid conditions, and  other considerations. . Currently, no consensus exists regarding use of hemoglobin A1c for diagnosis of diabetes for children. Verna Czech - Valid encounter within last 6 months    Recent Outpatient Visits          3 months ago Sore throat   [facility] [place], [place] W, NP   5 months ago Type 2 diabetes mellitus with hyperglycemia, without long-term current use of insulin [facility])   [facility], Salvadore Oxford, NP   9 months ago Need for immunization against influenza   [facility] [place], Salvadore Oxford, NP   1 year ago Encounter for general adult medical examination with abnormal findings   [facility] [place], Salvadore Oxford, NP   1 year ago Diabetic peripheral neuropathy associated with type 2 diabetes mellitus [facility])   [facility], Salvadore Oxford, NP      Future Appointments            In 2 weeks Sampson Si, Salvadore Oxford, NP [facility], [facility]

## 2021-10-15 ENCOUNTER — Ambulatory Visit (INDEPENDENT_AMBULATORY_CARE_PROVIDER_SITE_OTHER): Payer: 59 | Admitting: Internal Medicine

## 2021-10-15 ENCOUNTER — Encounter: Payer: Self-pay | Admitting: Internal Medicine

## 2021-10-15 VITALS — BP 130/86 | HR 100 | Temp 97.3°F | Ht 72.0 in | Wt 212.0 lb

## 2021-10-15 DIAGNOSIS — Z0001 Encounter for general adult medical examination with abnormal findings: Secondary | ICD-10-CM | POA: Diagnosis not present

## 2021-10-15 DIAGNOSIS — Z6828 Body mass index (BMI) 28.0-28.9, adult: Secondary | ICD-10-CM

## 2021-10-15 DIAGNOSIS — Z23 Encounter for immunization: Secondary | ICD-10-CM | POA: Diagnosis not present

## 2021-10-15 DIAGNOSIS — E1165 Type 2 diabetes mellitus with hyperglycemia: Secondary | ICD-10-CM

## 2021-10-15 DIAGNOSIS — E663 Overweight: Secondary | ICD-10-CM | POA: Diagnosis not present

## 2021-10-15 DIAGNOSIS — Z125 Encounter for screening for malignant neoplasm of prostate: Secondary | ICD-10-CM

## 2021-10-15 MED ORDER — FREESTYLE LIBRE 3 SENSOR MISC: 1.0000 | 1 refills | Status: AC

## 2021-10-15 NOTE — Patient Instructions (Signed)
Health Maintenance, Male Adopting a healthy lifestyle and getting preventive care are important in promoting health and wellness. Ask your health care provider about: The right schedule for you to have regular tests and exams. Things you can do on your own to prevent diseases and keep yourself healthy. What should I know about diet, weight, and exercise? Eat a healthy diet  Eat a diet that includes plenty of vegetables, fruits, low-fat dairy products, and lean protein. Do not eat a lot of foods that are high in solid fats, added sugars, or sodium. Maintain a healthy weight Body mass index (BMI) is a measurement that can be used to identify possible weight problems. It estimates body fat based on height and weight. Your health care provider can help determine your BMI and help you achieve or maintain a healthy weight. Get regular exercise Get regular exercise. This is one of the most important things you can do for your health. Most adults should: Exercise for at least 150 minutes each week. The exercise should increase your heart rate and make you sweat (moderate-intensity exercise). Do strengthening exercises at least twice a week. This is in addition to the moderate-intensity exercise. Spend less time sitting. Even light physical activity can be beneficial. Watch cholesterol and blood lipids Have your blood tested for lipids and cholesterol at 51 years of age, then have this test every 5 years. You may need to have your cholesterol levels checked more often if: Your lipid or cholesterol levels are high. You are older than 51 years of age. You are at high risk for heart disease. What should I know about cancer screening? Many types of cancers can be detected early and may often be prevented. Depending on your health history and family history, you may need to have cancer screening at various ages. This may include screening for: Colorectal cancer. Prostate cancer. Skin cancer. Lung  cancer. What should I know about heart disease, diabetes, and high blood pressure? Blood pressure and heart disease High blood pressure causes heart disease and increases the risk of stroke. This is more likely to develop in people who have high blood pressure readings or are overweight. Talk with your health care provider about your target blood pressure readings. Have your blood pressure checked: Every 3-5 years if you are 18-39 years of age. Every year if you are 40 years old or older. If you are between the ages of 65 and 75 and are a current or former smoker, ask your health care provider if you should have a one-time screening for abdominal aortic aneurysm (AAA). Diabetes Have regular diabetes screenings. This checks your fasting blood sugar level. Have the screening done: Once every three years after age 45 if you are at a normal weight and have a low risk for diabetes. More often and at a younger age if you are overweight or have a high risk for diabetes. What should I know about preventing infection? Hepatitis B If you have a higher risk for hepatitis B, you should be screened for this virus. Talk with your health care provider to find out if you are at risk for hepatitis B infection. Hepatitis C Blood testing is recommended for: Everyone born from 1945 through 1965. Anyone with known risk factors for hepatitis C. Sexually transmitted infections (STIs) You should be screened each year for STIs, including gonorrhea and chlamydia, if: You are sexually active and are younger than 51 years of age. You are older than 51 years of age and your   health care provider tells you that you are at risk for this type of infection. Your sexual activity has changed since you were last screened, and you are at increased risk for chlamydia or gonorrhea. Ask your health care provider if you are at risk. Ask your health care provider about whether you are at high risk for HIV. Your health care provider  may recommend a prescription medicine to help prevent HIV infection. If you choose to take medicine to prevent HIV, you should first get tested for HIV. You should then be tested every 3 months for as long as you are taking the medicine. Follow these instructions at home: Alcohol use Do not drink alcohol if your health care provider tells you not to drink. If you drink alcohol: Limit how much you have to 0-2 drinks a day. Know how much alcohol is in your drink. In the [place] one drink equals one 12 oz bottle of beer (355 mL), one 5 oz glass of wine (148 mL), or one 1 oz glass of hard liquor (44 mL). Lifestyle Do not use any products that contain nicotine or tobacco. These products include cigarettes, chewing tobacco, and vaping devices, such as e-cigarettes. If you need help quitting, ask your health care provider. Do not use street drugs. Do not share needles. Ask your health care provider for help if you need support or information about quitting drugs. General instructions Schedule regular health, dental, and eye exams. Stay current with your vaccines. Tell your health care provider if: You often feel depressed. You have ever been abused or do not feel safe at home. Summary Adopting a healthy lifestyle and getting preventive care are important in promoting health and wellness. Follow your health care provider's instructions about healthy diet, exercising, and getting tested or screened for diseases. Follow your health care provider's instructions on monitoring your cholesterol and blood pressure. This information is not intended to replace advice given to you by your health care provider. Make sure you discuss any questions you have with your health care provider. Document Revised: 06/16/2020 Document Reviewed: 06/16/2020 Elsevier Patient Education  2023 Elsevier Inc.  

## 2021-10-15 NOTE — Assessment & Plan Note (Signed)
Encourage diet and exercise for weight loss 

## 2021-10-15 NOTE — Progress Notes (Signed)
Subjective:    Patient ID: Bruce Brandt, male    DOB: [birth year], 51 y.o.   MRN: [id number]  HPI  Patient presents to clinic today for his annual exam.  Flu: 12/2020 Tetanus: 03/2018 COVID: [place] x2 Pneumovax: Never Shingrix: Never PSA screening: 01/2019 Colon screening: 10/2020 Vision screening: annually Dentist: biannually  Diet: He does eat meat. He consumes veggies, tries to limits fruits. He does eat some fried foods.He drinks mostly water, Powerade soda, tea. Exercise: Physical labor  Review of Systems  Past Medical History:  Diagnosis Date   Carpal tunnel syndrome, bilateral 05/18/2016   Diabetes (HCC)    Frequent headaches    Gingivitis     Current Outpatient Medications  Medication Sig Dispense Refill   aspirin EC 81 MG tablet Take 81 mg by mouth daily.     atorvastatin (LIPITOR) 10 MG tablet TAKE 1 TABLET BY MOUTH EVERY DAY 30 tablet 2   baclofen (LIORESAL) 10 MG tablet Take by mouth.     Blood Glucose Monitoring Suppl (ONE TOUCH ULTRA 2) w/Device KIT 1 Device by Does not apply route in the morning and at bedtime. 1 kit 0   Cholecalciferol (VITAMIN D-3) 125 MCG (5000 UT) TABS Take 2 capsules by mouth daily.     Continuous Blood Gluc Sensor (FREESTYLE LIBRE 14 DAY SENSOR) MISC USE AS DIRECTED TO CHECK BLOOD GLUCOSE. CHANGE EVERY 14 DAYS 6 each 1   glipiZIDE (GLUCOTROL) 10 MG tablet TAKE 1 TABLET (10 MG TOTAL) BY MOUTH TWICE A DAY BEFORE A MEAL 180 tablet 1   glucose blood (ONETOUCH ULTRA) test strip Check blood sugar up to three times a day 100 each 12   hydrocortisone 2.5 % lotion Apply topically 2 (two) times daily. 59 mL 0   imipramine (TOFRANIL) 25 MG tablet Take 100 mg by mouth daily.      JARDIANCE 25 MG TABS tablet TAKE 1 TABLET (25 MG TOTAL) BY MOUTH DAILY. 90 tablet 0   Magnesium 100 MG TABS Take by mouth.     multivitamin-iron-minerals-folic acid (CENTRUM) chewable tablet Chew 1 tablet by mouth daily.     Naproxen Sod-diphenhydrAMINE 220-25 MG TABS  Take 2 tablets by mouth at bedtime as needed.     TRULICITY 7.90 XY/3.3XO SOPN INJECT 0.75 MG SUBCUTANEOUSLY ONE TIME PER WEEK 6 mL 0   TRULICITY 1.5 VA/9.1BT SOPN INJECT 1.5 MG INTO THE SKIN ONCE A WEEK. 6 mL 1   Turmeric 400 MG CAPS Take by mouth.     zonisamide (ZONEGRAN) 25 MG capsule Take 100 mg by mouth daily.     zonisamide (ZONEGRAN) 50 MG capsule Take 200 mg by mouth daily.     No current facility-administered medications for this visit.    Allergies  Allergen Reactions   Covid-19 (Mrna) Vaccine Therapist, music) [Covid-19 (Mrna) Vaccine]     Bruising under great toe on both feet 1 week s/p Moderna Immunization    Family History  Problem Relation Age of Onset   Heart disease Father    Diabetes Maternal Grandmother    Diabetes Paternal Grandmother     Social History   Socioeconomic History   Marital status: Married    Spouse name: Not on file   Number of children: 2   Years of education: 12   Highest education level: High school graduate  Occupational History   Occupation: Games developer    Comment: Self-employed  Tobacco Use   Smoking status: Never   Smokeless tobacco: Never  Media planner  Vaping Use: Never used  Substance and Sexual Activity   Alcohol use: Yes    Comment: > 2 years ago had rare alcohol intake   Drug use: No   Sexual activity: Yes    Partners: Female    Comment: Married  Other Topics Concern   Not on file  Social History Narrative   Lives at home w/ his wife   Right-handed   Caffeine: three 16 oz drinks daily   Social Determinants of Health   Financial Resource Strain: Low Risk  (01/03/2018)   Overall Financial Resource Strain (CARDIA)    Difficulty of Paying Living Expenses: Not hard at all  Food Insecurity: No Food Insecurity (01/03/2018)   Hunger Vital Sign    Worried About Running Out of Food in the Last Year: Never true    [place] in the Last Year: Never true  Transportation Needs: No Transportation Needs (01/03/2018)   PRAPARE -  Hydrologist (Medical): No    Lack of Transportation (Non-Medical): No  Physical Activity: Sufficiently Active (01/03/2018)   Exercise Vital Sign    Days of Exercise per Week: 5 days    Minutes of Exercise per Session: 50 min  Stress: No Stress Concern Present (01/03/2018)   [place]    Feeling of Stress : Only a little  Social Connections: Socially Integrated (01/03/2018)   Social Connection and Isolation Panel [NHANES]    Frequency of Communication with Friends and Family: Twice a week    Frequency of Social Gatherings with Friends and Family: Twice a week    Attends Religious Services: More than 4 times per year    Active Member of [employer] or Organizations: Yes    Attends Archivist Meetings: More than 4 times per year    Marital Status: Married  Human resources officer Violence: Not At Risk (01/03/2018)   Humiliation, Afraid, Rape, and Kick questionnaire    Fear of Current or Ex-Partner: No    Emotionally Abused: No    Physically Abused: No    Sexually Abused: No     Constitutional: Patient reports intermittent headaches.  Denies fever, malaise, fatigue, or abrupt weight changes.  HEENT: Denies eye pain, eye redness, ear pain, ringing in the ears, wax buildup, runny nose, nasal congestion, bloody nose, or sore throat. Respiratory: Denies difficulty breathing, shortness of breath, cough or sputum production.   Cardiovascular: Denies chest pain, chest tightness, palpitations or swelling in the hands or feet.  Gastrointestinal: Denies abdominal pain, bloating, constipation, diarrhea or blood in the stool.  GU: Patient reports erectile dysfunction.  Denies urgency, frequency, pain with urination, burning sensation, blood in urine, odor or discharge. Musculoskeletal: Patient reports chronic shoulder pain.  Denies decrease in range of motion, difficulty with gait, muscle pain or joint  swelling.  Skin: Denies redness, rashes, lesions or ulcercations.  Neurological: Patient reports insomnia, intermittent numbness and tingling in feet.  Denies dizziness, difficulty with memory, difficulty with speech or problems with balance and coordination.  Psych: Denies anxiety, depression, SI/HI.  No other specific complaints in a complete review of systems (except as listed in HPI above).     Objective:   Physical Exam  BP 130/86 (BP Location: Left Arm, Patient Position: Sitting, Cuff Size: Normal)   Pulse 100   Temp (!) 97.3 F (36.3 C) (Temporal)   Ht 6' (1.829 m)   Wt 212 lb (96.2 kg)   SpO2 99%  BMI 28.75 kg/m   Wt Readings from Last 3 Encounters:  06/11/21 219 lb (99.3 kg)  04/15/21 217 lb (98.4 kg)  12/15/20 211 lb 12.8 oz (96.1 kg)    General: Appears his stated age, overweight, in NAD. Skin: Warm, dry and intact.  He has multiple bruises and excoriations noted to BLE but no ulcerations noted. HEENT: Head: normal shape and size; Eyes: sclera white, no icterus, conjunctiva pink, PERRLA and EOMs intact;  Neck:  Neck supple, trachea midline. No masses, lumps or thyromegaly present.  Cardiovascular: Normal rate and rhythm. S1,S2 noted.  No murmur, rubs or gallops noted. No JVD or BLE edema. No carotid bruits noted. Pulmonary/Chest: Normal effort and positive vesicular breath sounds. No respiratory distress. No wheezes, rales or ronchi noted.  Abdomen: Normal bowel sounds. Musculoskeletal: Strength 5/5 BUE/BLE.  No difficulty with gait.  Neurological: Alert and oriented. Cranial nerves II-XII grossly intact. Coordination normal.  Psychiatric: Mood and affect normal. Behavior is normal. Judgment and thought content normal.    BMET    Component Value Date/Time   NA 141 04/15/2021 0845   K 4.8 04/15/2021 0845   CL 108 04/15/2021 0845   CO2 23 04/15/2021 0845   GLUCOSE 188 (H) 04/15/2021 0845   BUN 15 04/15/2021 0845   CREATININE 0.75 04/15/2021 0845   CALCIUM  9.7 04/15/2021 0845   GFRNONAA 119 05/10/2019 0839   GFRAA 138 05/10/2019 0839    Lipid Panel     Component Value Date/Time   CHOL 217 (H) 04/15/2021 0845   TRIG 114 04/15/2021 0845   HDL 58 04/15/2021 0845   CHOLHDL 3.7 04/15/2021 0845   LDLCALC 137 (H) 04/15/2021 0845    CBC    Component Value Date/Time   WBC 7.2 09/09/2020 0820   RBC 5.68 09/09/2020 0820   HGB 16.5 09/09/2020 0820   HCT 50.8 (H) 09/09/2020 0820   PLT 232 09/09/2020 0820   MCV 89.4 09/09/2020 0820   MCH 29.0 09/09/2020 0820   MCHC 32.5 09/09/2020 0820   RDW 12.4 09/09/2020 0820   LYMPHSABS 1,489 05/10/2019 0839   MONOABS 0.4 11/17/2017 0957   EOSABS 58 05/10/2019 0839   BASOSABS 32 05/10/2019 0839    Hgb A1C Lab Results  Component Value Date   HGBA1C 8.6 (A) 04/15/2021           Assessment & Plan:   Preventative Health Maintenance:  Flu shot today Tetanus UTD Encouraged him to get his COVID booster He declines Pneumovax Discussed Shingrix vaccine, he will check coverage with his insurance company and schedule a nurse visit if he would like to get this done Colon screening UTD Encouraged him to consume a balanced diet and exercise regimen Advised him to see an eye doctor and dentist annually We will check CBC, c-Met, lipid, PSA, A1c and urine microalbumin today  RTC in 3 months, follow-up chronic conditions Webb Silversmith, NP

## 2021-10-16 ENCOUNTER — Ambulatory Visit: Payer: Self-pay

## 2021-10-16 LAB — HEMOGLOBIN A1C
Hgb A1c MFr Bld: 7.6 % of total Hgb — ABNORMAL HIGH (ref <5.7)
Mean Plasma Glucose: 171 mg/dL
eAG (mmol/L): 9.5 mmol/L

## 2021-10-16 LAB — CBC
HCT: 51.5 % — ABNORMAL HIGH (ref 38.5–50.0)
Hemoglobin: 16.7 g/dL (ref 13.2–17.1)
MCH: 29.1 pg (ref 27.0–33.0)
MCHC: 32.4 g/dL (ref 32.0–36.0)
MCV: 89.7 fL (ref 80.0–100.0)
MPV: 11 fL (ref 7.5–12.5)
Platelets: 219 10*3/uL (ref 140–400)
RBC: 5.74 10*6/uL (ref 4.20–5.80)
RDW: 12.4 % (ref 11.0–15.0)
WBC: 5.8 10*3/uL (ref 3.8–10.8)

## 2021-10-16 LAB — COMPLETE METABOLIC PANEL WITH GFR
AG Ratio: 2 (calc) (ref 1.0–2.5)
ALT: 18 U/L (ref 9–46)
AST: 12 U/L (ref 10–35)
Albumin: 4.8 g/dL (ref 3.6–5.1)
Alkaline phosphatase (APISO): 82 U/L (ref 35–144)
BUN: 23 mg/dL (ref 7–25)
CO2: 22 mmol/L (ref 20–32)
Calcium: 9.6 mg/dL (ref 8.6–10.3)
Chloride: 107 mmol/L (ref 98–110)
Creat: 0.73 mg/dL (ref 0.70–1.30)
Globulin: 2.4 g/dL (calc) (ref 1.9–3.7)
Glucose, Bld: 171 mg/dL — ABNORMAL HIGH (ref 65–99)
Potassium: 4.6 mmol/L (ref 3.5–5.3)
Sodium: 140 mmol/L (ref 135–146)
Total Bilirubin: 0.4 mg/dL (ref 0.2–1.2)
Total Protein: 7.2 g/dL (ref 6.1–8.1)
eGFR: 110 mL/min/{1.73_m2} (ref >=60)

## 2021-10-16 LAB — MICROALBUMIN / CREATININE URINE RATIO
Creatinine, Urine: 61 mg/dL (ref 20–320)
Microalb, Ur: <0.2 mg/dL

## 2021-10-16 LAB — LIPID PANEL
Cholesterol: 277 mg/dL — ABNORMAL HIGH (ref <200)
HDL: 58 mg/dL (ref >=40)
LDL Cholesterol (Calc): 187 mg/dL (calc) — ABNORMAL HIGH
Non-HDL Cholesterol (Calc): 219 mg/dL (calc) — ABNORMAL HIGH (ref <130)
Total CHOL/HDL Ratio: 4.8 (calc) (ref <5.0)
Triglycerides: 172 mg/dL — ABNORMAL HIGH (ref <150)

## 2021-10-16 LAB — PSA: PSA: 1.44 ng/mL (ref <=4.00)

## 2021-10-16 NOTE — Telephone Encounter (Signed)
  Chief Complaint: dose change Symptoms: NA Frequency: today Pertinent Negatives: NA Disposition: [] ED /[] Urgent Care (no appt availability in office) / [] Appointment(In office/virtual)/ []  [place] [facility]/ [] Home Care/ [] Refused Recommended Disposition /[] [place] [facility]/ [x]  Follow-up with PCP Additional Notes: pt called, LVM that would message back to provider that pt is ok with increase. Advised pt if he needed any further assistance from NT to CB to speak with a nurse.   Summary: Increase in dosage of Trulicity   Patient called in stating he wanted to increase his dosage on his TRULICITY 1.5 MG/0.5ML SOPN. He states the provider asked him this based on his recent lab results and he is responding with yes he would like to increase his dosage of the Trulicity. Please assist patient further      Reason for Disposition  [1] Caller has NON-URGENT medicine question about med that PCP prescribed AND [2] triager unable to answer question  Answer Assessment - Initial Assessment Questions 1. NAME of MEDICINE: "What medicine(s) are you calling about?"     TRULICITY 1.5 MG 2. QUESTION: "What is your question?" (e.g., double dose of medicine, side effect)     Pt is ok with increase in dosage 3. PRESCRIBER: "Who prescribed the medicine?" Reason: if prescribed by specialist, call should be referred to that group.     , NP  Protocols used: Medication Question Call-A-AH

## 2021-10-19 MED ORDER — TRULICITY 3 MG/0.5ML ~~LOC~~ SOAJ: 3.0000 mg | SUBCUTANEOUS | 0 refills | Status: DC

## 2021-10-19 NOTE — Addendum Note (Signed)
Addended by: Lorre Munroe on: 10/19/2021 12:22 PM   Modules accepted: Orders

## 2021-10-19 NOTE — Telephone Encounter (Signed)
RX for increased dose of Trulicity sent to pharmacy

## 2021-11-09 LAB — HM DIABETES EYE EXAM

## 2021-11-26 ENCOUNTER — Other Ambulatory Visit: Payer: Self-pay | Admitting: Internal Medicine

## 2021-11-26 NOTE — Telephone Encounter (Signed)
Requested Prescriptions  Pending Prescriptions Disp Refills  . JARDIANCE 25 MG TABS tablet [Pharmacy Med Name: JARDIANCE 25 MG TABLET] 30 tablet 2    Sig: TAKE 1 TABLET (25 MG TOTAL) BY MOUTH DAILY.     Endocrinology:  Diabetes - SGLT2 Inhibitors Passed - 11/26/2021  3:32 AM      Passed - Cr in normal range and within 360 days    Creat  Date Value Ref Range Status  10/15/2021 0.73 0.70 - 1.30 mg/dL Final   Creatinine, Urine  Date Value Ref Range Status  10/15/2021 61 20 - 320 mg/dL Final         Passed - HBA1C is between 0 and 7.9 and within 180 days    Hgb A1c MFr Bld  Date Value Ref Range Status  10/15/2021 7.6 (H) <5.7 % of total Hgb Final    Comment:    For someone without known diabetes, a hemoglobin A1c value of 6.5% or greater indicates that they may have  diabetes and this should be confirmed with a follow-up  test. . For someone with known diabetes, a value <7% indicates  that their diabetes is well controlled and a value  greater than or equal to 7% indicates suboptimal  control. A1c targets should be individualized based on  duration of diabetes, age, comorbid conditions, and  other considerations. . Currently, no consensus exists regarding use of hemoglobin A1c for diagnosis of diabetes for children. .          Passed - eGFR in normal range and within 360 days    GFR, Est African American  Date Value Ref Range Status  05/10/2019 138 > OR = 60 mL/min/1.73m2 Final   GFR, Est Non African American  Date Value Ref Range Status  05/10/2019 119 > OR = 60 mL/min/1.73m2 Final   eGFR  Date Value Ref Range Status  10/15/2021 110 > OR = 60 mL/min/1.73m2 Final         Passed - Valid encounter within last 6 months    Recent Outpatient Visits          1 month ago Encounter for general adult medical examination with abnormal findings   [facility] Baity, Regina W, NP   5 months ago Sore throat   [facility] Baity, Regina W, NP    7 months ago Type 2 diabetes mellitus with hyperglycemia, without long-term current use of insulin (HCC)   [facility] Baity, Regina W, NP   11 months ago Need for immunization against influenza   [facility] Baity, Regina W, NP   1 year ago Encounter for general adult medical examination with abnormal findings   [facility] Baity, Regina W, NP      Future Appointments            In 1 month Baity, Regina W, NP [facility]             

## 2021-12-26 ENCOUNTER — Other Ambulatory Visit: Payer: Self-pay | Admitting: Internal Medicine

## 2021-12-28 NOTE — Telephone Encounter (Signed)
Requested Prescriptions  Pending Prescriptions Disp Refills   glipiZIDE (GLUCOTROL) 10 MG tablet [Pharmacy Med Name: GLIPIZIDE 10 MG TABLET] 180 tablet 0    Sig: TAKE 1 TABLET (10 MG TOTAL) BY MOUTH TWICE A DAY BEFORE A MEAL     Endocrinology:  Diabetes - Sulfonylureas Passed - 12/26/2021  9:21 AM      Passed - HBA1C is between 0 and 7.9 and within 180 days    Hgb A1c MFr Bld  Date Value Ref Range Status  10/15/2021 7.6 (H) <5.7 % of total Hgb Final    Comment:    For someone without known diabetes, a hemoglobin A1c value of 6.5% or greater indicates that they may have  diabetes and this should be confirmed with a follow-up  test. . For someone with known diabetes, a value <7% indicates  that their diabetes is well controlled and a value  greater than or equal to 7% indicates suboptimal  control. A1c targets should be individualized based on  duration of diabetes, age, comorbid conditions, and  other considerations. . Currently, no consensus exists regarding use of hemoglobin A1c for diagnosis of diabetes for children. .          Passed - Cr in normal range and within 360 days    Creat  Date Value Ref Range Status  10/15/2021 0.73 0.70 - 1.30 mg/dL Final   Creatinine, Urine  Date Value Ref Range Status  10/15/2021 61 20 - 320 mg/dL Final         Passed - Valid encounter within last 6 months    Recent Outpatient Visits           2 months ago Encounter for general adult medical examination with abnormal findings   [facility] [place], Salvadore Oxford, NP   6 months ago Sore throat   [facility] [place], [place] W, NP   8 months ago Type 2 diabetes mellitus with hyperglycemia, without long-term current use of insulin [facility])   [facility], Salvadore Oxford, NP   1 year ago Need for immunization against influenza   [facility] [place], Salvadore Oxford, NP   1 year ago Encounter for general adult medical examination with abnormal  findings   [facility] [place], Salvadore Oxford, NP       Future Appointments             In 3 weeks Sampson Si, Salvadore Oxford, NP [facility]

## 2022-01-11 ENCOUNTER — Other Ambulatory Visit: Payer: Self-pay | Admitting: Internal Medicine

## 2022-01-11 DIAGNOSIS — E1165 Type 2 diabetes mellitus with hyperglycemia: Secondary | ICD-10-CM

## 2022-01-11 NOTE — Telephone Encounter (Signed)
Requested Prescriptions  Pending Prescriptions Disp Refills   atorvastatin (LIPITOR) 10 MG tablet [Pharmacy Med Name: ATORVASTATIN 10 MG TABLET] 90 tablet 0    Sig: TAKE 1 TABLET BY MOUTH EVERY DAY     Cardiovascular:  Antilipid - Statins Failed - 01/11/2022  1:51 AM      Failed - Lipid Panel in normal range within the last 12 months    Cholesterol  Date Value Ref Range Status  10/15/2021 277 (H) <200 mg/dL Final   LDL Cholesterol (Calc)  Date Value Ref Range Status  10/15/2021 187 (H) mg/dL (calc) Final    Comment:    Reference range: <100 . Desirable range <100 mg/dL for primary prevention;   <70 mg/dL for patients with CHD or diabetic patients  with > or = 2 CHD risk factors. Marland Kitchen LDL-C is now calculated using the Martin-Hopkins  calculation, which is a validated novel method providing  better accuracy than the Friedewald equation in the  estimation of LDL-C.  Horald Pollen et al. Lenox Ahr. 9629;528(41): 2061-2068  (http://education.[facility].com/faq/FAQ164)    HDL  Date Value Ref Range Status  10/15/2021 58 > OR = 40 mg/dL Final   Triglycerides  Date Value Ref Range Status  10/15/2021 172 (H) <150 mg/dL Final         Passed - Patient is not pregnant      Passed - Valid encounter within last 12 months    Recent Outpatient Visits           2 months ago Encounter for general adult medical examination with abnormal findings   [facility] [place], Salvadore Oxford, NP   7 months ago Sore throat   [facility] [place], [place] W, NP   9 months ago Type 2 diabetes mellitus with hyperglycemia, without long-term current use of insulin [facility])   [facility], Salvadore Oxford, NP   1 year ago Need for immunization against influenza   [facility] [place], Salvadore Oxford, NP   1 year ago Encounter for general adult medical examination with abnormal findings   [facility] [place], Salvadore Oxford, NP       Future Appointments              In 1 week Sampson Si, Salvadore Oxford, NP [facility]

## 2022-01-22 ENCOUNTER — Ambulatory Visit (INDEPENDENT_AMBULATORY_CARE_PROVIDER_SITE_OTHER): Payer: 59 | Admitting: Internal Medicine

## 2022-01-22 ENCOUNTER — Encounter: Payer: Self-pay | Admitting: Internal Medicine

## 2022-01-22 VITALS — BP 130/84 | HR 102 | Ht 72.0 in | Wt 220.0 lb

## 2022-01-22 DIAGNOSIS — E1165 Type 2 diabetes mellitus with hyperglycemia: Secondary | ICD-10-CM | POA: Diagnosis not present

## 2022-01-22 DIAGNOSIS — E663 Overweight: Secondary | ICD-10-CM

## 2022-01-22 DIAGNOSIS — N5201 Erectile dysfunction due to arterial insufficiency: Secondary | ICD-10-CM

## 2022-01-22 DIAGNOSIS — Z6829 Body mass index (BMI) 29.0-29.9, adult: Secondary | ICD-10-CM

## 2022-01-22 DIAGNOSIS — M25511 Pain in right shoulder: Secondary | ICD-10-CM | POA: Diagnosis not present

## 2022-01-22 DIAGNOSIS — R519 Headache, unspecified: Secondary | ICD-10-CM

## 2022-01-22 DIAGNOSIS — G8929 Other chronic pain: Secondary | ICD-10-CM

## 2022-01-22 DIAGNOSIS — E785 Hyperlipidemia, unspecified: Secondary | ICD-10-CM

## 2022-01-22 DIAGNOSIS — E1169 Type 2 diabetes mellitus with other specified complication: Secondary | ICD-10-CM | POA: Diagnosis not present

## 2022-01-22 MED ORDER — TRULICITY 3 MG/0.5ML ~~LOC~~ SOAJ: 3.0000 mg | SUBCUTANEOUS | 0 refills | Status: DC

## 2022-01-22 MED ORDER — INSUPEN PEN NEEDLES 32G X 4 MM MISC: 3 refills | Status: DC

## 2022-01-22 NOTE — Assessment & Plan Note (Signed)
Currently not taking any medications for this He does not follow with urology

## 2022-01-22 NOTE — Assessment & Plan Note (Signed)
C-Met and lipid profile today Encouraged him to consume low-fat diet Continue atorvastatin 

## 2022-01-22 NOTE — Progress Notes (Signed)
Subjective:    Patient ID: Bruce Brandt, male    DOB: [birth year], 51 y.o.   MRN: [id number]  HPI  Patient presents to clinic today for 59-monthfollow-up of chronic conditions.  Frequent Headaches: These occur daily.  He is not sure what triggers this.  He takes Imipramine, Baclofen and Zonegran as prescribed with some relief of symptoms.  He follows with neurology.  HLD: His last LDL was 187, triglycerides 172, 9/02023.  He denies myalgias on Atorvastatin (takes 3-4 times per week).  He does not consume a low-fat diet.  DM2: His last A1c was 7.6%, 10/2021.  He is taking [place] Trulicity, Glipizide and [place] prescribed.  His sugars range 81-250.  He checks his feet routinely.  His last eye exam was 11/2021.  Flu 10/2021.  Pneumovax never.  CLe Sueurx 2.  He follows with endocrinology.  Chronic Shoulder Pain: Managed with Naproxen as needed. He does not follow with orthopedics.  ED: He is not taking any medications for this. He does not follow with urology.  Review of Systems     Past Medical History:  Diagnosis Date   Carpal tunnel syndrome, bilateral 05/18/2016   Diabetes (HCC)    Frequent headaches    Gingivitis     Current Outpatient Medications  Medication Sig Dispense Refill   aspirin EC 81 MG tablet Take 81 mg by mouth daily.     atorvastatin (LIPITOR) 10 MG tablet TAKE 1 TABLET BY MOUTH EVERY DAY 90 tablet 0   baclofen (LIORESAL) 10 MG tablet Take by mouth.     Blood Glucose Monitoring Suppl (ONE TOUCH ULTRA 2) w/Device KIT 1 Device by Does not apply route in the morning and at bedtime. 1 kit 0   Cholecalciferol (VITAMIN D-3) 125 MCG (5000 UT) TABS Take 2 capsules by mouth daily.     Continuous Blood Gluc Sensor (FREESTYLE LIBRE 3 SENSOR) MISC 1 Device by Does not apply route every 14 (fourteen) days. Place 1 sensor on the skin every 14 days. Use to check glucose continuously 6 each 1   Dulaglutide (TRULICITY) 3 MEH/2.1YYSOPN Inject 3 mg as directed once a  week. 6 mL 0   glipiZIDE (GLUCOTROL) 10 MG tablet TAKE 1 TABLET (10 MG TOTAL) BY MOUTH TWICE A DAY BEFORE A MEAL 180 tablet 0   glucose blood (ONETOUCH ULTRA) test strip Check blood sugar up to three times a day 100 each 12   hydrocortisone 2.5 % lotion Apply topically 2 (two) times daily. 59 mL 0   imipramine (TOFRANIL) 25 MG tablet Take 100 mg by mouth daily.      JARDIANCE 25 MG TABS tablet TAKE 1 TABLET (25 MG TOTAL) BY MOUTH DAILY. 30 tablet 2   Magnesium 100 MG TABS Take by mouth.     multivitamin-iron-minerals-folic acid (CENTRUM) chewable tablet Chew 1 tablet by mouth daily.     Naproxen Sod-diphenhydrAMINE 220-25 MG TABS Take 2 tablets by mouth at bedtime as needed.     Turmeric 400 MG CAPS Take by mouth.     zonisamide (ZONEGRAN) 25 MG capsule Take 100 mg by mouth daily.     zonisamide (ZONEGRAN) 50 MG capsule Take 200 mg by mouth daily.     No current facility-administered medications for this visit.    Allergies  Allergen Reactions   Covid-19 (Mrna) Vaccine (Therapist, music [Covid-19 (Mrna) Vaccine]     Bruising under great toe on both feet 1 week s/p [employer]   Family  History  Problem Relation Age of Onset   Heart disease Father    Diabetes Maternal Grandmother    Diabetes Paternal Grandmother     Social History   Socioeconomic History   Marital status: Married    Spouse name: Not on file   Number of children: 2   Years of education: 12   Highest education level: High school graduate  Occupational History   Occupation: Games developer    Comment: Self-employed  Tobacco Use   Smoking status: Never   Smokeless tobacco: Never  Vaping Use   Vaping Use: Never used  Substance and Sexual Activity   Alcohol use: Yes    Comment: > 2 years ago had rare alcohol intake   Drug use: No   Sexual activity: Yes    Partners: Female    Comment: Married  Other Topics Concern   Not on file  Social History Narrative   Lives at home w/ his wife   Right-handed   Caffeine:  three 16 oz drinks daily   Social Determinants of Health   Financial Resource Strain: Low Risk  (01/03/2018)   Overall Financial Resource Strain (CARDIA)    Difficulty of Paying Living Expenses: Not hard at all  Food Insecurity: No Food Insecurity (01/03/2018)   Hunger Vital Sign    Worried About Running Out of Food in the Last Year: Never true    [place] in the Last Year: Never true  Transportation Needs: No Transportation Needs (01/03/2018)   PRAPARE - Hydrologist (Medical): No    Lack of Transportation (Non-Medical): No  Physical Activity: Sufficiently Active (01/03/2018)   Exercise Vital Sign    Days of Exercise per Week: 5 days    Minutes of Exercise per Session: 50 min  Stress: No Stress Concern Present (01/03/2018)   [place]    Feeling of Stress : Only a little  Social Connections: Socially Integrated (01/03/2018)   Social Connection and Isolation Panel [NHANES]    Frequency of Communication with Friends and Family: Twice a week    Frequency of Social Gatherings with Friends and Family: Twice a week    Attends Religious Services: More than 4 times per year    Active Member of [employer] or Organizations: Yes    Attends Music therapist: More than 4 times per year    Marital Status: Married  Human resources officer Violence: Not At Risk (01/03/2018)   Humiliation, Afraid, Rape, and Kick questionnaire    Fear of Current or Ex-Partner: No    Emotionally Abused: No    Physically Abused: No    Sexually Abused: No     Constitutional: Denies fever, malaise, fatigue, headache or abrupt weight changes.  HEENT: Denies eye pain, eye redness, ear pain, ringing in the ears, wax buildup, runny nose, nasal congestion, bloody nose, or sore throat. Respiratory: Denies difficulty breathing, shortness of breath, cough or sputum production.   Cardiovascular: Denies chest pain, chest  tightness, palpitations or swelling in the hands or feet.  Gastrointestinal: Pt reports frequent belching. Denies abdominal pain, bloating, constipation, diarrhea or blood in the stool.  GU: Pt reports erectile dysfunction. Denies urgency, frequency, pain with urination, burning sensation, blood in urine, odor or discharge. Musculoskeletal: Pt reports chronic right shoulder pain. Denies decrease in range of motion, difficulty with gait, muscle pain or joint pain and swelling.  Skin: Denies redness, rashes, lesions or ulcercations.  Neurological: Denies  dizziness, difficulty with memory, difficulty with speech or problems with balance and coordination.  Psych: Denies anxiety, depression, SI/HI.  No other specific complaints in a complete review of systems (except as listed in HPI above).  Objective:   Physical Exam   BP 130/84   Pulse (!) 102   Ht 6' (1.829 m)   Wt 220 lb (99.8 kg)   SpO2 100%   BMI 29.84 kg/m   Wt Readings from Last 3 Encounters:  10/15/21 212 lb (96.2 kg)  06/11/21 219 lb (99.3 kg)  04/15/21 217 lb (98.4 kg)    General: Appears his stated age, overweight in NAD. Skin: Warm, dry and intact. No ulcerations noted. HEENT: Head: normal shape and size; Eyes: sclera white, no icterus, conjunctiva pink, PERRLA and EOMs intact; Cardiovascular: Tachycardic with normal rhythm. S1,S2 noted.  No murmur, rubs or gallops noted. No JVD or BLE edema. No carotid bruits noted. Pulmonary/Chest: Normal effort and positive vesicular breath sounds. No respiratory distress. No wheezes, rales or ronchi noted.  Abdomen: Normal bowel sounds. Musculoskeletal: No difficulty with gait.  Neurological: Alert and oriented. Coordination normal.     BMET    Component Value Date/Time   NA 140 10/15/2021 0819   K 4.6 10/15/2021 0819   CL 107 10/15/2021 0819   CO2 22 10/15/2021 0819   GLUCOSE 171 (H) 10/15/2021 0819   BUN 23 10/15/2021 0819   CREATININE 0.73 10/15/2021 0819   CALCIUM 9.6  10/15/2021 0819   GFRNONAA 119 05/10/2019 0839   GFRAA 138 05/10/2019 0839    Lipid Panel     Component Value Date/Time   CHOL 277 (H) 10/15/2021 0819   TRIG 172 (H) 10/15/2021 0819   HDL 58 10/15/2021 0819   CHOLHDL 4.8 10/15/2021 0819   LDLCALC 187 (H) 10/15/2021 0819    CBC    Component Value Date/Time   WBC 5.8 10/15/2021 0819   RBC 5.74 10/15/2021 0819   HGB 16.7 10/15/2021 0819   HCT 51.5 (H) 10/15/2021 0819   PLT 219 10/15/2021 0819   MCV 89.7 10/15/2021 0819   MCH 29.1 10/15/2021 0819   MCHC 32.4 10/15/2021 0819   RDW 12.4 10/15/2021 0819   LYMPHSABS 1,489 05/10/2019 0839   MONOABS 0.4 11/17/2017 0957   EOSABS 58 05/10/2019 0839   BASOSABS 32 05/10/2019 0839    Hgb A1C Lab Results  Component Value Date   HGBA1C 7.6 (H) 10/15/2021           Assessment & Plan:     RTC in 6 months follow up chronic conditions Webb Silversmith, NP

## 2022-01-22 NOTE — Assessment & Plan Note (Signed)
Encouraged shoulder exercises Continue naproxen as needed

## 2022-01-22 NOTE — Assessment & Plan Note (Signed)
Encourage diet and exercise for weight loss 

## 2022-01-22 NOTE — Patient Instructions (Signed)

## 2022-01-22 NOTE — Assessment & Plan Note (Signed)
Continue Zonegran, baclofen and imipramine per neurology

## 2022-01-22 NOTE — Assessment & Plan Note (Signed)
A1c and urine microalbumin today Encouraged him to consume a low-carb diet and exercise for weight loss Continue Tresiba, Trulicity, glipizide and Jardiance Encouraged routine eye exam Encouraged routine foot exam Flu shot UTD He declines Pneumovax Encouraged COVID booster

## 2022-01-23 LAB — MICROALBUMIN / CREATININE URINE RATIO
Creatinine, Urine: 54 mg/dL (ref 20–320)
Microalb Creat Ratio: 4 mcg/mg creat (ref <30)
Microalb, Ur: 0.2 mg/dL

## 2022-01-23 LAB — COMPLETE METABOLIC PANEL WITH GFR
AG Ratio: 1.8 (calc) (ref 1.0–2.5)
ALT: 22 U/L (ref 9–46)
AST: 18 U/L (ref 10–35)
Albumin: 4.8 g/dL (ref 3.6–5.1)
Alkaline phosphatase (APISO): 75 U/L (ref 35–144)
BUN: 22 mg/dL (ref 7–25)
CO2: 22 mmol/L (ref 20–32)
Calcium: 9.6 mg/dL (ref 8.6–10.3)
Chloride: 110 mmol/L (ref 98–110)
Creat: 0.78 mg/dL (ref 0.70–1.30)
Globulin: 2.6 g/dL (calc) (ref 1.9–3.7)
Glucose, Bld: 166 mg/dL — ABNORMAL HIGH (ref 65–99)
Potassium: 5.5 mmol/L — ABNORMAL HIGH (ref 3.5–5.3)
Sodium: 139 mmol/L (ref 135–146)
Total Bilirubin: 0.6 mg/dL (ref 0.2–1.2)
Total Protein: 7.4 g/dL (ref 6.1–8.1)
eGFR: 108 mL/min/{1.73_m2} (ref >=60)

## 2022-01-23 LAB — HEMOGLOBIN A1C
Hgb A1c MFr Bld: 7.4 %{Hb} — ABNORMAL HIGH
Mean Plasma Glucose: 166 mg/dL
eAG (mmol/L): 9.2 mmol/L

## 2022-01-23 LAB — LIPID PANEL
Cholesterol: 211 mg/dL — ABNORMAL HIGH (ref <200)
HDL: 53 mg/dL (ref >=40)
LDL Cholesterol (Calc): 130 mg/dL (calc) — ABNORMAL HIGH
Non-HDL Cholesterol (Calc): 158 mg/dL (calc) — ABNORMAL HIGH (ref <130)
Total CHOL/HDL Ratio: 4 (calc) (ref <5.0)
Triglycerides: 160 mg/dL — ABNORMAL HIGH (ref <150)

## 2022-02-22 ENCOUNTER — Other Ambulatory Visit: Payer: Self-pay | Admitting: Internal Medicine

## 2022-02-22 NOTE — Telephone Encounter (Signed)
Requested Prescriptions  Pending Prescriptions Disp Refills   JARDIANCE 25 MG TABS tablet [Pharmacy Med Name: JARDIANCE 25 MG TABLET] 30 tablet 2    Sig: TAKE 1 TABLET (25 MG TOTAL) BY MOUTH DAILY.     Endocrinology:  Diabetes - SGLT2 Inhibitors Passed - 02/22/2022  1:35 AM      Passed - Cr in normal range and within 360 days    Creat  Date Value Ref Range Status  01/22/2022 0.78 0.70 - 1.30 mg/dL Final   Creatinine, Urine  Date Value Ref Range Status  01/22/2022 54 20 - 320 mg/dL Final         Passed - HBA1C is between 0 and 7.9 and within 180 days    Hgb A1c MFr Bld  Date Value Ref Range Status  01/22/2022 7.4 (H) <5.7 % of total Hgb Final    Comment:    For someone without known diabetes, a hemoglobin A1c value of 6.5% or greater indicates that they may have  diabetes and this should be confirmed with a follow-up  test. . For someone with known diabetes, a value <7% indicates  that their diabetes is well controlled and a value  greater than or equal to 7% indicates suboptimal  control. A1c targets should be individualized based on  duration of diabetes, age, comorbid conditions, and  other considerations. . Currently, no consensus exists regarding use of hemoglobin A1c for diagnosis of diabetes for children. .          Passed - eGFR in normal range and within 360 days    GFR, Est African American  Date Value Ref Range Status  05/10/2019 138 > OR = 60 mL/min/1.2m2 Final   GFR, Est Non African American  Date Value Ref Range Status  05/10/2019 119 > OR = 60 mL/min/1.16m2 Final   eGFR  Date Value Ref Range Status  01/22/2022 108 > OR = 60 mL/min/1.38m2 Final         Passed - Valid encounter within last 6 months    Recent Outpatient Visits           1 month ago Type 2 diabetes mellitus with hyperglycemia, without long-term current use of insulin [facility])   [facility], Coralie Keens, NP   4 months ago Encounter for general adult medical  examination with abnormal findings   [facility] [place], Coralie Keens, NP   8 months ago Sore throat   [facility] [place], [place] W, NP   10 months ago Type 2 diabetes mellitus with hyperglycemia, without long-term current use of insulin [facility])   [facility] [place], Coralie Keens, NP   1 year ago Need for immunization against influenza   [facility] [place], Coralie Keens, NP

## 2022-02-23 ENCOUNTER — Encounter: Payer: Self-pay | Admitting: Internal Medicine

## 2022-02-23 ENCOUNTER — Other Ambulatory Visit: Payer: Self-pay | Admitting: Internal Medicine

## 2022-02-23 NOTE — Telephone Encounter (Signed)
Please call patient.  His insurance will not cover Trulicity or Jardiance.  He will need to let me know what alternatives are covered so that I can send these in.

## 2022-02-23 NOTE — Telephone Encounter (Signed)
Requested medication (s) are due for refill today: Alternative Requested:PA REQUIRED.   Requested medication (s) are on the active medication list: yes  Last refill:  02/22/22  Future visit scheduled: yes  Notes to clinic: Alternative Requested:PA REQUIRED.       Requested Prescriptions  Pending Prescriptions Disp Refills   JARDIANCE 25 MG TABS tablet [Pharmacy Med Name: JARDIANCE 25 MG TABLET] 30 tablet 2    Sig: TAKE 1 TABLET (25 MG TOTAL) BY MOUTH DAILY.     Endocrinology:  Diabetes - SGLT2 Inhibitors Passed - 02/23/2022 10:25 AM      Passed - Cr in normal range and within 360 days    Creat  Date Value Ref Range Status  01/22/2022 0.78 0.70 - 1.30 mg/dL Final   Creatinine, Urine  Date Value Ref Range Status  01/22/2022 54 20 - 320 mg/dL Final         Passed - HBA1C is between 0 and 7.9 and within 180 days    Hgb A1c MFr Bld  Date Value Ref Range Status  01/22/2022 7.4 (H) <5.7 % of total Hgb Final    Comment:    For someone without known diabetes, a hemoglobin A1c value of 6.5% or greater indicates that they may have  diabetes and this should be confirmed with a follow-up  test. . For someone with known diabetes, a value <7% indicates  that their diabetes is well controlled and a value  greater than or equal to 7% indicates suboptimal  control. A1c targets should be individualized based on  duration of diabetes, age, comorbid conditions, and  other considerations. . Currently, no consensus exists regarding use of hemoglobin A1c for diagnosis of diabetes for children. .          Passed - eGFR in normal range and within 360 days    GFR, Est African American  Date Value Ref Range Status  05/10/2019 138 > OR = 60 mL/min/1.85m2 Final   GFR, Est Non African American  Date Value Ref Range Status  05/10/2019 119 > OR = 60 mL/min/1.53m2 Final   eGFR  Date Value Ref Range Status  01/22/2022 108 > OR = 60 mL/min/1.35m2 Final         Passed - Valid encounter  within last 6 months    Recent Outpatient Visits           1 month ago Type 2 diabetes mellitus with hyperglycemia, without long-term current use of insulin ([place])   [facility], Coralie Keens, NP   4 months ago Encounter for general adult medical examination with abnormal findings   [facility] [place], Coralie Keens, NP   8 months ago Sore throat   [facility] [place], [place] W, NP   10 months ago Type 2 diabetes mellitus with hyperglycemia, without long-term current use of insulin [facility])   [facility] [place], Coralie Keens, NP   1 year ago Need for immunization against influenza   [place], Coralie Keens, NP               TRULICITY 3 ZO/1.0RU SOPN [Pharmacy Med Name: TRULICITY 3 EA/5.4 ML PEN]  0    Sig: Inject 3 mg as directed once a week.     Endocrinology:  Diabetes - GLP-1 Receptor Agonists Passed - 02/23/2022 10:25 AM      Passed - HBA1C is between 0 and 7.9 and within 180 days  Hgb A1c MFr Bld  Date Value Ref Range Status  01/22/2022 7.4 (H) <5.7 % of total Hgb Final    Comment:    For someone without known diabetes, a hemoglobin A1c value of 6.5% or greater indicates that they may have  diabetes and this should be confirmed with a follow-up  test. . For someone with known diabetes, a value <7% indicates  that their diabetes is well controlled and a value  greater than or equal to 7% indicates suboptimal  control. A1c targets should be individualized based on  duration of diabetes, age, comorbid conditions, and  other considerations. . Currently, no consensus exists regarding use of hemoglobin A1c for diagnosis of diabetes for children. Renella Cunas - Valid encounter within last 6 months    Recent Outpatient Visits           1 month ago Type 2 diabetes mellitus with hyperglycemia, without long-term current use of insulin [facility])   [facility] [place], Coralie Keens, NP   4  months ago Encounter for general adult medical examination with abnormal findings   [facility] [place], Coralie Keens, NP   8 months ago Sore throat   [facility] [place], [place], NP   10 months ago Type 2 diabetes mellitus with hyperglycemia, without long-term current use of insulin [facility])   [facility], Coralie Keens, NP   1 year ago Need for immunization against influenza   [facility] [place], Coralie Keens, NP

## 2022-02-23 NOTE — Telephone Encounter (Signed)
Pharmacy comment on both meds. Trulicity:alternative requested Jardiance:alternative requested- step therapy required.    Requested Prescriptions  Pending Prescriptions Disp Refills   TRULICITY 3 NF/6.2ZH SOPN [Pharmacy Med Name: TRULICITY 3 YQ/6.5 ML PEN]  0    Sig: INJECT 3 MG AS DIRECTED ONCE A WEEK.     Endocrinology:  Diabetes - GLP-1 Receptor Agonists Passed - 02/22/2022  3:34 PM      Passed - HBA1C is between 0 and 7.9 and within 180 days    Hgb A1c MFr Bld  Date Value Ref Range Status  01/22/2022 7.4 (H) <5.7 % of total Hgb Final    Comment:    For someone without known diabetes, a hemoglobin A1c value of 6.5% or greater indicates that they may have  diabetes and this should be confirmed with a follow-up  test. . For someone with known diabetes, a value <7% indicates  that their diabetes is well controlled and a value  greater than or equal to 7% indicates suboptimal  control. A1c targets should be individualized based on  duration of diabetes, age, comorbid conditions, and  other considerations. . Currently, no consensus exists regarding use of hemoglobin A1c for diagnosis of diabetes for children. Renella Cunas - Valid encounter within last 6 months    Recent Outpatient Visits           1 month ago Type 2 diabetes mellitus with hyperglycemia, without long-term current use of insulin ([place])   [facility] [place], Coralie Keens, NP   4 months ago Encounter for general adult medical examination with abnormal findings   [facility] [place], Coralie Keens, NP   8 months ago Sore throat   [facility] [place], [place] W, NP   10 months ago Type 2 diabetes mellitus with hyperglycemia, without long-term current use of insulin [facility])   [facility], Coralie Keens, NP   1 year ago Need for immunization against influenza   [facility] [place], Coralie Keens, NP               JARDIANCE 25 MG TABS tablet  [Pharmacy Med Name: JARDIANCE 25 MG TABLET] 30 tablet 2    Sig: TAKE 1 TABLET (25 MG TOTAL) BY MOUTH DAILY.     Endocrinology:  Diabetes - SGLT2 Inhibitors Passed - 02/22/2022  3:34 PM      Passed - Cr in normal range and within 360 days    Creat  Date Value Ref Range Status  01/22/2022 0.78 0.70 - 1.30 mg/dL Final   Creatinine, Urine  Date Value Ref Range Status  01/22/2022 54 20 - 320 mg/dL Final         Passed - HBA1C is between 0 and 7.9 and within 180 days    Hgb A1c MFr Bld  Date Value Ref Range Status  01/22/2022 7.4 (H) <5.7 % of total Hgb Final    Comment:    For someone without known diabetes, a hemoglobin A1c value of 6.5% or greater indicates that they may have  diabetes and this should be confirmed with a follow-up  test. . For someone with known diabetes, a value <7% indicates  that their diabetes is well controlled and a value  greater than or equal to 7% indicates suboptimal  control. A1c targets should be individualized based on  duration of diabetes, age, comorbid conditions, and  other considerations. . Currently, no  consensus exists regarding use of hemoglobin A1c for diagnosis of diabetes for children. .          Passed - eGFR in normal range and within 360 days    GFR, Est African American  Date Value Ref Range Status  05/10/2019 138 > OR = 60 mL/min/1.70m2 Final   GFR, Est Non African American  Date Value Ref Range Status  05/10/2019 119 > OR = 60 mL/min/1.75m2 Final   eGFR  Date Value Ref Range Status  01/22/2022 108 > OR = 60 mL/min/1.107m2 Final         Passed - Valid encounter within last 6 months    Recent Outpatient Visits           1 month ago Type 2 diabetes mellitus with hyperglycemia, without long-term current use of insulin [facility])   [facility], Coralie Keens, NP   4 months ago Encounter for general adult medical examination with abnormal findings   [facility] [place], Coralie Keens, NP   8 months  ago Sore throat   [facility] [place], [place] W, NP   10 months ago Type 2 diabetes mellitus with hyperglycemia, without long-term current use of insulin [facility])   [facility] [place], Coralie Keens, NP   1 year ago Need for immunization against influenza   [facility] [place], Coralie Keens, NP

## 2022-02-23 NOTE — Telephone Encounter (Signed)
LMTCB 02/23/2022.  PEC please advise when he calls back.   Thanks,   -Mickel Baas

## 2022-02-24 ENCOUNTER — Encounter
Admission: EM | Disposition: A | Discharge status: Home / Self Care | Payer: Self-pay | Source: Home / Self Care | Attending: Emergency Medicine

## 2022-02-24 ENCOUNTER — Observation Stay: Payer: Managed Care, Other (non HMO) | Admitting: Anesthesiology

## 2022-02-24 ENCOUNTER — Other Ambulatory Visit: Payer: Self-pay | Admitting: Surgery

## 2022-02-24 ENCOUNTER — Emergency Department: Payer: Managed Care, Other (non HMO)

## 2022-02-24 ENCOUNTER — Observation Stay
Admission: EM | Admit: 2022-02-24 | Discharge: 2022-02-25 | Disposition: A | Discharge status: Home / Self Care | Payer: Managed Care, Other (non HMO) | Attending: Surgery | Admitting: Surgery

## 2022-02-24 ENCOUNTER — Ambulatory Visit: Payer: Self-pay | Admitting: *Deleted

## 2022-02-24 ENCOUNTER — Other Ambulatory Visit: Payer: Self-pay

## 2022-02-24 DIAGNOSIS — E119 Type 2 diabetes mellitus without complications: Secondary | ICD-10-CM | POA: Insufficient documentation

## 2022-02-24 DIAGNOSIS — K353 Acute appendicitis with localized peritonitis, without perforation or gangrene: Secondary | ICD-10-CM

## 2022-02-24 DIAGNOSIS — K3580 Unspecified acute appendicitis: Secondary | ICD-10-CM | POA: Diagnosis not present

## 2022-02-24 DIAGNOSIS — Z79899 Other long term (current) drug therapy: Secondary | ICD-10-CM | POA: Diagnosis not present

## 2022-02-24 DIAGNOSIS — K3532 Acute appendicitis with perforation and localized peritonitis, without abscess: Secondary | ICD-10-CM | POA: Diagnosis not present

## 2022-02-24 DIAGNOSIS — R1031 Right lower quadrant pain: Secondary | ICD-10-CM | POA: Diagnosis present

## 2022-02-24 DIAGNOSIS — Z794 Long term (current) use of insulin: Secondary | ICD-10-CM | POA: Insufficient documentation

## 2022-02-24 DIAGNOSIS — Z7982 Long term (current) use of aspirin: Secondary | ICD-10-CM | POA: Insufficient documentation

## 2022-02-24 DIAGNOSIS — Z7984 Long term (current) use of oral hypoglycemic drugs: Secondary | ICD-10-CM | POA: Insufficient documentation

## 2022-02-24 HISTORY — PX: LAPAROSCOPIC APPENDECTOMY: SHX408

## 2022-02-24 LAB — URINALYSIS, ROUTINE W REFLEX MICROSCOPIC
Bacteria, UA: NONE SEEN
Bilirubin Urine: NEGATIVE
Glucose, UA: >=500 mg/dL — AB
Hgb urine dipstick: NEGATIVE
Ketones, ur: 20 mg/dL — AB
Leukocytes,Ua: NEGATIVE
Nitrite: NEGATIVE
Protein, ur: NEGATIVE mg/dL
Specific Gravity, Urine: 1.04 — ABNORMAL HIGH (ref 1.005–1.030)
Squamous Epithelial / HPF: NONE SEEN /HPF (ref 0–5)
pH: 5 (ref 5.0–8.0)

## 2022-02-24 LAB — GLUCOSE, CAPILLARY
Glucose-Capillary: 152 mg/dL — ABNORMAL HIGH (ref 70–99)
Glucose-Capillary: 172 mg/dL — ABNORMAL HIGH (ref 70–99)
Glucose-Capillary: 209 mg/dL — ABNORMAL HIGH (ref 70–99)

## 2022-02-24 LAB — CBC
HCT: 50.1 % (ref 39.0–52.0)
Hemoglobin: 16 g/dL (ref 13.0–17.0)
MCH: 28.1 pg (ref 26.0–34.0)
MCHC: 31.9 g/dL (ref 30.0–36.0)
MCV: 88 fL (ref 80.0–100.0)
Platelets: 240 10*3/uL (ref 150–400)
RBC: 5.69 MIL/uL (ref 4.22–5.81)
RDW: 13.2 % (ref 11.5–15.5)
WBC: 16.3 10*3/uL — ABNORMAL HIGH (ref 4.0–10.5)
nRBC: 0 % (ref 0.0–0.2)

## 2022-02-24 LAB — COMPREHENSIVE METABOLIC PANEL
ALT: 36 U/L (ref 0–44)
AST: 21 U/L (ref 15–41)
Albumin: 4.5 g/dL (ref 3.5–5.0)
Alkaline Phosphatase: 77 U/L (ref 38–126)
Anion gap: 10 (ref 5–15)
BUN: 12 mg/dL (ref 6–20)
CO2: 20 mmol/L — ABNORMAL LOW (ref 22–32)
Calcium: 8.8 mg/dL — ABNORMAL LOW (ref 8.9–10.3)
Chloride: 109 mmol/L (ref 98–111)
Creatinine, Ser: 0.88 mg/dL (ref 0.61–1.24)
GFR, Estimated: >60 mL/min (ref >60)
Glucose, Bld: 211 mg/dL — ABNORMAL HIGH (ref 70–99)
Potassium: 4.2 mmol/L (ref 3.5–5.1)
Sodium: 139 mmol/L (ref 135–145)
Total Bilirubin: 0.7 mg/dL (ref 0.3–1.2)
Total Protein: 7.8 g/dL (ref 6.5–8.1)

## 2022-02-24 LAB — LIPASE, BLOOD: Lipase: 36 U/L (ref 11–51)

## 2022-02-24 SURGERY — APPENDECTOMY, LAPAROSCOPIC
Anesthesia: General

## 2022-02-24 MED ORDER — PIPERACILLIN-TAZOBACTAM 3.375 G IVPB
3.3750 g | Freq: Three times a day (TID) | INTRAVENOUS | Status: DC
  Administered 2022-02-24 – 2022-02-25 (×2): 3.375 g via INTRAVENOUS
  Filled 2022-02-24 (×2): qty 50

## 2022-02-24 MED ORDER — ACETAMINOPHEN 10 MG/ML IV SOLN
INTRAVENOUS | Status: DC | PRN
  Administered 2022-02-24: 1000 mg via INTRAVENOUS

## 2022-02-24 MED ORDER — DEXAMETHASONE SODIUM PHOSPHATE 10 MG/ML IJ SOLN
INTRAMUSCULAR | Status: AC
  Filled 2022-02-24: qty 1

## 2022-02-24 MED ORDER — OXYCODONE HCL 5 MG PO TABS
ORAL_TABLET | ORAL | Status: AC
  Filled 2022-02-24: qty 1

## 2022-02-24 MED ORDER — ACETAMINOPHEN 500 MG PO TABS
1000.0000 mg | ORAL_TABLET | Freq: Four times a day (QID) | ORAL | Status: DC
  Administered 2022-02-25 (×2): 1000 mg via ORAL
  Filled 2022-02-24 (×2): qty 2

## 2022-02-24 MED ORDER — FENTANYL CITRATE (PF) 100 MCG/2ML IJ SOLN
INTRAMUSCULAR | Status: AC
  Filled 2022-02-24: qty 2

## 2022-02-24 MED ORDER — ONDANSETRON HCL 4 MG/2ML IJ SOLN: 4.0000 mg | Freq: Once | INTRAMUSCULAR | Status: DC | PRN

## 2022-02-24 MED ORDER — SUGAMMADEX SODIUM 200 MG/2ML IV SOLN
INTRAVENOUS | Status: DC | PRN
  Administered 2022-02-24: 200 mg via INTRAVENOUS

## 2022-02-24 MED ORDER — ONDANSETRON HCL 4 MG/2ML IJ SOLN
4.0000 mg | Freq: Once | INTRAMUSCULAR | Status: AC
  Administered 2022-02-24: 4 mg via INTRAVENOUS
  Filled 2022-02-24: qty 2

## 2022-02-24 MED ORDER — PIPERACILLIN-TAZOBACTAM 3.375 G IVPB 30 MIN
3.3750 g | Freq: Once | INTRAVENOUS | Status: AC
  Administered 2022-02-24: 3.375 g via INTRAVENOUS
  Filled 2022-02-24: qty 50

## 2022-02-24 MED ORDER — FENTANYL CITRATE PF 50 MCG/ML IJ SOSY
PREFILLED_SYRINGE | INTRAMUSCULAR | Status: AC
  Filled 2022-02-24: qty 1

## 2022-02-24 MED ORDER — LIDOCAINE HCL (PF) 2 % IJ SOLN
INTRAMUSCULAR | Status: AC
  Filled 2022-02-24: qty 5

## 2022-02-24 MED ORDER — ONDANSETRON HCL 4 MG/2ML IJ SOLN: 4.0000 mg | Freq: Four times a day (QID) | INTRAMUSCULAR | Status: DC | PRN

## 2022-02-24 MED ORDER — HYDROMORPHONE HCL 1 MG/ML IJ SOLN
0.5000 mg | INTRAMUSCULAR | Status: DC | PRN
  Administered 2022-02-24: 1 mg via INTRAVENOUS
  Filled 2022-02-24: qty 1

## 2022-02-24 MED ORDER — ACETAMINOPHEN 10 MG/ML IV SOLN: 1000.0000 mg | Freq: Once | INTRAVENOUS | Status: DC | PRN

## 2022-02-24 MED ORDER — LACTATED RINGERS IV BOLUS
1000.0000 mL | Freq: Once | INTRAVENOUS | Status: AC
  Administered 2022-02-24: 1000 mL via INTRAVENOUS

## 2022-02-24 MED ORDER — IOHEXOL 300 MG/ML  SOLN
100.0000 mL | Freq: Once | INTRAMUSCULAR | Status: AC | PRN
  Administered 2022-02-24: 100 mL via INTRAVENOUS

## 2022-02-24 MED ORDER — SODIUM CHLORIDE 0.9 % IV SOLN: INTRAVENOUS | Status: DC

## 2022-02-24 MED ORDER — OXYCODONE HCL 5 MG PO TABS
5.0000 mg | ORAL_TABLET | Freq: Once | ORAL | Status: AC | PRN
  Administered 2022-02-24: 5 mg via ORAL

## 2022-02-24 MED ORDER — INSULIN ASPART 100 UNIT/ML IJ SOLN
0.0000 [IU] | INTRAMUSCULAR | Status: DC
  Administered 2022-02-24: 3 [IU] via SUBCUTANEOUS
  Administered 2022-02-25: 2 [IU] via SUBCUTANEOUS
  Administered 2022-02-25: 3 [IU] via SUBCUTANEOUS
  Filled 2022-02-24 (×3): qty 1

## 2022-02-24 MED ORDER — FENTANYL CITRATE (PF) 100 MCG/2ML IJ SOLN
INTRAMUSCULAR | Status: DC | PRN
  Administered 2022-02-24 (×2): 50 ug via INTRAVENOUS

## 2022-02-24 MED ORDER — OXYCODONE HCL 5 MG/5ML PO SOLN: 5.0000 mg | Freq: Once | ORAL | Status: AC | PRN

## 2022-02-24 MED ORDER — OXYCODONE HCL 5 MG PO TABS: 5.0000 mg | ORAL_TABLET | ORAL | Status: DC | PRN

## 2022-02-24 MED ORDER — PHENYLEPHRINE 80 MCG/ML (10ML) SYRINGE FOR IV PUSH (FOR BLOOD PRESSURE SUPPORT)
PREFILLED_SYRINGE | INTRAVENOUS | Status: DC | PRN
  Administered 2022-02-24: 80 ug via INTRAVENOUS

## 2022-02-24 MED ORDER — LACTATED RINGERS IV SOLN: INTRAVENOUS | Status: DC

## 2022-02-24 MED ORDER — SODIUM CHLORIDE 0.9 % IR SOLN
Status: DC | PRN
  Administered 2022-02-24: 3000 mL

## 2022-02-24 MED ORDER — ONDANSETRON HCL 4 MG/2ML IJ SOLN
INTRAMUSCULAR | Status: DC | PRN
  Administered 2022-02-24: 4 mg via INTRAVENOUS

## 2022-02-24 MED ORDER — ACETAMINOPHEN 10 MG/ML IV SOLN
INTRAVENOUS | Status: AC
  Filled 2022-02-24: qty 100

## 2022-02-24 MED ORDER — MORPHINE SULFATE (PF) 4 MG/ML IV SOLN
4.0000 mg | Freq: Once | INTRAVENOUS | Status: AC
  Administered 2022-02-24: 4 mg via INTRAVENOUS
  Filled 2022-02-24: qty 1

## 2022-02-24 MED ORDER — ROCURONIUM BROMIDE 10 MG/ML (PF) SYRINGE
PREFILLED_SYRINGE | INTRAVENOUS | Status: AC
  Filled 2022-02-24: qty 10

## 2022-02-24 MED ORDER — 0.9 % SODIUM CHLORIDE (POUR BTL) OPTIME
TOPICAL | Status: DC | PRN
  Administered 2022-02-24: 150 mL

## 2022-02-24 MED ORDER — EPINEPHRINE PF 1 MG/ML IJ SOLN
INTRAMUSCULAR | Status: AC
  Filled 2022-02-24: qty 1

## 2022-02-24 MED ORDER — BUPIVACAINE-EPINEPHRINE 0.25% -1:200000 IJ SOLN
INTRAMUSCULAR | Status: DC | PRN
  Administered 2022-02-24: 22 mL

## 2022-02-24 MED ORDER — SUCCINYLCHOLINE CHLORIDE 200 MG/10ML IV SOSY
PREFILLED_SYRINGE | INTRAVENOUS | Status: DC | PRN
  Administered 2022-02-24: 100 mg via INTRAVENOUS

## 2022-02-24 MED ORDER — FENTANYL CITRATE (PF) 100 MCG/2ML IJ SOLN: 25.0000 ug | INTRAMUSCULAR | Status: DC | PRN

## 2022-02-24 MED ORDER — PROPOFOL 10 MG/ML IV BOLUS
INTRAVENOUS | Status: DC | PRN
  Administered 2022-02-24: 200 mg via INTRAVENOUS

## 2022-02-24 MED ORDER — LIDOCAINE HCL (CARDIAC) PF 100 MG/5ML IV SOSY
PREFILLED_SYRINGE | INTRAVENOUS | Status: DC | PRN
  Administered 2022-02-24: 100 mg via INTRAVENOUS

## 2022-02-24 MED ORDER — ROCURONIUM BROMIDE 100 MG/10ML IV SOLN
INTRAVENOUS | Status: DC | PRN
  Administered 2022-02-24: 45 mg via INTRAVENOUS
  Administered 2022-02-24 (×2): 5 mg via INTRAVENOUS

## 2022-02-24 MED ORDER — FENTANYL CITRATE PF 50 MCG/ML IJ SOSY
50.0000 ug | PREFILLED_SYRINGE | Freq: Once | INTRAMUSCULAR | Status: AC
  Administered 2022-02-24: 50 ug via INTRAVENOUS

## 2022-02-24 MED ORDER — IMIPRAMINE HCL 50 MG PO TABS
100.0000 mg | ORAL_TABLET | Freq: Every day | ORAL | Status: DC
  Administered 2022-02-24: 100 mg via ORAL
  Filled 2022-02-24 (×3): qty 2

## 2022-02-24 MED ORDER — DEXAMETHASONE SODIUM PHOSPHATE 10 MG/ML IJ SOLN
INTRAMUSCULAR | Status: DC | PRN
  Administered 2022-02-24: 5 mg via INTRAVENOUS

## 2022-02-24 MED ORDER — ONDANSETRON 4 MG PO TBDP: 4.0000 mg | ORAL_TABLET | Freq: Four times a day (QID) | ORAL | Status: DC | PRN

## 2022-02-24 MED ORDER — MIDAZOLAM HCL 2 MG/2ML IJ SOLN
INTRAMUSCULAR | Status: DC | PRN
  Administered 2022-02-24: 2 mg via INTRAVENOUS

## 2022-02-24 MED ORDER — AMOXICILLIN-POT CLAVULANATE 875-125 MG PO TABS: 1.0000 | ORAL_TABLET | Freq: Two times a day (BID) | ORAL | 0 refills | Status: DC

## 2022-02-24 MED ORDER — PROPOFOL 10 MG/ML IV BOLUS
INTRAVENOUS | Status: AC
  Filled 2022-02-24: qty 20

## 2022-02-24 MED ORDER — LACTATED RINGERS IV SOLN: INTRAVENOUS | Status: DC | PRN

## 2022-02-24 MED ORDER — MIDAZOLAM HCL 2 MG/2ML IJ SOLN
INTRAMUSCULAR | Status: AC
  Filled 2022-02-24: qty 2

## 2022-02-24 MED ORDER — ONDANSETRON HCL 4 MG/2ML IJ SOLN
INTRAMUSCULAR | Status: AC
  Filled 2022-02-24: qty 2

## 2022-02-24 MED ORDER — OXYCODONE-ACETAMINOPHEN 5-325 MG PO TABS: 1.0000 | ORAL_TABLET | ORAL | 0 refills | Status: DC | PRN

## 2022-02-24 SURGICAL SUPPLY — 31 items
BLADE CLIPPER SURG (BLADE) ×1 IMPLANT
CUTTER FLEX LINEAR 45M (STAPLE) IMPLANT
DERMABOND ADVANCED .7 DNX12 (GAUZE/BANDAGES/DRESSINGS) ×1 IMPLANT
GLOVE ORTHO TXT STRL SZ7.5 (GLOVE) ×1 IMPLANT
GOWN STRL REUS W/ TWL LRG LVL3 (GOWN DISPOSABLE) ×1 IMPLANT
GOWN STRL REUS W/ TWL XL LVL3 (GOWN DISPOSABLE) ×1 IMPLANT
GOWN STRL REUS W/TWL LRG LVL3 (GOWN DISPOSABLE) ×2
GOWN STRL REUS W/TWL XL LVL3 (GOWN DISPOSABLE) ×1
IRRIGATION STRYKERFLOW (MISCELLANEOUS) IMPLANT
IRRIGATOR STRYKERFLOW (MISCELLANEOUS) ×1
IV NS IRRIG 3000ML ARTHROMATIC (IV SOLUTION) IMPLANT
KIT TURNOVER KIT A (KITS) ×1 IMPLANT
MANIFOLD NEPTUNE II (INSTRUMENTS) ×1 IMPLANT
NDL INSUFFLATION 14GA 120MM (NEEDLE) IMPLANT
NEEDLE HYPO 22GX1.5 SAFETY (NEEDLE) ×1 IMPLANT
NEEDLE INSUFFLATION 14GA 120MM (NEEDLE) ×1 IMPLANT
NS IRRIG 500ML POUR BTL (IV SOLUTION) ×1 IMPLANT
PACK LAP CHOLECYSTECTOMY (MISCELLANEOUS) ×1 IMPLANT
RELOAD 45 VASCULAR/THIN (ENDOMECHANICALS) ×1 IMPLANT
RELOAD STAPLE 45 2.5 WHT GRN (ENDOMECHANICALS) IMPLANT
SET TUBE SMOKE EVAC HIGH FLOW (TUBING) ×1 IMPLANT
SHEARS HARMONIC ACE PLUS 36CM (ENDOMECHANICALS) ×1 IMPLANT
SUT MNCRL 4-0 (SUTURE) ×1
SUT MNCRL 4-0 27XMFL (SUTURE) ×1
SUT VICRYL 0 UR6 27IN ABS (SUTURE) IMPLANT
SUTURE MNCRL 4-0 27XMF (SUTURE) ×1 IMPLANT
SYS BAG RETRIEVAL 10MM (BASKET) ×1
SYSTEM BAG RETRIEVAL 10MM (BASKET) ×1 IMPLANT
TROCAR Z THRD FIOS 12X150 (TROCAR) IMPLANT
TROCAR Z-THREAD OPTICAL 5X100M (TROCAR) ×1 IMPLANT
WATER STERILE IRR 500ML POUR (IV SOLUTION) ×1 IMPLANT

## 2022-02-24 NOTE — Op Note (Signed)
Laparascopic appendectomy   Bruce Brandt Date of operation:  02/24/2022  Indications: The patient presented with a history of  abdominal pain. Workup has revealed findings consistent with acute appendicitis.  Pre-operative Diagnosis: Acute appendicitis without mention of peritonitis  Post-operative Diagnosis: Early perforated appendicitis.   Surgeon: [name], M.D., FACS  Anesthesia: General with endotracheal tube  Findings: Focal gangrene with perf.   Estimated Blood Loss: 15 mL         Specimens: appendix         Complications:  none  Procedure Details  The patient was seen again in the preop area. The options of surgery versus observation were reviewed with the patient and/or family. The risks of bleeding, infection, recurrence of symptoms, negative laparoscopy, potential for an open procedure, bowel injury, abscess or infection, were all reviewed as well. The patient was taken to Operating Room, identified as Bruce Brandt and the procedure verified as laparoscopic appendectomy. A Time Out was held and the above information confirmed. The patient was placed in the supine position and general anesthesia was induced.  Antibiotic prophylaxis was administered pre-op and VT E prophylaxis was in place.  After local infiltration of quarter percent Marcaine with epinephrine, stab incision was made left upper quadrant.  Just below the costal margin approximately midclavicular line the Veress needle is passed with sensation of the layers to penetrate the abdominal wall and into the peritoneum.  Saline drop test is confirmed peritoneal placement.  Insufflation is initiated with carbon dioxide to pressures of 15 mmHg.  The abdomen was prepped and draped in a sterile fashion. Local infiltration with 0.25% Marcaine with epi is administered to all incisions.  An infraumbilical incision was made.  A towel grasper is applied for countertraction, and a 5 mm optical trocar was passed into the  peritoneal cavity under direct visualization.  Pneumoperitoneum obtained. One 12 mm port in the LLQ, and another 5 mm port were placed under direct visualization.  The appendix was identified.  The appendix was carefully dissected. The mesoappendix was divided with Harmonic scalpel. The base of the appendix was dissected out and divided with a 45 mm white load Endo GIA.The appendix was placed in a Endo Catch bag and removed via the 12 mm LLQ port. The right lower quadrant and pelvis was then irrigated with 3L of normal saline which was aspirated. Inspection  failed to identify any additional bleeding and there were no signs of bowel injury. Again the right lower quadrant was inspected there was no sign of bleeding or bowel injury.  The LLQ fascia was closed with 0 Vicryl  using the suture passer, pneumoperitoneum was released, all ports were removed, and the skin incisions were approximated with subcuticular 4-0 Monocryl. Dermabond was applied.  The patient tolerated the procedure well, there were no complications. The sponge lap and needle count were correct at the end of the procedure.  The patient was taken to the recovery room in stable condition to be discharged to home, probably in the morning, when appropriate.   [name], M.D., [facility] 02/24/2022 - 5:31 PM

## 2022-02-24 NOTE — ED Notes (Signed)
Pt to CT

## 2022-02-24 NOTE — ED Notes (Signed)
Pt appears uncomfortable. R sided abdominal pain down to groin since yesterday. Labs and urine already sent.

## 2022-02-24 NOTE — ED Triage Notes (Signed)
Pt come with c/o right sided belly pain that started yesterday. Pt states pain goes down to his groin.

## 2022-02-24 NOTE — Telephone Encounter (Signed)
  Chief Complaint: Abdominal Pain Symptoms: 10/10 constant pain, mid lower "Near belly button" radiates to right groin area, chills.  Sounds distressed. Frequency: Onset last night Pertinent Negatives: Patient denies N/V/D Disposition: [x] ED /[] Urgent Care (no appt availability in office) / [] Appointment(In office/virtual)/ []  [facility]/ [] Home Care/ [] Refused Recommended Disposition /[] [place] [facility]/ []  Follow-up with PCP Additional Notes: Pt directed to ED, care advise provided, verbalizes understanding. States will follow disposition, has driver. Reason for Disposition  [1] SEVERE pain (e.g., excruciating) AND [2] present > 1 hour  Answer Assessment - Initial Assessment Questions 1. LOCATION: "Where does it hurt?"      Mid lower 2. RADIATION: "Does the pain shoot anywhere else?" (e.g., chest, back)     Groin right side 3. ONSET: "When did the pain begin?" (Minutes, hours or days ago)      Last night 4. SUDDEN: "Gradual or sudden onset?"     Suddenly 5. PATTERN "Does the pain come and go, or is it constant?"    - If it comes and goes: "How long does it last?" "Do you have pain now?"     (Note: Comes and goes means the pain is intermittent. It goes away completely between bouts.)    - If constant: "Is it getting better, staying the same, or getting worse?"      (Note: Constant means the pain never goes away completely; most serious pain is constant and gets worse.)      Constant 6. SEVERITY: "How bad is the pain?"  (e.g., Scale 1-10; mild, moderate, or severe)    - MILD (1-3): Doesn't interfere with normal activities, abdomen soft and not tender to touch.     - MODERATE (4-7): Interferes with normal activities or awakens from sleep, abdomen tender to touch.     - SEVERE (8-10): Excruciating pain, doubled over, unable to do any normal activities.       10/10 7. RECURRENT SYMPTOM: "Have you ever had this type of stomach pain before?" If Yes, ask: "When was the last  time?" and "What happened that time?"       8. CAUSE: "What do you think is causing the stomach pain?"      9. RELIEVING/AGGRAVATING FACTORS: "What makes it better or worse?" (e.g., antacids, bending or twisting motion, bowel movement)      10. OTHER SYMPTOMS: "Do you have any other symptoms?" (e.g., back pain, diarrhea, fever, urination pain, vomiting)       Chills last night  Protocols used: Abdominal Pain - Male-A-AH

## 2022-02-24 NOTE — Anesthesia Preprocedure Evaluation (Addendum)
Anesthesia Evaluation  Patient identified by MRN, date of birth, ID band Patient awake    Reviewed: Allergy & Precautions, NPO status , Patient's Chart, lab work & pertinent test results  History of Anesthesia Complications Negative for: history of anesthetic complications  Airway Mallampati: IV   Neck ROM: Full    Dental no notable dental hx.    Pulmonary neg pulmonary ROS   Pulmonary exam normal breath sounds clear to auscultation       Cardiovascular Exercise Tolerance: Good negative cardio ROS Normal cardiovascular exam Rhythm:Regular Rate:Normal     Neuro/Psych  Headaches (daily for past 6 years) Chronic shoulder pain    GI/Hepatic negative GI ROS,,,  Endo/Other  diabetes, Type 2, Insulin Dependent    Renal/GU negative Renal ROS     Musculoskeletal   Abdominal   Peds  Hematology negative hematology ROS (+)   Anesthesia Other Findings Last dose of Trulicity 6/38/75  Reproductive/Obstetrics                             Anesthesia Physical Anesthesia Plan  ASA: 2 and emergent  Anesthesia Plan: General   Post-op Pain Management:    Induction: Intravenous and Rapid sequence  PONV Risk Score and Plan: 2 and Ondansetron, Dexamethasone and Treatment may vary due to age or medical condition  Airway Management Planned: Oral ETT  Additional Equipment:   Intra-op Plan:   Post-operative Plan: Extubation in OR  Informed Consent: I have reviewed the patients History and Physical, chart, labs and discussed the procedure including the risks, benefits and alternatives for the proposed anesthesia with the patient or authorized representative who has indicated his/her understanding and acceptance.     Dental advisory given  Plan Discussed with: CRNA  Anesthesia Plan Comments: (Patient consented for risks of anesthesia including but not limited to:  - adverse reactions to  medications - damage to eyes, teeth, lips or other oral mucosa - nerve damage due to positioning  - sore throat or hoarseness - damage to heart, brain, nerves, lungs, other parts of body or loss of life  Informed patient about role of CRNA in peri- and intra-operative care.  Patient voiced understanding.)       Anesthesia Quick Evaluation

## 2022-02-24 NOTE — Anesthesia Procedure Notes (Signed)
Procedure Name: Intubation Date/Time: 02/24/2022 4:39 PM  Performed by: Lerry Liner, CRNAPre-anesthesia Checklist: Patient identified, Emergency Drugs available, Suction available and Patient being monitored Patient Re-evaluated:Patient Re-evaluated prior to induction Oxygen Delivery Method: Circle system utilized Preoxygenation: Pre-oxygenation with 100% oxygen Induction Type: IV induction Ventilation: Mask ventilation without difficulty Laryngoscope Size: McGraph and 4 Grade View: Grade I Tube type: Oral Tube size: 7.0 mm Number of attempts: 1 Airway Equipment and Method: Stylet and Oral airway Placement Confirmation: ETT inserted through vocal cords under direct vision, positive ETCO2 and breath sounds checked- equal and bilateral Secured at: 23 cm Tube secured with: Tape Dental Injury: Teeth and Oropharynx as per pre-operative assessment

## 2022-02-24 NOTE — ED Notes (Signed)
Report given to Hoyle Sauer, RN

## 2022-02-24 NOTE — Progress Notes (Signed)
Rx for home Augmentin/Percocet

## 2022-02-24 NOTE — Anesthesia Postprocedure Evaluation (Signed)
Anesthesia Post Note  Patient: Bruce Brandt  Procedure(s) Performed: APPENDECTOMY LAPAROSCOPIC  Patient location during evaluation: PACU Anesthesia Type: General Level of consciousness: awake and alert, oriented and patient cooperative Pain management: pain level controlled Vital Signs Assessment: post-procedure vital signs reviewed and stable Respiratory status: spontaneous breathing, nonlabored ventilation and respiratory function stable Cardiovascular status: blood pressure returned to baseline and stable Postop Assessment: adequate PO intake Anesthetic complications: no   No notable events documented.   Last Vitals:  Vitals:   02/24/22 1830 02/24/22 1853  BP: 112/62 106/66  Pulse: 94 95  Resp: 15 18  Temp:  36.8 C  SpO2: 94% 95%    Last Pain:  Vitals:   02/24/22 1830  TempSrc:   PainSc: [place]

## 2022-02-24 NOTE — H&P (Signed)
[facility] SUR[facility] H[facility] PHYSICAL (cpt 301 [id number])  HISTORY OF PRESENT ILLNESS (HPI):  52 y.o. male presented to [facility] ED today for abdominal pain. Patient reports the acute onset of RLQ abdominal pain last night. This was sharp in nature. He has had no relief from this. He does endorse nausea. No fever, chills, cough, CP, SOB, emesis, or bowel changes. No history of similar. No previous intra-abdominal surgeries. Work up in the ED revealed a leukocytosis to 16.3K, hyperglycemia secondary to known DM, and CT Abdomen/Pelvis was concerning for acute uncomplicated appendicitis  General surgery is consulted by emergency medicine physician Dr Blake Divine, MD for evaluation and management of acute uncomplicated appendicitis.   PAST MEDICAL HISTORY (PMH):  Past Medical History:  Diagnosis Date   Carpal tunnel syndrome, bilateral 05/18/2016   Diabetes ([place])    Frequent headaches    Gingivitis     Reviewed. Otherwise negative.   PAST SURGICAL HISTORY [facility]):  Past Surgical History:  Procedure Laterality Date   COLONOSCOPY N/A 10/17/2020   Procedure: COLONOSCOPY;  Surgeon: Jonathon Bellows, MD;  Location: [facility] ENDOSCOPY;  Service: Gastroenterology;  Laterality: N/A;   COLONOSCOPY WITH PROPOFOL     MOUTH SURGERY     gingival repair   VASECTOMY      Reviewed. Otherwise negative.   MEDICATIONS:  Prior to Admission medications   Medication Sig Start Date End Date Taking? Authorizing Provider  aspirin EC 81 MG tablet Take 81 mg by mouth daily.    [provider]  atorvastatin (LIPITOR) 10 MG tablet TAKE 1 TABLET BY MOUTH EVERY DAY 01/11/22   [name], NP  baclofen (LIORESAL) 10 MG tablet Take by mouth. 11/24/20   [provider]  Cholecalciferol (VITAMIN D-3) 125 MCG (5000 UT) TABS Take 2 capsules by mouth daily.    [provider]  Continuous Blood Gluc Sensor (FREESTYLE LIBRE 3 SENSOR) MISC 1 Device by Does not apply route every 14 (fourteen) days.  Place 1 sensor on the skin every 14 days. Use to check glucose continuously 10/15/21   [name], NP  Dulaglutide (TRULICITY) 3 JJ/9.4RD SOPN Inject 3 mg as directed once a week. 01/22/22   [name], NP  empagliflozin (JARDIANCE) 25 MG TABS tablet TAKE 1 TABLET (25 MG TOTAL) BY MOUTH DAILY. 02/22/22   [name], NP  glipiZIDE (GLUCOTROL) 10 MG tablet TAKE 1 TABLET (10 MG TOTAL) BY MOUTH TWICE A DAY BEFORE A MEAL 12/28/21   Baity, Coralie Keens, NP  glucose blood (ONETOUCH ULTRA) test strip Check blood sugar up to three times a day 09/09/20   [name], NP  hydrocortisone 2.5 % lotion Apply topically 2 (two) times daily. 11/19/19   Malfi, [name], FNP  imipramine (TOFRANIL) 25 MG tablet Take 100 mg by mouth daily.  12/27/18   [provider]  Insulin Pen Needle (INSUPEN PEN NEEDLES) 32G X 4 MM MISC BD Pen Needles- brand specific. Inject insulin via insulin pen 6 x daily 01/22/22   [name], NP  Magnesium 100 MG TABS Take by mouth.    [provider]  multivitamin-iron-minerals-folic acid (CENTRUM) chewable tablet Chew 1 tablet by mouth daily.    [provider]  Naproxen Sod-diphenhydrAMINE 220-25 MG TABS Take 2 tablets by mouth at bedtime as needed.    [provider]  TRESIBA FLEXTOUCH 100 UNIT/ML FlexTouch Pen Inject 12 Units into the skin daily.    [provider]  Turmeric 400 MG CAPS  Take by mouth.    [provider]  zonisamide (ZONEGRAN) 50 MG capsule Take 200 mg by mouth daily. 03/13/20   [provider]     ALLERGIES:  Allergies  Allergen Reactions   Covid-19 (Mrna) Vaccine Therapist, music) [Covid-19 (Mrna) Vaccine]     Bruising under great toe on both feet 1 week s/p Moderna Immunization     SOCIAL HISTORY:  Social History   Socioeconomic History   Marital status: Married    Spouse name: Not on file   Number of children: 2   Years of education: 12   Highest education level: High school graduate   Occupational History   Occupation: Games developer    Comment: Self-employed  Tobacco Use   Smoking status: Never   Smokeless tobacco: Never  Vaping Use   Vaping Use: Never used  Substance and Sexual Activity   Alcohol use: Yes    Comment: > 2 years ago had rare alcohol intake   Drug use: No   Sexual activity: Yes    Partners: Female    Comment: Married  Other Topics Concern   Not on file  Social History Narrative   Lives at home w/ his wife   Right-handed   Caffeine: three 16 oz drinks daily   Social Determinants of Health   Financial Resource Strain: Low Risk  (01/03/2018)   Overall Financial Resource Strain (CARDIA)    Difficulty of Paying Living Expenses: Not hard at all  Food Insecurity: No Food Insecurity (01/03/2018)   Hunger Vital Sign    Worried About Running Out of Food in the Last Year: Never true    [place] in the Last Year: Never true  Transportation Needs: No Transportation Needs (01/03/2018)   PRAPARE - Hydrologist (Medical): No    Lack of Transportation (Non-Medical): No  Physical Activity: Sufficiently Active (01/03/2018)   Exercise Vital Sign    Days of Exercise per Week: 5 days    Minutes of Exercise per Session: 50 min  Stress: No Stress Concern Present (01/03/2018)   [place]    Feeling of Stress : Only a little  Social Connections: Socially Integrated (01/03/2018)   Social Connection and Isolation Panel [NHANES]    Frequency of Communication with Friends and Family: Twice a week    Frequency of Social Gatherings with Friends and Family: Twice a week    Attends Religious Services: More than 4 times per year    Active Member of [employer] or Organizations: Yes    Attends Music therapist: More than 4 times per year    Marital Status: Married  Human resources officer Violence: Not At Risk (01/03/2018)   Humiliation, Afraid, Rape, and Kick  questionnaire    Fear of Current or Ex-Partner: No    Emotionally Abused: No    Physically Abused: No    Sexually Abused: No     FAMILY HISTORY:  Family History  Problem Relation Age of Onset   Heart disease Father    Diabetes Maternal Grandmother    Diabetes Paternal Grandmother     Otherwise negative.   REVIEW OF SYSTEMS:  Review of Systems  Constitutional:  Negative for chills and fever.  Respiratory:  Negative for cough and shortness of breath.   Cardiovascular:  Negative for chest pain and palpitations.  Gastrointestinal:  Positive for abdominal pain and nausea. Negative for diarrhea and vomiting.  Genitourinary:  Negative for dysuria  and urgency.  All other systems reviewed and are negative.   VITAL SIGNS:  Temp:  [98.6 F (37 C)] 98.6 F (37 C) (01/17 0952) Pulse Rate:  [101-121] 101 (01/17 1200) Resp:  [16-18] 18 (01/17 1200) BP: (108-135)/(80-84) 135/80 (01/17 1200) SpO2:  [94 %-100 %] 94 % (01/17 1200)             PHYSICAL EXAM:  Physical Exam Vitals and nursing note reviewed. Exam conducted with a chaperone present.  Constitutional:      General: He is not in acute distress.    Appearance: He is not ill-appearing.     Comments: Patient resting in bed; NAD  HENT:     Head: Normocephalic and atraumatic.  Eyes:     General: No scleral icterus.    Extraocular Movements: Extraocular movements intact.  Cardiovascular:     Rate and Rhythm: Normal rate and regular rhythm.     Heart sounds: Normal heart sounds. No murmur heard. Pulmonary:     Effort: Pulmonary effort is normal. No respiratory distress.     Breath sounds: Normal breath sounds.  Abdominal:     General: Abdomen is flat. There is no distension.     Palpations: Abdomen is soft.     Tenderness: There is abdominal tenderness in the right lower quadrant. There is no guarding or rebound. Positive signs include Rovsing's sign and McBurney's sign.     Hernia: No hernia is present.  Genitourinary:     Comments: Deferred Neurological:     General: No focal deficit present.     Mental Status: He is alert and oriented to person, place, and time.  Psychiatric:        Mood and Affect: Mood normal.        Behavior: Behavior normal.     INTAKE/OUTPUT:  This shift: No intake/output data recorded.  Last 2 shifts: @IOLAST2SHIFTS @  Labs:     Latest Ref Rng & Units 02/24/2022    9:54 AM 10/15/2021    8:19 AM 09/09/2020    8:20 AM  CBC  WBC 4.0 - 10.5 K/uL 16.3  5.8  7.2   Hemoglobin 13.0 - 17.0 g/dL 16.0  16.7  16.5   Hematocrit 39.0 - 52.0 % 50.1  51.5  50.8   Platelets 150 - 400 K/uL 240  219  232       Latest Ref Rng & Units 02/24/2022    9:54 AM 01/22/2022    8:21 AM 10/15/2021    8:19 AM  CMP  Glucose 70 - 99 mg/dL 211  166  171   BUN 6 - 20 mg/dL 12  22  23    Creatinine 0.61 - 1.24 mg/dL 0.88  0.78  0.73   Sodium 135 - 145 mmol/L 139  139  140   Potassium 3.5 - 5.1 mmol/L 4.2  5.5  4.6   Chloride 98 - 111 mmol/L 109  110  107   CO2 22 - 32 mmol/L 20  22  22    Calcium 8.9 - 10.3 mg/dL 8.8  9.6  9.6   Total Protein 6.5 - 8.1 g/dL 7.8  7.4  7.2   Total Bilirubin 0.3 - 1.2 mg/dL 0.7  0.6  0.4   Alkaline Phos 38 - 126 U/L 77     AST 15 - 41 U/L 21  18  12    ALT 0 - 44 U/L 36  22  18      Imaging studies:   CT Abdomen/Pelvis (  02/24/2022) personally reviewed showing acute uncomplicated appendicitis, no abscess, no free air, and radiologist report reviewed below;  IMPRESSION: 1. Acute uncomplicated appendicitis. 2. Bilateral pulmonary nodules measuring up to 6 mm. Non-contrast chest CT at 3-6 months is recommended. If the nodules are stable at time of repeat CT, then future CT at 18-24 months (from today's scan) is considered optional for low-risk patients, but is recommended for high-risk patients. This recommendation follows the consensus statement: Guidelines for Management of Incidental Pulmonary Nodules Detected on CT Images: From the Fleischner Society 2017; Radiology  2017; 284:228-243. 3. Two 10 mm left adrenal nodules, incompletely characterized. Recommend nonemergent adrenal protocol CT for further characterization. 4. Aortic Atherosclerosis (ICD10-I70.0).   Assessment/Plan: (ICD-10's: K35.80) 52 y.o. male with acute uncomplicated appendicitis.    - Plan for laparoscopic appendectomy today with Dr Claudine Mouton pending OR/Anesthesia availability - All risks, benefits, and alternatives to above procedure(s) were discussed with the patient, all of his questions were answered to his expressed satisfaction, patient expresses he wishes to proceed, and informed consent was obtained.    - NPO + IVF Resuscitation - IV Abx (Zosyn) - Monitor abdominal examination - SSI - Pain control prn; antiemetics prn   - Monitor leukocytosis   - DVT prophylaxis; hold for surgery   All of the above findings and recommendations were discussed with the patient and his family at bedside, and all of their questions were answered to their expressed satisfaction.  -- Lynden Oxford, PA-C  [facility] 02/24/2022, 12:36 PM M-F: 7am - 4pm

## 2022-02-24 NOTE — Transfer of Care (Signed)
Immediate Anesthesia Transfer of Care Note  Patient: Bruce Brandt  Procedure(s) Performed: APPENDECTOMY LAPAROSCOPIC  Patient Location: PACU  Anesthesia Type:General  Level of Consciousness: drowsy  Airway & Oxygen Therapy: Patient Spontanous Breathing and Patient connected to face mask oxygen  Post-op Assessment: Report given to RN  Post vital signs: stable  Last Vitals:  Vitals Value Taken Time  BP 131/80 02/24/22 1737  Temp    Pulse 103 02/24/22 1742  Resp 16 02/24/22 1742  SpO2 99 % 02/24/22 1742  Vitals shown include unvalidated device data.  Last Pain:  Vitals:   02/24/22 1548  TempSrc:   PainSc: 9          Complications: No notable events documented.

## 2022-02-24 NOTE — Interval H&P Note (Signed)
History and Physical Interval Note:  02/24/2022 4:12 PM  Bruce Brandt  has presented today for surgery, with the diagnosis of Acute Appendicitis.  The various methods of treatment have been discussed with the patient and family. After consideration of risks, benefits and other options for treatment, the patient has consented to  Procedure(s): APPENDECTOMY LAPAROSCOPIC (N/A) as a surgical intervention.  The patient's history has been reviewed, patient examined, no change in status, stable for surgery.  I have reviewed the patient's chart and labs.  Questions were answered to the patient's satisfaction.     [name]

## 2022-02-24 NOTE — ED Provider Notes (Signed)
[facility] Provider Note    Event Date/Time   First MD Initiated Contact with Patient 02/24/22 1039     (approximate)   History   Chief Complaint Abdominal Pain   HPI  Bruce Brandt is a 52 y.o. male with past medical history of hyperlipidemia and diabetes who presents to the ED complaining of abdominal pain.  Patient reports that he has been dealing with increasing pain in the right lower quadrant of his abdomen since yesterday evening.  He describes the pain as sharp and constant, radiating towards the right side of his groin.  He denies any fevers, nausea, vomiting, diarrhea, dysuria, or flank pain.  He has not noticed any redness or swelling in his groin.  He denies any history of similar symptoms, has never had surgery on his abdomen.     Physical Exam   Triage Vital Signs: ED Triage Vitals  Enc Vitals Group     BP 02/24/22 0952 108/84     Pulse Rate 02/24/22 0952 (!) 121     Resp 02/24/22 0952 16     Temp 02/24/22 0952 98.6 F (37 C)     Temp src --      SpO2 02/24/22 0952 100 %     Weight --      Height --      Head Circumference --      Peak Flow --      Pain Score 02/24/22 0937 10     Pain Loc --      Pain Edu? --      Excl. in [place]? --     Most recent vital signs: Vitals:   02/24/22 1353 02/24/22 1522  BP: 110/73 112/76  Pulse: (!) 102 99  Resp: 16 16  Temp: 99.8 F (37.7 C) 98.4 F (36.9 C)  SpO2: 95% 95%    Constitutional: Alert and oriented. Eyes: Conjunctivae are normal. Head: Atraumatic. Nose: No congestion/rhinnorhea. Mouth/Throat: Mucous membranes are moist.  Cardiovascular: Normal rate, regular rhythm. Grossly normal heart sounds.  2+ radial pulses bilaterally. Respiratory: Normal respiratory effort.  No retractions. Lungs CTAB. Gastrointestinal: Soft and tender to palpation in the right lower quadrant with no rebound or guarding. No distention. Genitourinary: No testicular edema or tenderness to palpation  noted. Musculoskeletal: No lower extremity tenderness nor edema.  Neurologic:  Normal speech and language. No gross focal neurologic deficits are appreciated.    ED Results / Procedures / Treatments   Labs (all labs ordered are listed, but only abnormal results are displayed) Labs Reviewed  COMPREHENSIVE METABOLIC PANEL - Abnormal; Notable for the following components:      Result Value   CO2 20 (*)    Glucose, Bld 211 (*)    Calcium 8.8 (*)    All other components within normal limits  CBC - Abnormal; Notable for the following components:   WBC 16.3 (*)    All other components within normal limits  URINALYSIS, ROUTINE W REFLEX MICROSCOPIC - Abnormal; Notable for the following components:   Color, Urine YELLOW (*)    APPearance CLEAR (*)    Specific Gravity, Urine 1.040 (*)    Glucose, UA >=500 (*)    Ketones, ur 20 (*)    All other components within normal limits  GLUCOSE, CAPILLARY - Abnormal; Notable for the following components:   Glucose-Capillary 152 (*)    All other components within normal limits  LIPASE, BLOOD   RADIOLOGY CT of abdomen/pelvis reviewed and interpreted by  me with inflammatory changes in the right lower quadrant consistent with appendicitis, no apparent fluid collection or dilated bowel loops noted.  PROCEDURES:  Critical Care performed: No  Procedures   MEDICATIONS ORDERED IN ED: Medications  0.9 %  sodium chloride infusion (has no administration in time range)  acetaminophen (TYLENOL) tablet 1,000 mg ( Oral Automatically Held 03/04/22 2000)  HYDROmorphone (DILAUDID) injection 0.5-1 mg ( Intravenous MAR Hold 02/24/22 1514)  oxyCODONE (Oxy IR/ROXICODONE) immediate release tablet 5-10 mg ( Oral MAR Hold 02/24/22 1514)  ondansetron (ZOFRAN-ODT) disintegrating tablet 4 mg ( Oral MAR Hold 02/24/22 1514)    Or  ondansetron (ZOFRAN) injection 4 mg ( Intravenous MAR Hold 02/24/22 1514)  insulin aspart (novoLOG) injection 0-9 Units ( Subcutaneous  Automatically Held 03/04/22 2000)  piperacillin-tazobactam (ZOSYN) IVPB 3.375 g ( Intravenous Automatically Held 03/04/22 2200)  fentaNYL (SUBLIMAZE) injection 50 mcg (has no administration in time range)  morphine (PF) 4 MG/ML injection 4 mg (4 mg Intravenous Given 02/24/22 1128)  ondansetron (ZOFRAN) injection 4 mg (4 mg Intravenous Given 02/24/22 1128)  lactated ringers bolus 1,000 mL (0 mLs Intravenous Stopped 02/24/22 1303)  iohexol (OMNIPAQUE) 300 MG/ML solution 100 mL (100 mLs Intravenous Contrast Given 02/24/22 1141)  piperacillin-tazobactam (ZOSYN) IVPB 3.375 g (0 g Intravenous Stopped 02/24/22 1345)     IMPRESSION / MDM / ASSESSMENT AND PLAN / ED COURSE  I reviewed the triage vital signs and the nursing notes.                              52 y.o. male with past medical history of hyperlipidemia and diabetes who presents to the ED complaining of increasing pain in the right lower quadrant of his abdomen since last night, radiating towards his right testicle.  Patient's presentation is most consistent with acute presentation with potential threat to life or bodily function.  Differential diagnosis includes, but is not limited to, appendicitis, testicular torsion, inguinal hernia, kidney stone, UTI.  Patient well-appearing and in no acute distress, vital signs are unremarkable.  Pain is reproducible with palpation of the right lower quadrant of his abdomen, no testicular tenderness to palpation noted.  Labs are remarkable for leukocytosis but otherwise reassuring with no significant anemia, electrolyte abnormality, or AKI.  LFTs and lipase are unremarkable, will further assess with CT imaging for appendicitis, treat symptomatically with IV morphine and Zofran.  CT abdomen/pelvis remarkable for acute uncomplicated appendicitis, will treat with IV Zosyn.  Case discussed with Dr. Christian Mate of general surgery, who will admit the patient.  His pain is much improved on reassessment.      FINAL  CLINICAL IMPRESSION(S) / ED DIAGNOSES   Final diagnoses:  Acute appendicitis, unspecified acute appendicitis type     Rx / DC Orders   ED Discharge Orders     None        Note:  This document was prepared using Dragon voice recognition software and may include unintentional dictation errors.   Blake Divine, MD 02/24/22 [phone]

## 2022-02-25 ENCOUNTER — Encounter: Payer: Self-pay | Admitting: Surgery

## 2022-02-25 LAB — CBC
HCT: 42.8 % (ref 39.0–52.0)
Hemoglobin: 13.8 g/dL (ref 13.0–17.0)
MCH: 28.5 pg (ref 26.0–34.0)
MCHC: 32.2 g/dL (ref 30.0–36.0)
MCV: 88.2 fL (ref 80.0–100.0)
Platelets: 183 10*3/uL (ref 150–400)
RBC: 4.85 MIL/uL (ref 4.22–5.81)
RDW: 12.8 % (ref 11.5–15.5)
WBC: 12.8 10*3/uL — ABNORMAL HIGH (ref 4.0–10.5)
nRBC: 0 % (ref 0.0–0.2)

## 2022-02-25 LAB — BASIC METABOLIC PANEL
Anion gap: 7 (ref 5–15)
BUN: 12 mg/dL (ref 6–20)
CO2: 20 mmol/L — ABNORMAL LOW (ref 22–32)
Calcium: 7.8 mg/dL — ABNORMAL LOW (ref 8.9–10.3)
Chloride: 113 mmol/L — ABNORMAL HIGH (ref 98–111)
Creatinine, Ser: 0.61 mg/dL (ref 0.61–1.24)
GFR, Estimated: >60 mL/min (ref >60)
Glucose, Bld: 151 mg/dL — ABNORMAL HIGH (ref 70–99)
Potassium: 3.7 mmol/L (ref 3.5–5.1)
Sodium: 140 mmol/L (ref 135–145)

## 2022-02-25 LAB — GLUCOSE, CAPILLARY
Glucose-Capillary: 153 mg/dL — ABNORMAL HIGH (ref 70–99)
Glucose-Capillary: 204 mg/dL — ABNORMAL HIGH (ref 70–99)

## 2022-02-25 MED ORDER — IBUPROFEN 600 MG PO TABS: 600.0000 mg | ORAL_TABLET | Freq: Four times a day (QID) | ORAL | 0 refills | Status: DC | PRN

## 2022-02-25 NOTE — TOC CM/SW Note (Signed)
Patient has orders to discharge home today. Chart reviewed. No TOC needs identified. CSW signing off.  Dayton Scrape, Ashley

## 2022-02-25 NOTE — Discharge Instructions (Signed)
In addition to included general post-operative instructions,  Diet: Resume home diet.   Activity: No heavy lifting >20 pounds (children, pets, laundry, garbage) or strenuous activity for 4 weeks from date of surgery, but light activity and walking are encouraged. Do not drive or drink alcohol if taking narcotic pain medications or having pain that might distract from driving.  Wound care: 2 days after surgery (01/19), you may shower/get incision wet with soapy water and pat dry (do not rub incisions), but no baths or submerging incision underwater until follow-up.   Medications: Resume all home medications. For mild to moderate pain: acetaminophen (Tylenol) or ibuprofen/naproxen (if no kidney disease). Combining Tylenol with alcohol can substantially increase your risk of causing liver disease. Narcotic pain medications, if prescribed, can be used for severe pain, though may cause nausea, constipation, and drowsiness. Do not combine Tylenol and Percocet (or similar) within a 6 hour period as Percocet (and similar) contain(s) Tylenol. If you do not need the narcotic pain medication, you do not need to fill the prescription.  Call office [phone] / [phone]) at any time if any questions, worsening pain, fevers/chills, bleeding, drainage from incision site, or other concerns.

## 2022-02-25 NOTE — Inpatient Diabetes Management (Signed)
Inpatient Diabetes Program Recommendations  AACE/ADA: New Consensus Statement on Inpatient Glycemic Control   Target Ranges:  Prepandial:   less than 140 mg/dL      Peak postprandial:   less than 180 mg/dL (1-2 hours)      Critically ill patients:  140 - 180 mg/dL    Latest Reference Range & Units 02/25/22 00:33 02/25/22 04:57  Glucose-Capillary 70 - 99 mg/dL 204 (H) 153 (H)    Latest Reference Range & Units 02/24/22 15:17 02/24/22 17:40 02/24/22 20:40  Glucose-Capillary 70 - 99 mg/dL 152 (H) 172 (H) 209 (H)    Review of Glycemic Control  Diabetes history: DM2 Outpatient Diabetes medications: Trulicity 3 mg Qweek, Jardiance 25 mg daily, Glipizide 10 mg BID, Humalog 75/25 10-14 units BID Current orders for Inpatient glycemic control: Novolog 0-9 units Q4H  Inpatient Diabetes Program Recommendations:    Insulin: Please consider ordering Semglee 10 units Q24H.  Thanks Barnie Alderman, RN, MSN, [place] Diabetes Coordinator Inpatient Diabetes Program [phone] (Team Pager from 8am to [place])

## 2022-02-25 NOTE — Plan of Care (Signed)

## 2022-02-25 NOTE — Discharge Summary (Signed)
[facility] SURGICAL DISCHARGE SUMMARY  Patient ID: Bruce Brandt MRN: [id number] DOB/AGE: [birth year] 52 y.o.  Admit date: 02/24/2022 Discharge date: 02/25/2022  Discharge Diagnoses Patient Active Problem List   Diagnosis Date Noted   Acute appendicitis 02/24/2022    Consultants None  Procedures 02/24/2022:  Laparoscopic Appendectomy   HPI: 52 y.o. male presented to [facility] ED today for abdominal pain. Patient reports the acute onset of RLQ abdominal pain last night. This was sharp in nature. He has had no relief from this. He does endorse nausea. No fever, chills, cough, CP, SOB, emesis, or bowel changes. No history of similar. No previous intra-abdominal surgeries. Work up in the ED revealed a leukocytosis to 16.3K, hyperglycemia secondary to known DM, and CT Abdomen/Pelvis was concerning for acute uncomplicated appendicitis   Hospital Course: Informed consent was obtained and documented, and patient underwent uneventful laparoscopic appendectomy (Dr Christian Mate, 02/24/2022).  Post-operatively, patient's pain improved/resolved and advancement of patient's diet and ambulation were well-tolerated. The remainder of patient's hospital course was essentially unremarkable, and discharge planning was initiated accordingly with patient safely able to be discharged home with appropriate discharge instructions, antibiotics (augmentin), pain control, and outpatient  follow-up after all of his and family's questions were answered to their expressed satisfaction.   Discharge Condition: Good   Physical Examination:  Constitutional: Well appearing male, NAD Pulmonary: Normal effort, no respiratory distress Gastrointestinal: Soft, minimal incisional soreness, non-distended, no rebound/guarding Skin: Laparoscopic incisions are CDI with dermabond, no erythema or drainage    Allergies as of 02/25/2022       Reactions   Metformin Hcl Other (See Comments)   GI upset   Covid-19 (mrna)  Vaccine (pfizer) [covid-19 (mrna) Vaccine]    Bruising under great toe on both feet 1 week s/p Moderna Immunization        Medication List     TAKE these medications    amoxicillin-clavulanate 875-125 MG tablet Commonly known as: AUGMENTIN Take 1 tablet by mouth 2 (two) times daily.   aspirin EC 81 MG tablet Take 81 mg by mouth daily.   atorvastatin 10 MG tablet Commonly known as: LIPITOR TAKE 1 TABLET BY MOUTH EVERY DAY   baclofen 10 MG tablet Commonly known as: LIORESAL Take 10 mg by mouth daily as needed (Headache).   cetirizine 10 MG tablet Commonly known as: ZYRTEC Take 10 mg by mouth daily.   Co-Enzyme Q-10 30 MG Caps Take 30 mg by mouth daily.   FreeStyle Libre 3 Sensor Misc 1 Device by Does not apply route every 14 (fourteen) days. Place 1 sensor on the skin every 14 days. Use to check glucose continuously   glipiZIDE 10 MG tablet Commonly known as: GLUCOTROL TAKE 1 TABLET (10 MG TOTAL) BY MOUTH TWICE A DAY BEFORE A MEAL   HumaLOG Mix 75/25 KwikPen (75-25) 100 UNIT/ML Kwikpen Generic drug: Insulin Lispro Prot & Lispro Inject 10-14 Units into the skin 2 (two) times daily before a meal.   hydrocortisone 2.5 % lotion Apply topically 2 (two) times daily.   ibuprofen 600 MG tablet Commonly known as: ADVIL Take 1 tablet (600 mg total) by mouth every 6 (six) hours as needed.   imipramine 50 MG tablet Commonly known as: TOFRANIL Take 100 mg by mouth daily.   Insupen Pen Needles 32G X 4 MM Misc Generic drug: Insulin Pen Needle BD Pen Needles- brand specific. Inject insulin via insulin pen 6 x daily   Jardiance 25 MG Tabs tablet Generic drug: empagliflozin TAKE 1 TABLET (  25 MG TOTAL) BY MOUTH DAILY.   Magnesium 100 MG Tabs Take 200 mg by mouth daily.   multivitamin-iron-minerals-folic acid chewable tablet Chew 1 tablet by mouth daily.   Naproxen Sod-diphenhydrAMINE 220-25 MG Tabs Take 2 tablets by mouth at bedtime as needed.   OneTouch Ultra test  strip Generic drug: glucose blood Check blood sugar up to three times a day   oxyCODONE-acetaminophen 5-325 MG tablet Commonly known as: Percocet Take 1-2 tablets by mouth every 4 (four) hours as needed for severe pain.   Tyler Aas FlexTouch 100 UNIT/ML FlexTouch Pen Generic drug: insulin degludec Inject 12 Units into the skin daily.   Trulicity 3 DU/2.0UR Sopn Generic drug: Dulaglutide Inject 3 mg as directed once a week.   Turmeric 400 MG Caps Take 400 mg by mouth daily.   Vitamin D-3 125 MCG (5000 UT) Tabs Take 2 capsules by mouth daily.   zonisamide 100 MG capsule Commonly known as: ZONEGRAN Take 300 mg by mouth daily.          Follow-up Information     Tylene Fantasia, PA-C. Schedule an appointment as soon as possible for a visit in 3 week(s).   Specialty: Physician Assistant Why: s/p lap appy Contact information: [address] [place] [address] [phone]                  Time spent on discharge management including discussion of hospital course, clinical condition, outpatient instructions, prescriptions, and follow up with the patient and members of the medical team: >30 minutes  -- Edison Simon , PA-C  [facility]  02/25/2022, 8:27 AM [phone] M-F: 7am - 4pm

## 2022-02-26 ENCOUNTER — Other Ambulatory Visit (HOSPITAL_COMMUNITY): Payer: Self-pay

## 2022-02-26 LAB — SURGICAL PATHOLOGY

## 2022-03-01 ENCOUNTER — Other Ambulatory Visit (HOSPITAL_COMMUNITY): Payer: Self-pay

## 2022-03-08 ENCOUNTER — Other Ambulatory Visit (HOSPITAL_COMMUNITY): Payer: Self-pay

## 2022-03-16 ENCOUNTER — Ambulatory Visit (INDEPENDENT_AMBULATORY_CARE_PROVIDER_SITE_OTHER): Payer: Managed Care, Other (non HMO) | Admitting: Physician Assistant

## 2022-03-16 ENCOUNTER — Encounter: Payer: Self-pay | Admitting: Physician Assistant

## 2022-03-16 VITALS — BP 123/79 | HR 105 | Temp 99.1°F | Ht 72.0 in | Wt 217.2 lb

## 2022-03-16 DIAGNOSIS — K353 Acute appendicitis with localized peritonitis, without perforation or gangrene: Secondary | ICD-10-CM

## 2022-03-16 DIAGNOSIS — Z09 Encounter for follow-up examination after completed treatment for conditions other than malignant neoplasm: Secondary | ICD-10-CM

## 2022-03-16 DIAGNOSIS — K3532 Acute appendicitis with perforation and localized peritonitis, without abscess: Secondary | ICD-10-CM

## 2022-03-16 NOTE — Patient Instructions (Signed)

## 2022-03-16 NOTE — Progress Notes (Signed)
[facility] POST-OP OFFICE VISIT  03/16/2022  HPI: Bruce Brandt is a 52 y.o. male 20 days s/p laparoscopic appendectomy with Dr Christian Mate   He is doing great No abdominal pain, nausea, emesis, fever, chills Tolerating PO; no issues with bowel function No issues with incisions No other complaints   Vital signs: BP 123/79   Pulse (!) 105   Temp 99.1 F (37.3 C) (Oral)   Ht 6' (1.829 m)   Wt 217 lb 3.2 oz (98.5 kg)   SpO2 91%   BMI 29.46 kg/m    Physical Exam: Constitutional: Well appearing male, NAD Abdomen: Soft, non-tender, non-distended, no rebound/guarding Skin: Laparoscopic incisions are healing well, no erythema or drainage   Assessment/Plan: This is a 52 y.o. male 20 days s/p laparoscopic appendectomy with Dr Christian Mate    - Pain control prn  - Reviewed wound care recommendation  - Reviewed lifting restrictions; 4 weeks total  - Reviewed surgical pathology; Appendicitis   - He can follow up on as needed basis; He understands to call with questions/concerns  -- Edison Simon, PA-C [facility] 03/16/2022, 2:03 PM M-F: 7am - 4pm

## 2022-03-21 ENCOUNTER — Other Ambulatory Visit: Payer: Self-pay | Admitting: Internal Medicine

## 2022-03-22 NOTE — Telephone Encounter (Signed)
Requested medication (s) are due for refill today: no  Requested medication (s) are on the active medication list: yes  Last refill:  02/22/22  Future visit scheduled: yes  Notes to clinic:    Pharmacy comment: Alternative Requested:PATIENT STATED THIS HAD GOTTEN APPROVED.  INSURANCE IS STILL STATING A PA IS NECESSARY.       Requested Prescriptions  Pending Prescriptions Disp Refills   JARDIANCE 25 MG TABS tablet [Pharmacy Med Name: JARDIANCE 25 MG TABLET] 30 tablet 2    Sig: TAKE 1 TABLET (25 MG TOTAL) BY MOUTH DAILY.     Endocrinology:  Diabetes - SGLT2 Inhibitors Passed - 03/21/2022  2:17 PM      Passed - Cr in normal range and within 360 days    Creat  Date Value Ref Range Status  01/22/2022 0.78 0.70 - 1.30 mg/dL Final   Creatinine, Ser  Date Value Ref Range Status  02/25/2022 0.61 0.61 - 1.24 mg/dL Final   Creatinine, Urine  Date Value Ref Range Status  01/22/2022 54 20 - 320 mg/dL Final         Passed - HBA1C is between 0 and 7.9 and within 180 days    Hgb A1c MFr Bld  Date Value Ref Range Status  01/22/2022 7.4 (H) <5.7 % of total Hgb Final    Comment:    For someone without known diabetes, a hemoglobin A1c value of 6.5% or greater indicates that they may have  diabetes and this should be confirmed with a follow-up  test. . For someone with known diabetes, a value <7% indicates  that their diabetes is well controlled and a value  greater than or equal to 7% indicates suboptimal  control. A1c targets should be individualized based on  duration of diabetes, age, comorbid conditions, and  other considerations. . Currently, no consensus exists regarding use of hemoglobin A1c for diagnosis of diabetes for children. .          Passed - eGFR in normal range and within 360 days    GFR, Est African American  Date Value Ref Range Status  05/10/2019 138 > OR = 60 mL/min/1.37m Final   GFR, Est Non African American  Date Value Ref Range Status  05/10/2019  119 > OR = 60 mL/min/1.777mFinal   GFR, Estimated  Date Value Ref Range Status  02/25/2022 >60 >60 mL/min Final    Comment:    (NOTE) Calculated using the CKD-EPI Creatinine Equation (2021)    eGFR  Date Value Ref Range Status  01/22/2022 108 > OR = 60 mL/min/1.7380minal         Passed - Valid encounter within last 6 months    Recent Outpatient Visits           1 month ago Type 2 diabetes mellitus with hyperglycemia, without long-term current use of insulin [facility]   5 months ago Encounter for general adult medical examination with abnormal findings   [facility]   9 months ago Sore throat   [facility] [place] [facility]   11 months ago Type 2 diabetes mellitus with hyperglycemia, without long-term current use of insulin ([facility]   1 year ago Need for immunization against influenza   [facility] [place]

## 2022-03-27 ENCOUNTER — Other Ambulatory Visit: Payer: Self-pay | Admitting: Internal Medicine

## 2022-03-29 NOTE — Telephone Encounter (Signed)
Requested medications are due for refill today.  See pharmacy note  Requested medications are on the active medications list.  yes  Last refill. 03/23/2022 #900 rf  Future visit scheduled.   no  Notes to clinic.  Pharmacy comment: Alternative Requested.     Requested Prescriptions  Pending Prescriptions Disp Refills   JARDIANCE 25 MG TABS tablet [Pharmacy Med Name: JARDIANCE 25 MG TABLET] 90 tablet 0    Sig: TAKE 1 TABLET (25 MG TOTAL) BY MOUTH DAILY.     Endocrinology:  Diabetes - SGLT2 Inhibitors Passed - 03/27/2022 11:59 AM      Passed - Cr in normal range and within 360 days    Creat  Date Value Ref Range Status  01/22/2022 0.78 0.70 - 1.30 mg/dL Final   Creatinine, Ser  Date Value Ref Range Status  02/25/2022 0.61 0.61 - 1.24 mg/dL Final   Creatinine, Urine  Date Value Ref Range Status  01/22/2022 54 20 - 320 mg/dL Final         Passed - HBA1C is between 0 and 7.9 and within 180 days    Hgb A1c MFr Bld  Date Value Ref Range Status  01/22/2022 7.4 (H) <5.7 % of total Hgb Final    Comment:    For someone without known diabetes, a hemoglobin A1c value of 6.5% or greater indicates that they may have  diabetes and this should be confirmed with a follow-up  test. . For someone with known diabetes, a value <7% indicates  that their diabetes is well controlled and a value  greater than or equal to 7% indicates suboptimal  control. A1c targets should be individualized based on  duration of diabetes, age, comorbid conditions, and  other considerations. . Currently, no consensus exists regarding use of hemoglobin A1c for diagnosis of diabetes for children. .          Passed - eGFR in normal range and within 360 days    GFR, Est African American  Date Value Ref Range Status  05/10/2019 138 > OR = 60 mL/min/1.63m Final   GFR, Est Non African American  Date Value Ref Range Status  05/10/2019 119 > OR = 60 mL/min/1.765mFinal   GFR, Estimated  Date Value Ref  Range Status  02/25/2022 >60 >60 mL/min Final    Comment:    (NOTE) Calculated using the CKD-EPI Creatinine Equation (2021)    eGFR  Date Value Ref Range Status  01/22/2022 108 > OR = 60 mL/min/1.7367minal         Passed - Valid encounter within last 6 months    Recent Outpatient Visits           2 months ago Type 2 diabetes mellitus with hyperglycemia, without long-term current use of insulin ([facility]   5 months ago Encounter for general adult medical examination with abnormal findings   [facility]   9 months ago Sore throat   [facility] [facility]   11 months ago Type 2 diabetes mellitus with hyperglycemia, without long-term current use of insulin ([facility]   1 year ago Need for immunization against influenza   [facility] [place]

## 2022-03-30 ENCOUNTER — Other Ambulatory Visit: Payer: Self-pay | Admitting: Internal Medicine

## 2022-03-31 NOTE — Telephone Encounter (Signed)
Requested medication (s) are due for refill today - no  Requested medication (s) are on the active medication list -yes  Future visit scheduled -no  Last refill: 03/26/22  Notes to clinic: Pharmacy request: Alternative requested- not covered by insurance  Requested Prescriptions  Pending Prescriptions Disp Refills   JARDIANCE 25 MG TABS tablet [Pharmacy Med Name: JARDIANCE 25 MG TABLET] 90 tablet 0    Sig: TAKE 1 TABLET (25 MG TOTAL) BY MOUTH DAILY.     Endocrinology:  Diabetes - SGLT2 Inhibitors Passed - 03/30/2022  9:37 AM      Passed - Cr in normal range and within 360 days    Creat  Date Value Ref Range Status  01/22/2022 0.78 0.70 - 1.30 mg/dL Final   Creatinine, Ser  Date Value Ref Range Status  02/25/2022 0.61 0.61 - 1.24 mg/dL Final   Creatinine, Urine  Date Value Ref Range Status  01/22/2022 54 20 - 320 mg/dL Final         Passed - HBA1C is between 0 and 7.9 and within 180 days    Hgb A1c MFr Bld  Date Value Ref Range Status  01/22/2022 7.4 (H) <5.7 % of total Hgb Final    Comment:    For someone without known diabetes, a hemoglobin A1c value of 6.5% or greater indicates that they may have  diabetes and this should be confirmed with a follow-up  test. . For someone with known diabetes, a value <7% indicates  that their diabetes is well controlled and a value  greater than or equal to 7% indicates suboptimal  control. A1c targets should be individualized based on  duration of diabetes, age, comorbid conditions, and  other considerations. . Currently, no consensus exists regarding use of hemoglobin A1c for diagnosis of diabetes for children. .          Passed - eGFR in normal range and within 360 days    GFR, Est African American  Date Value Ref Range Status  05/10/2019 138 > OR = 60 mL/min/1.20m Final   GFR, Est Non African American  Date Value Ref Range Status  05/10/2019 119 > OR = 60 mL/min/1.770mFinal   GFR, Estimated  Date Value Ref Range  Status  02/25/2022 >60 >60 mL/min Final    Comment:    (NOTE) Calculated using the CKD-EPI Creatinine Equation (2021)    eGFR  Date Value Ref Range Status  01/22/2022 108 > OR = 60 mL/min/1.7343minal         Passed - Valid encounter within last 6 months    Recent Outpatient Visits           2 months ago Type 2 diabetes mellitus with hyperglycemia, without long-term current use of insulin ([facility]   5 months ago Encounter for general adult medical examination with abnormal findings   [facility]   9 months ago Sore throat   [facility] [place] [facility]   11 months ago Type 2 diabetes mellitus with hyperglycemia, without long-term current use of insulin ([facility]August[facility]Coral[facility] Ke[facility]P   1 year ago Need for immunization against influenza   [facility]                 Requested Prescriptions  Pending Prescriptions Disp Refills   JARDIANCE 25 MG TABS tablet [Pharmacy Med Name: JARDIANCE 25 MG TABLET] 90 tablet 0    Sig: TAKE 1 TABLET (25 MG TOTAL) BY MOUTH DAILY.     Endocrinology:  Diabetes - SGLT2 Inhibitors Passed - 03/30/2022  9:37 AM      Passed - Cr in normal range and within 360 days    Creat  Date Value Ref Range Status  01/22/2022 0.78 0.70 - 1.30 mg/dL Final   Creatinine, Ser  Date Value Ref Range Status  02/25/2022 0.61 0.61 - 1.24 mg/dL Final   Creatinine, Urine  Date Value Ref Range Status  01/22/2022 54 20 - 320 mg/dL Final         Passed - HBA1C is between 0 and 7.9 and within 180 days    Hgb A1c MFr Bld  Date Value Ref Range Status  01/22/2022 7.4 (H) <5.7 % of total Hgb Final    Comment:    For someone without known diabetes, a hemoglobin A1c value of 6.5% or greater indicates that they may have  diabetes and this should be  confirmed with a follow-up  test. . For someone with known diabetes, a value <7% indicates  that their diabetes is well controlled and a value  greater than or equal to 7% indicates suboptimal  control. A1c targets should be individualized based on  duration of diabetes, age, comorbid conditions, and  other considerations. . Currently, no consensus exists regarding use of hemoglobin A1c for diagnosis of diabetes for children. .          Passed - eGFR in normal range and within 360 days    GFR, Est African American  Date Value Ref Range Status  05/10/2019 138 > OR = 60 mL/min/1.35m Final   GFR, Est Non African American  Date Value Ref Range Status  05/10/2019 119 > OR = 60 mL/min/1.722mFinal   GFR, Estimated  Date Value Ref Range Status  02/25/2022 >60 >60 mL/min Final    Comment:    (NOTE) Calculated using the CKD-EPI Creatinine Equation (2021)    eGFR  Date Value Ref Range Status  01/22/2022 108 > OR = 60 mL/min/1.7335minal         Passed - Valid encounter within last 6 months    Recent Outpatient Visits           2 months ago Type 2 diabetes mellitus with hyperglycemia, without long-term current use of insulin ([facility]   5 months ago Encounter for general adult medical examination with abnormal findings   [facility] Center[facility]North[facility]egC[facility] Ke[facility]P   9 months ago Sore throat   [facility] [place] NP   11 months ago Type 2 diabetes mellitus with hyperglycemia, without long-term current use of insulin ([facility] ConDover [facility] CenteriMattydaleegCoralie KeensP   1 year ago Need for immunization against influenza   [facility] CenteriPort Tobacco VillageegCoralie KeensPWis[place]

## 2022-04-07 ENCOUNTER — Other Ambulatory Visit: Payer: Self-pay | Admitting: Internal Medicine

## 2022-04-08 NOTE — Telephone Encounter (Signed)
Has a prior Auth been done on this medication?

## 2022-04-08 NOTE — Telephone Encounter (Signed)
Requested medications are due for refill today.  See note  Requested medications are on the active medications list.  yes  Last refill. 03/23/2022 #90 0 rf - not filled  Future visit scheduled.   no  Notes to clinic.  Pharmacy comment: Alternative Requested:PRIOR AUTH REQUIRED.     Requested Prescriptions  Pending Prescriptions Disp Refills   JARDIANCE 25 MG TABS tablet [Pharmacy Med Name: JARDIANCE 25 MG TABLET] 90 tablet 0    Sig: TAKE 1 TABLET (25 MG TOTAL) BY MOUTH DAILY.     Endocrinology:  Diabetes - SGLT2 Inhibitors Passed - 04/07/2022  4:44 PM      Passed - Cr in normal range and within 360 days    Creat  Date Value Ref Range Status  01/22/2022 0.78 0.70 - 1.30 mg/dL Final   Creatinine, Ser  Date Value Ref Range Status  02/25/2022 0.61 0.61 - 1.24 mg/dL Final   Creatinine, Urine  Date Value Ref Range Status  01/22/2022 54 20 - 320 mg/dL Final         Passed - HBA1C is between 0 and 7.9 and within 180 days    Hgb A1c MFr Bld  Date Value Ref Range Status  01/22/2022 7.4 (H) <5.7 % of total Hgb Final    Comment:    For someone without known diabetes, a hemoglobin A1c value of 6.5% or greater indicates that they may have  diabetes and this should be confirmed with a follow-up  test. . For someone with known diabetes, a value <7% indicates  that their diabetes is well controlled and a value  greater than or equal to 7% indicates suboptimal  control. A1c targets should be individualized based on  duration of diabetes, age, comorbid conditions, and  other considerations. . Currently, no consensus exists regarding use of hemoglobin A1c for diagnosis of diabetes for children. .          Passed - eGFR in normal range and within 360 days    GFR, Est African American  Date Value Ref Range Status  05/10/2019 138 > OR = 60 mL/min/1.45m Final   GFR, Est Non African American  Date Value Ref Range Status  05/10/2019 119 > OR = 60 mL/min/1.754mFinal   GFR,  Estimated  Date Value Ref Range Status  02/25/2022 >60 >60 mL/min Final    Comment:    (NOTE) Calculated using the CKD-EPI Creatinine Equation (2021)    eGFR  Date Value Ref Range Status  01/22/2022 108 > OR = 60 mL/min/1.7373minal         Passed - Valid encounter within last 6 months    Recent Outpatient Visits           2 months ago Type 2 diabetes mellitus with hyperglycemia, without long-term current use of insulin ([facility] ConC[facility] CenteriJoppaegCoralie KeensP   5 months ago Encounter for general adult medical examination with abnormal findings   Con[facility] CenteriMidwayegCoralie KeensP   10 months ago Sore throat   [facility] CenteriRidgefieldeg[place] NP   11 months ago Type 2 diabetes mellitus with hyperglycemia, without long-term current use of insulin ([facility] Con[facility] CenteriT[place]egCoralie KeensP   1 year ago Need for immunization against influenza   ConAlbin [facility] CenteriTammsegCoralie KeensPWis[place]

## 2022-04-14 NOTE — Telephone Encounter (Signed)
PA has been submitted to pt's insurance.   Thanks,   -Mickel Baas

## 2022-04-16 ENCOUNTER — Other Ambulatory Visit: Payer: Self-pay | Admitting: Internal Medicine

## 2022-04-16 DIAGNOSIS — E1165 Type 2 diabetes mellitus with hyperglycemia: Secondary | ICD-10-CM

## 2022-04-16 NOTE — Telephone Encounter (Signed)
Requested Prescriptions  Pending Prescriptions Disp Refills   atorvastatin (LIPITOR) 10 MG tablet [Pharmacy Med Name: ATORVASTATIN 10 MG TABLET] 90 tablet 2    Sig: TAKE 1 TABLET BY MOUTH EVERY DAY     Cardiovascular:  Antilipid - Statins Failed - 04/16/2022  1:56 AM      Failed - Lipid Panel in normal range within the last 12 months    Cholesterol  Date Value Ref Range Status  01/22/2022 211 (H) <200 mg/dL Final   LDL Cholesterol (Calc)  Date Value Ref Range Status  01/22/2022 130 (H) mg/dL (calc) Final    Comment:    Reference range: <100 . Desirable range <100 mg/dL for primary prevention;   <70 mg/dL for patients with CHD or diabetic patients  with > or = 2 CHD risk factors. Marland Kitchen LDL-C is now calculated using the Martin-Hopkins  calculation, which is a validated novel method providing  better accuracy than the Friedewald equation in the  estimation of LDL-C.  Cresenciano Genre et al. Annamaria Helling. [id number]): 2061-2068  (http://education.[employer].com/faq/FAQ164)    HDL  Date Value Ref Range Status  01/22/2022 53 > OR = 40 mg/dL Final   Triglycerides  Date Value Ref Range Status  01/22/2022 160 (H) <150 mg/dL Final         Passed - Patient is not pregnant      Passed - Valid encounter within last 12 months    Recent Outpatient Visits           2 months ago Type 2 diabetes mellitus with hyperglycemia, without long-term current use of insulin [facility])   [facility] [place], Coralie Keens, NP   6 months ago Encounter for general adult medical examination with abnormal findings   [facility] [place], Coralie Keens, NP   10 months ago Sore throat   [facility] [place], [place] W, NP   1 year ago Type 2 diabetes mellitus with hyperglycemia, without long-term current use of insulin [facility])   [facility] [place], Coralie Keens, NP   1 year ago Need for immunization against influenza   [facility] [place], Coralie Keens, [place]

## 2022-04-22 NOTE — Telephone Encounter (Signed)
Please call the pharmacy.  They keep sending me that he needs a prior Auth but I just got notice that this was approved by his insurance

## 2022-05-18 ENCOUNTER — Encounter: Payer: Self-pay | Admitting: Internal Medicine

## 2022-05-18 LAB — HM DIABETES EYE EXAM

## 2022-07-12 ENCOUNTER — Other Ambulatory Visit: Payer: Self-pay | Admitting: Internal Medicine

## 2022-07-13 NOTE — Telephone Encounter (Signed)
Requested Prescriptions  Pending Prescriptions Disp Refills   JARDIANCE 25 MG TABS tablet [Pharmacy Med Name: JARDIANCE 25 MG TABLET] 90 tablet 0    Sig: TAKE 1 TABLET (25 MG TOTAL) BY MOUTH DAILY.     Endocrinology:  Diabetes - SGLT2 Inhibitors Passed - 07/12/2022  2:36 AM      Passed - Cr in normal range and within 360 days    Creat  Date Value Ref Range Status  01/22/2022 0.78 0.70 - 1.30 mg/dL Final   Creatinine, Ser  Date Value Ref Range Status  02/25/2022 0.61 0.61 - 1.24 mg/dL Final   Creatinine, Urine  Date Value Ref Range Status  01/22/2022 54 20 - 320 mg/dL Final         Passed - HBA1C is between 0 and 7.9 and within 180 days    Hgb A1c MFr Bld  Date Value Ref Range Status  01/22/2022 7.4 (H) <5.7 % of total Hgb Final    Comment:    For someone without known diabetes, a hemoglobin A1c value of 6.5% or greater indicates that they may have  diabetes and this should be confirmed with a follow-up  test. . For someone with known diabetes, a value <7% indicates  that their diabetes is well controlled and a value  greater than or equal to 7% indicates suboptimal  control. A1c targets should be individualized based on  duration of diabetes, age, comorbid conditions, and  other considerations. . Currently, no consensus exists regarding use of hemoglobin A1c for diagnosis of diabetes for children. .          Passed - eGFR in normal range and within 360 days    GFR, Est African American  Date Value Ref Range Status  05/10/2019 138 > OR = 60 mL/min/1.70m2 Final   GFR, Est Non African American  Date Value Ref Range Status  05/10/2019 119 > OR = 60 mL/min/1.44m2 Final   GFR, Estimated  Date Value Ref Range Status  02/25/2022 >60 >60 mL/min Final    Comment:    (NOTE) Calculated using the CKD-EPI Creatinine Equation (2021)    eGFR  Date Value Ref Range Status  01/22/2022 108 > OR = 60 mL/min/1.62m2 Final         Passed - Valid encounter within last 6 months     Recent Outpatient Visits           5 months ago Type 2 diabetes mellitus with hyperglycemia, without long-term current use of insulin [facility])   [place] [facility] [place], Salvadore Oxford, NP   9 months ago Encounter for general adult medical examination with abnormal findings   [place] [facility] [place], Salvadore Oxford, NP   1 year ago Sore throat   [place] [facility] [place], [place] W, NP   1 year ago Type 2 diabetes mellitus with hyperglycemia, without long-term current use of insulin [facility])   [place] [facility] [place], Salvadore Oxford, NP   1 year ago Need for immunization against influenza   [place] [facility] [place], Salvadore Oxford, [place]

## 2022-09-14 LAB — COMPREHENSIVE METABOLIC PANEL: eGFR: 107

## 2022-09-14 LAB — HEMOGLOBIN A1C: Hemoglobin A1C: 7.9

## 2022-09-14 LAB — MICROALBUMIN / CREATININE URINE RATIO: Microalb Creat Ratio: 16

## 2022-09-14 LAB — BASIC METABOLIC PANEL: Creatinine: 0.8 (ref 0.6–1.3)

## 2022-09-14 LAB — PROTEIN / CREATININE RATIO, URINE
Albumin, U: 18.2
Creatinine, Urine: 116.5

## 2022-09-14 LAB — LIPID PANEL
Cholesterol: 165 (ref 0–200)
LDL Cholesterol: 99
Triglycerides: 69 (ref 40–160)

## 2022-09-15 LAB — PROTEIN / CREATININE RATIO, URINE
Albumin, U: 18.2
Creatinine, Urine: 116.5

## 2022-09-15 LAB — MICROALBUMIN / CREATININE URINE RATIO: Microalb Creat Ratio: 16

## 2022-11-13 ENCOUNTER — Other Ambulatory Visit: Payer: Self-pay | Admitting: Internal Medicine

## 2022-11-15 NOTE — Telephone Encounter (Signed)
Attempted to call patient- no answer- message left to schedule appointment. Courtesy 30 day refill given. Requested Prescriptions  Pending Prescriptions Disp Refills   JARDIANCE 25 MG TABS tablet [Pharmacy Med Name: JARDIANCE 25 MG TABLET] 30 tablet 2    Sig: TAKE 1 TABLET (25 MG TOTAL) BY MOUTH DAILY.     Endocrinology:  Diabetes - SGLT2 Inhibitors Failed - 11/13/2022  8:52 AM      Failed - HBA1C is between 0 and 7.9 and within 180 days    Hgb A1c MFr Bld  Date Value Ref Range Status  01/22/2022 7.4 (H) <5.7 % of total Hgb Final    Comment:    For someone without known diabetes, a hemoglobin A1c value of 6.5% or greater indicates that they may have  diabetes and this should be confirmed with a follow-up  test. . For someone with known diabetes, a value <7% indicates  that their diabetes is well controlled and a value  greater than or equal to 7% indicates suboptimal  control. A1c targets should be individualized based on  duration of diabetes, age, comorbid conditions, and  other considerations. . Currently, no consensus exists regarding use of hemoglobin A1c for diagnosis of diabetes for children. .          Failed - Valid encounter within last 6 months    Recent Outpatient Visits           9 months ago Type 2 diabetes mellitus with hyperglycemia, without long-term current use of insulin [facility])   [place] [facility] [place], Salvadore Oxford, NP   1 year ago Encounter for general adult medical examination with abnormal findings   [place] [facility] [place], Salvadore Oxford, NP   1 year ago Sore throat   [place] [facility] [place], [place] W, NP   1 year ago Type 2 diabetes mellitus with hyperglycemia, without long-term current use of insulin [facility])   [place] [facility] [place], Salvadore Oxford, NP   1 year ago Need for immunization against influenza   [facility] [place], [place], NP               Passed - Cr in normal range and within 360 days    Creat  Date Value Ref Range Status  01/22/2022 0.78 0.70 - 1.30 mg/dL Final   Creatinine, Ser  Date Value Ref Range Status  02/25/2022 0.61 0.61 - 1.24 mg/dL Final   Creatinine, Urine  Date Value Ref Range Status  01/22/2022 54 20 - 320 mg/dL Final         Passed - eGFR in normal range and within 360 days    GFR, Est African American  Date Value Ref Range Status  05/10/2019 138 > OR = 60 mL/min/1.22m2 Final   GFR, Est Non African American  Date Value Ref Range Status  05/10/2019 119 > OR = 60 mL/min/1.66m2 Final   GFR, Estimated  Date Value Ref Range Status  02/25/2022 >60 >60 mL/min Final    Comment:    (NOTE) Calculated using the CKD-EPI Creatinine Equation (2021)    eGFR  Date Value Ref Range Status  01/22/2022 108 > OR = 60 mL/min/1.42m2 Final

## 2022-12-08 ENCOUNTER — Encounter: Payer: Self-pay | Admitting: Internal Medicine

## 2022-12-08 ENCOUNTER — Ambulatory Visit (INDEPENDENT_AMBULATORY_CARE_PROVIDER_SITE_OTHER): Payer: 59 | Admitting: Internal Medicine

## 2022-12-08 VITALS — BP 120/80 | Ht 72.0 in | Wt 210.0 lb

## 2022-12-08 DIAGNOSIS — Z125 Encounter for screening for malignant neoplasm of prostate: Secondary | ICD-10-CM

## 2022-12-08 DIAGNOSIS — E785 Hyperlipidemia, unspecified: Secondary | ICD-10-CM

## 2022-12-08 DIAGNOSIS — Z6828 Body mass index (BMI) 28.0-28.9, adult: Secondary | ICD-10-CM

## 2022-12-08 DIAGNOSIS — E663 Overweight: Secondary | ICD-10-CM

## 2022-12-08 DIAGNOSIS — Z0001 Encounter for general adult medical examination with abnormal findings: Secondary | ICD-10-CM | POA: Diagnosis not present

## 2022-12-08 DIAGNOSIS — E1169 Type 2 diabetes mellitus with other specified complication: Secondary | ICD-10-CM

## 2022-12-08 DIAGNOSIS — Z23 Encounter for immunization: Secondary | ICD-10-CM

## 2022-12-08 MED ORDER — ATORVASTATIN CALCIUM 20 MG PO TABS: 20.0000 mg | ORAL_TABLET | Freq: Every day | ORAL | 1 refills | Status: DC

## 2022-12-08 NOTE — Progress Notes (Signed)
Subjective:    Patient ID: Bruce Brandt, male    DOB: 1970-12-26, 52 y.o.   MRN: [id number]  HPI  Patient presents the clinic today for his annual exam.  Flu: 10/2021 Tetanus: 03/2018 COVID: X 2 Pneumovax: Never Shingrix: Never PSA screening: 10/2021 Colon screening: 10/2020 Vision screening: annually, [facility]: 3 x year  Diet: He does eat meat. He consumes fruits and veggies. He does eat some fried foods. He drinks mostly water, Dt. Powerade, dt. Tea, coffee Exercise: None  Review of Systems     Past Medical History:  Diagnosis Date   Carpal tunnel syndrome, bilateral 05/18/2016   Diabetes (HCC)    Frequent headaches    Gingivitis     Current Outpatient Medications  Medication Sig Dispense Refill   aspirin EC 81 MG tablet Take 81 mg by mouth daily.     atorvastatin (LIPITOR) 10 MG tablet TAKE 1 TABLET BY MOUTH EVERY DAY 90 tablet 2   baclofen (LIORESAL) 10 MG tablet Take 10 mg by mouth daily as needed (Headache).     cetirizine (ZYRTEC) 10 MG tablet Take 10 mg by mouth daily.     Cholecalciferol (VITAMIN D-3) 125 MCG (5000 UT) TABS Take 2 capsules by mouth daily.     Co-Enzyme Q-10 30 MG CAPS Take 30 mg by mouth daily.     Continuous Blood Gluc Sensor (FREESTYLE LIBRE 3 SENSOR) MISC 1 Device by Does not apply route every 14 (fourteen) days. Place 1 sensor on the skin every 14 days. Use to check glucose continuously 6 each 1   Dulaglutide (TRULICITY) 3 MG/0.5ML SOPN Inject 3 mg as directed once a week. 6 mL 0   empagliflozin (JARDIANCE) 25 MG TABS tablet TAKE 1 TABLET (25 MG TOTAL) BY MOUTH DAILY. 30 tablet 0   glipiZIDE (GLUCOTROL) 10 MG tablet TAKE 1 TABLET (10 MG TOTAL) BY MOUTH TWICE A DAY BEFORE A MEAL 180 tablet 0   glucose blood (ONETOUCH ULTRA) test strip Check blood sugar up to three times a day 100 each 12   HUMALOG MIX 75/25 KWIKPEN (75-25) 100 UNIT/ML KwikPen Inject 10-14 Units into the skin 2 (two) times daily before a meal.     hydrocortisone 2.5  % lotion Apply topically 2 (two) times daily. 59 mL 0   ibuprofen (ADVIL) 600 MG tablet Take 1 tablet (600 mg total) by mouth every 6 (six) hours as needed. 30 tablet 0   imipramine (TOFRANIL) 50 MG tablet Take 100 mg by mouth daily.     Insulin Pen Needle (INSUPEN PEN NEEDLES) 32G X 4 MM MISC BD Pen Needles- brand specific. Inject insulin via insulin pen 6 x daily 200 each 3   Magnesium 100 MG TABS Take 200 mg by mouth daily.     multivitamin-iron-minerals-folic acid (CENTRUM) chewable tablet Chew 1 tablet by mouth daily.     Naproxen Sod-diphenhydrAMINE 220-25 MG TABS Take 2 tablets by mouth at bedtime as needed.     TRESIBA FLEXTOUCH 100 UNIT/ML FlexTouch Pen Inject 12 Units into the skin daily.     Turmeric 400 MG CAPS Take 400 mg by mouth daily.     zonisamide (ZONEGRAN) 100 MG capsule Take 300 mg by mouth daily.     No current facility-administered medications for this visit.    Allergies  Allergen Reactions   Metformin Hcl Other (See Comments)    GI upset   Covid-19 (Mrna) Vaccine Proofreader) [Covid-19 (Mrna) Vaccine]     Bruising under great  toe on both feet 1 week s/p Moderna Immunization    Family History  Problem Relation Age of Onset   Heart disease Father    Diabetes Maternal Grandmother    Diabetes Paternal Grandmother     Social History   Socioeconomic History   Marital status: Married    Spouse name: Not on file   Number of children: 2   Years of education: 12   Highest education level: High school graduate  Occupational History   Occupation: Music therapist    Comment: Self-employed  Tobacco Use   Smoking status: Never   Smokeless tobacco: Never  Vaping Use   Vaping status: Never Used  Substance and Sexual Activity   Alcohol use: Yes    Comment: > 2 years ago had rare alcohol intake   Drug use: No   Sexual activity: Yes    Partners: Female    Comment: Married  Other Topics Concern   Not on file  Social History Narrative   Lives at home w/ his wife    Right-handed   Caffeine: three 16 oz drinks daily   Social Determinants of Health   Financial Resource Strain: Low Risk  (01/03/2018)   Overall Financial Resource Strain (CARDIA)    Difficulty of Paying Living Expenses: Not hard at all  Food Insecurity: No Food Insecurity (02/24/2022)   Hunger Vital Sign    Worried About Running Out of Food in the Last Year: Never true    Ran Out of Food in the Last Year: Never true  Transportation Needs: No Transportation Needs (02/24/2022)   PRAPARE - Administrator, Civil Service (Medical): No    Lack of Transportation (Non-Medical): No  Physical Activity: Sufficiently Active (01/03/2018)   Exercise Vital Sign    Days of Exercise per Week: 5 days    Minutes of Exercise per Session: 50 min  Stress: No Stress Concern Present (01/03/2018)   [employer] of Occupational Health - Occupational Stress Questionnaire    Feeling of Stress : Only a little  Social Connections: Socially Integrated (01/03/2018)   Social Connection and Isolation Panel [NHANES]    Frequency of Communication with Friends and Family: Twice a week    Frequency of Social Gatherings with Friends and Family: Twice a week    Attends Religious Services: More than 4 times per year    Active Member of [employer] or Organizations: Yes    Attends Engineer, structural: More than 4 times per year    Marital Status: Married  Catering manager Violence: Not At Risk (02/24/2022)   Humiliation, Afraid, Rape, and Kick questionnaire    Fear of Current or Ex-Partner: No    Emotionally Abused: No    Physically Abused: No    Sexually Abused: No     Constitutional: Patient reports intermittent headaches.  Denies fever, malaise, fatigue, or abrupt weight changes.  HEENT: Denies eye pain, eye redness, ear pain, ringing in the ears, wax buildup, runny nose, nasal congestion, bloody nose, or sore throat. Respiratory: Denies difficulty breathing, shortness of breath, cough or sputum  production.   Cardiovascular: Denies chest pain, chest tightness, palpitations or swelling in the hands or feet.  Gastrointestinal: Pt reports constipation. Denies abdominal pain, bloating, constipation, diarrhea or blood in the stool.  GU: Patient reports erectile dysfunction.  Denies urgency, frequency, pain with urination, burning sensation, blood in urine, odor or discharge. Musculoskeletal: Patient reports chronic right shoulder pain.  Denies decrease in range of motion, difficulty with gait,  muscle pain or joint swelling.  Skin: Denies redness, rashes, lesions or ulcercations.  Neurological: Pt reports paresthesia of lower extremities. Denies dizziness, difficulty with memory, difficulty with speech or problems with balance and coordination.  Psych: Denies anxiety, depression, SI/HI.  No other specific complaints in a complete review of systems (except as listed in HPI above).  Objective:   Physical Exam   BP 120/80   Ht 6' (1.829 m)   Wt 210 lb (95.3 kg)   BMI 28.48 kg/m   Wt Readings from Last 3 Encounters:  03/16/22 217 lb 3.2 oz (98.5 kg)  02/24/22 219 lb 9.3 oz (99.6 kg)  01/22/22 220 lb (99.8 kg)    General: Appears his stated age, overweight, in NAD. Skin: Warm, dry and intact. No  ulcerations noted. HEENT: Head: normal shape and size; Eyes: sclera white, no icterus, conjunctiva pink, PERRLA and EOMs intact;  Neck:  Neck supple, trachea midline. No masses, lumps or thyromegaly present.  Cardiovascular: Normal rate and rhythm. S1,S2 noted.  No murmur, rubs or gallops noted. No JVD or BLE edema. No carotid bruits noted. Pulmonary/Chest: Normal effort and positive vesicular breath sounds. No respiratory distress. No wheezes, rales or ronchi noted.  Abdomen: Soft and nontender. Normal bowel sounds.  Musculoskeletal: Strength 5/5 BUE/BLE. No difficulty with gait.  Neurological: Alert and oriented. Cranial nerves II-XII grossly intact. Coordination normal.  Psychiatric:  Mood and affect normal. Behavior is normal. Judgment and thought content normal.    BMET    Component Value Date/Time   NA 140 02/25/2022 0538   K 3.7 02/25/2022 0538   CL 113 (H) 02/25/2022 0538   CO2 20 (L) 02/25/2022 0538   GLUCOSE 151 (H) 02/25/2022 0538   BUN 12 02/25/2022 0538   CREATININE 0.61 02/25/2022 0538   CREATININE 0.78 01/22/2022 0821   CALCIUM 7.8 (L) 02/25/2022 0538   GFRNONAA >60 02/25/2022 0538   GFRNONAA 119 05/10/2019 0839   GFRAA 138 05/10/2019 0839    Lipid Panel     Component Value Date/Time   CHOL 211 (H) 01/22/2022 0821   TRIG 160 (H) 01/22/2022 0821   HDL 53 01/22/2022 0821   CHOLHDL 4.0 01/22/2022 0821   LDLCALC 130 (H) 01/22/2022 0821    CBC    Component Value Date/Time   WBC 12.8 (H) 02/25/2022 0538   RBC 4.85 02/25/2022 0538   HGB 13.8 02/25/2022 0538   HCT 42.8 02/25/2022 0538   PLT 183 02/25/2022 0538   MCV 88.2 02/25/2022 0538   MCH 28.5 02/25/2022 0538   MCHC 32.2 02/25/2022 0538   RDW 12.8 02/25/2022 0538   LYMPHSABS 1,489 05/10/2019 0839   MONOABS 0.4 11/17/2017 0957   EOSABS 58 05/10/2019 0839   BASOSABS 32 05/10/2019 0839    Hgb A1C Lab Results  Component Value Date   HGBA1C 7.4 (H) 01/22/2022           Assessment & Plan:   Preventative health maintenance:  Flu shot today Tetanus UTD Encouraged him to get his COVID booster Pneumovax declined Discussed Shingrix vaccine, he will check coverage with his insurance company and schedule visit he would like to have this done Colon screening UTD Encouraged him to consume a balanced diet and exercise regimen Advised him to see an eye doctor and dentist annually Recent labs reviewed in care everywhere, PSA today  RTC in 6 months, follow-up chronic conditions Nicki Reaper, NP

## 2022-12-08 NOTE — Assessment & Plan Note (Signed)
Encouraged diet and exercise for weight loss ?

## 2022-12-08 NOTE — Assessment & Plan Note (Signed)
Will increase atorvastatin to 20 mg 3 times weekly, take 10 mg every other day Encourage low-fat diet

## 2022-12-08 NOTE — Patient Instructions (Signed)
Health Maintenance, Male Adopting a healthy lifestyle and getting preventive care are important in promoting health and wellness. Ask your health care provider about: The right schedule for you to have regular tests and exams. Things you can do on your own to prevent diseases and keep yourself healthy. What should I know about diet, weight, and exercise? Eat a healthy diet  Eat a diet that includes plenty of vegetables, fruits, low-fat dairy products, and lean protein. Do not eat a lot of foods that are high in solid fats, added sugars, or sodium. Maintain a healthy weight Body mass index (BMI) is a measurement that can be used to identify possible weight problems. It estimates body fat based on height and weight. Your health care provider can help determine your BMI and help you achieve or maintain a healthy weight. Get regular exercise Get regular exercise. This is one of the most important things you can do for your health. Most adults should: Exercise for at least 150 minutes each week. The exercise should increase your heart rate and make you sweat (moderate-intensity exercise). Do strengthening exercises at least twice a week. This is in addition to the moderate-intensity exercise. Spend less time sitting. Even light physical activity can be beneficial. Watch cholesterol and blood lipids Have your blood tested for lipids and cholesterol at 52 years of age, then have this test every 5 years. You may need to have your cholesterol levels checked more often if: Your lipid or cholesterol levels are high. You are older than 52 years of age. You are at high risk for heart disease. What should I know about cancer screening? Many types of cancers can be detected early and may often be prevented. Depending on your health history and family history, you may need to have cancer screening at various ages. This may include screening for: Colorectal cancer. Prostate cancer. Skin cancer. Lung  cancer. What should I know about heart disease, diabetes, and high blood pressure? Blood pressure and heart disease High blood pressure causes heart disease and increases the risk of stroke. This is more likely to develop in people who have high blood pressure readings or are overweight. Talk with your health care provider about your target blood pressure readings. Have your blood pressure checked: Every 3-5 years if you are 18-39 years of age. Every year if you are 40 years old or older. If you are between the ages of 65 and 75 and are a current or former smoker, ask your health care provider if you should have a one-time screening for abdominal aortic aneurysm (AAA). Diabetes Have regular diabetes screenings. This checks your fasting blood sugar level. Have the screening done: Once every three years after age 45 if you are at a normal weight and have a low risk for diabetes. More often and at a younger age if you are overweight or have a high risk for diabetes. What should I know about preventing infection? Hepatitis B If you have a higher risk for hepatitis B, you should be screened for this virus. Talk with your health care provider to find out if you are at risk for hepatitis B infection. Hepatitis C Blood testing is recommended for: Everyone born from 1945 through 1965. Anyone with known risk factors for hepatitis C. Sexually transmitted infections (STIs) You should be screened each year for STIs, including gonorrhea and chlamydia, if: You are sexually active and are younger than 52 years of age. You are older than 52 years of age and your   health care provider tells you that you are at risk for this type of infection. Your sexual activity has changed since you were last screened, and you are at increased risk for chlamydia or gonorrhea. Ask your health care provider if you are at risk. Ask your health care provider about whether you are at high risk for HIV. Your health care provider  may recommend a prescription medicine to help prevent HIV infection. If you choose to take medicine to prevent HIV, you should first get tested for HIV. You should then be tested every 3 months for as long as you are taking the medicine. Follow these instructions at home: Alcohol use Do not drink alcohol if your health care provider tells you not to drink. If you drink alcohol: Limit how much you have to 0-2 drinks a day. Know how much alcohol is in your drink. In the [place] one drink equals one 12 oz bottle of beer (355 mL), one 5 oz glass of wine (148 mL), or one 1 oz glass of hard liquor (44 mL). Lifestyle Do not use any products that contain nicotine or tobacco. These products include cigarettes, chewing tobacco, and vaping devices, such as e-cigarettes. If you need help quitting, ask your health care provider. Do not use street drugs. Do not share needles. Ask your health care provider for help if you need support or information about quitting drugs. General instructions Schedule regular health, dental, and eye exams. Stay current with your vaccines. Tell your health care provider if: You often feel depressed. You have ever been abused or do not feel safe at home. Summary Adopting a healthy lifestyle and getting preventive care are important in promoting health and wellness. Follow your health care provider's instructions about healthy diet, exercising, and getting tested or screened for diseases. Follow your health care provider's instructions on monitoring your cholesterol and blood pressure. This information is not intended to replace advice given to you by your health care provider. Make sure you discuss any questions you have with your health care provider. Document Revised: 06/16/2020 Document Reviewed: 06/16/2020 Elsevier Patient Education  2024 Elsevier Inc.  

## 2022-12-09 LAB — PSA: PSA: 0.56 ng/mL (ref <=4.00)

## 2022-12-14 ENCOUNTER — Other Ambulatory Visit: Payer: Self-pay | Admitting: Internal Medicine

## 2022-12-15 NOTE — Telephone Encounter (Signed)
Requested Prescriptions  Pending Prescriptions Disp Refills   JARDIANCE 25 MG TABS tablet [Pharmacy Med Name: JARDIANCE 25 MG TABLET] 30 tablet 0    Sig: TAKE 1 TABLET (25 MG TOTAL) BY MOUTH DAILY.     Endocrinology:  Diabetes - SGLT2 Inhibitors Passed - 12/14/2022  1:33 AM      Passed - Cr in normal range and within 360 days    Creatinine  Date Value Ref Range Status  09/14/2022 0.8 0.6 - 1.3 Final   Creat  Date Value Ref Range Status  01/22/2022 0.78 0.70 - 1.30 mg/dL Final   Creatinine, Ser  Date Value Ref Range Status  02/25/2022 0.61 0.61 - 1.24 mg/dL Final   Creatinine, Urine  Date Value Ref Range Status  09/14/2022 116.5  Final         Passed - HBA1C is between 0 and 7.9 and within 180 days    Hemoglobin A1C  Date Value Ref Range Status  09/14/2022 7.9  Final         Passed - eGFR in normal range and within 360 days    GFR, Est African American  Date Value Ref Range Status  05/10/2019 138 > OR = 60 mL/min/1.96m2 Final   GFR, Est Non African American  Date Value Ref Range Status  05/10/2019 119 > OR = 60 mL/min/1.67m2 Final   GFR, Estimated  Date Value Ref Range Status  02/25/2022 >60 >60 mL/min Final    Comment:    (NOTE) Calculated using the CKD-EPI Creatinine Equation (2021)    eGFR  Date Value Ref Range Status  09/14/2022 107  Final         Passed - Valid encounter within last 6 months    Recent Outpatient Visits           1 week ago Encounter for general adult medical examination with abnormal findings   [facility] [place], Salvadore Oxford, NP   10 months ago Type 2 diabetes mellitus with hyperglycemia, without long-term current use of insulin [facility])   [place] [facility] [place], Salvadore Oxford, NP   1 year ago Encounter for general adult medical examination with abnormal findings   [place] [facility] [place], Salvadore Oxford, NP   1 year ago Sore throat   [place] [facility]  [place], [place] W, NP   1 year ago Type 2 diabetes mellitus with hyperglycemia, without long-term current use of insulin [facility])   [place] [facility] [place], Salvadore Oxford, NP       Future Appointments             In 5 months Baity, Salvadore Oxford, NP [place] [facility], [facility]

## 2023-01-16 ENCOUNTER — Other Ambulatory Visit: Payer: Self-pay | Admitting: Internal Medicine

## 2023-01-18 NOTE — Telephone Encounter (Signed)
Requested Prescriptions  Pending Prescriptions Disp Refills   empagliflozin (JARDIANCE) 25 MG TABS tablet [Pharmacy Med Name: JARDIANCE 25 MG TABLET] 90 tablet 0    Sig: TAKE 1 TABLET (25 MG TOTAL) BY MOUTH DAILY.     Endocrinology:  Diabetes - SGLT2 Inhibitors Passed - 01/16/2023  8:48 AM      Passed - Cr in normal range and within 360 days    Creatinine  Date Value Ref Range Status  09/14/2022 0.8 0.6 - 1.3 Final   Creat  Date Value Ref Range Status  01/22/2022 0.78 0.70 - 1.30 mg/dL Final   Creatinine, Ser  Date Value Ref Range Status  02/25/2022 0.61 0.61 - 1.24 mg/dL Final   Creatinine, Urine  Date Value Ref Range Status  09/14/2022 116.5  Final         Passed - HBA1C is between 0 and 7.9 and within 180 days    Hemoglobin A1C  Date Value Ref Range Status  09/14/2022 7.9  Final         Passed - eGFR in normal range and within 360 days    GFR, Est African American  Date Value Ref Range Status  05/10/2019 138 > OR = 60 mL/min/1.26m2 Final   GFR, Est Non African American  Date Value Ref Range Status  05/10/2019 119 > OR = 60 mL/min/1.26m2 Final   GFR, Estimated  Date Value Ref Range Status  02/25/2022 >60 >60 mL/min Final    Comment:    (NOTE) Calculated using the CKD-EPI Creatinine Equation (2021)    eGFR  Date Value Ref Range Status  09/14/2022 107  Final         Passed - Valid encounter within last 6 months    Recent Outpatient Visits           1 month ago Encounter for general adult medical examination with abnormal findings   [place] [facility] [place], Salvadore Oxford, NP   12 months ago Type 2 diabetes mellitus with hyperglycemia, without long-term current use of insulin [facility])   [facility] [place], Salvadore Oxford, NP   1 year ago Encounter for general adult medical examination with abnormal findings   [place] [facility] [place], Salvadore Oxford, NP   1 year ago Sore throat   [place] [facility] [place], [place] W, NP   1 year ago Type 2 diabetes mellitus with hyperglycemia, without long-term current use of insulin [facility])   [place] [facility] [place], Salvadore Oxford, NP       Future Appointments             In 4 months Baity, Salvadore Oxford, NP  [facility], [facility]

## 2023-02-08 ENCOUNTER — Other Ambulatory Visit: Payer: Self-pay | Admitting: Internal Medicine

## 2023-02-12 NOTE — Telephone Encounter (Signed)
 Requested Prescriptions  Pending Prescriptions Disp Refills   BD PEN NEEDLE NANO 2ND GEN 32G X 4 MM MISC [Pharmacy Med Name: BD NANO 2 GEN PEN NDL 32G 4MM] 200 each 3    Sig: BD PEN NEEDLES- BRAND SPECIFIC. INJECT INSULIN  VIA INSULIN  PEN 6 X DAILY     Endocrinology: Diabetes - Testing Supplies Passed - 02/12/2023 10:43 AM      Passed - Valid encounter within last 12 months    Recent Outpatient Visits           2 months ago Encounter for general adult medical examination with abnormal findings   [place] [facility] [place], [place] W, NP   1 year ago Type 2 diabetes mellitus with hyperglycemia, without long-term current use of insulin  [facility])   [place] [facility] [place], Angeline ORN, NP   1 year ago Encounter for general adult medical examination with abnormal findings   [place] [facility] [place], Angeline ORN, NP   1 year ago Sore throat   [place] [facility] [place], [place] W, NP   1 year ago Type 2 diabetes mellitus with hyperglycemia, without long-term current use of insulin  [facility])   [place] [facility] [place], Angeline ORN, NP       Future Appointments             In 3 months Baity, Angeline ORN, NP  [facility], [facility]

## 2023-03-24 LAB — CBC AND DIFFERENTIAL: Hemoglobin: 12 — AB (ref 13.5–17.5)

## 2023-03-24 LAB — BASIC METABOLIC PANEL WITH GFR: Creatinine: 0.8 (ref 0.6–1.3)

## 2023-03-24 LAB — LIPID PANEL
LDL Cholesterol: 179
Triglycerides: 148 (ref 40–160)

## 2023-03-24 LAB — COMPREHENSIVE METABOLIC PANEL WITH GFR: eGFR: 105

## 2023-03-25 LAB — LAB REPORT - SCANNED
A1c: 12
Albumin, Urine POC: 18.2
Albumin/Creatinine Ratio, Urine, POC: 16
Creatinine, POC: 116.5 mg/dL
EGFR: 105
TSH: 1.03 (ref 0.41–5.90)

## 2023-05-16 ENCOUNTER — Other Ambulatory Visit: Payer: Self-pay | Admitting: Internal Medicine

## 2023-05-17 NOTE — Telephone Encounter (Signed)
 Requested medication (s) are due for refill today: yes  Requested medication (s) are on the active medication list: yes  Last refill:  01/18/23 #90  Future visit scheduled: yes  Notes to clinic:  overdue lab work    Requested Prescriptions  Pending Prescriptions Disp Refills   JARDIANCE 25 MG TABS tablet [Pharmacy Med Name: JARDIANCE 25 MG TABLET] 30 tablet 2    Sig: TAKE 1 TABLET (25 MG TOTAL) BY MOUTH DAILY.     Endocrinology:  Diabetes - SGLT2 Inhibitors Failed - 05/17/2023 10:28 AM      Failed - HBA1C is between 0 and 7.9 and within 180 days    Hemoglobin A1C  Date Value Ref Range Status  09/14/2022 7.9  Final         Failed - Valid encounter within last 6 months    Recent Outpatient Visits   None     Future Appointments             In 3 weeks Baity, Salvadore Oxford, NP [place] [facility], [facility]            Passed - Cr in normal range and within 360 days    Creatinine  Date Value Ref Range Status  09/14/2022 0.8 0.6 - 1.3 Final   Creat  Date Value Ref Range Status  01/22/2022 0.78 0.70 - 1.30 mg/dL Final   Creatinine, Ser  Date Value Ref Range Status  02/25/2022 0.61 0.61 - 1.24 mg/dL Final   Creatinine, Urine  Date Value Ref Range Status  09/14/2022 116.5  Final         Passed - eGFR in normal range and within 360 days    GFR, Est African American  Date Value Ref Range Status  05/10/2019 138 > OR = 60 mL/min/1.47m2 Final   GFR, Est Non African American  Date Value Ref Range Status  05/10/2019 119 > OR = 60 mL/min/1.49m2 Final   GFR, Estimated  Date Value Ref Range Status  02/25/2022 >60 >60 mL/min Final    Comment:    (NOTE) Calculated using the CKD-EPI Creatinine Equation (2021)    eGFR  Date Value Ref Range Status  09/14/2022 107  Final

## 2023-05-31 LAB — HM DIABETES EYE EXAM

## 2023-06-10 ENCOUNTER — Encounter: Payer: Self-pay | Admitting: Internal Medicine

## 2023-06-10 ENCOUNTER — Ambulatory Visit: Payer: Self-pay | Admitting: Internal Medicine

## 2023-06-10 VITALS — BP 118/74 | Ht 72.0 in | Wt 212.8 lb

## 2023-06-10 DIAGNOSIS — I700 Atherosclerosis of aorta: Secondary | ICD-10-CM | POA: Diagnosis not present

## 2023-06-10 DIAGNOSIS — Z6828 Body mass index (BMI) 28.0-28.9, adult: Secondary | ICD-10-CM

## 2023-06-10 DIAGNOSIS — E1169 Type 2 diabetes mellitus with other specified complication: Secondary | ICD-10-CM

## 2023-06-10 DIAGNOSIS — Z794 Long term (current) use of insulin: Secondary | ICD-10-CM

## 2023-06-10 DIAGNOSIS — N5201 Erectile dysfunction due to arterial insufficiency: Secondary | ICD-10-CM | POA: Diagnosis not present

## 2023-06-10 DIAGNOSIS — E785 Hyperlipidemia, unspecified: Secondary | ICD-10-CM

## 2023-06-10 DIAGNOSIS — E119 Type 2 diabetes mellitus without complications: Secondary | ICD-10-CM

## 2023-06-10 DIAGNOSIS — R519 Headache, unspecified: Secondary | ICD-10-CM

## 2023-06-10 DIAGNOSIS — E1165 Type 2 diabetes mellitus with hyperglycemia: Secondary | ICD-10-CM | POA: Diagnosis not present

## 2023-06-10 DIAGNOSIS — M25511 Pain in right shoulder: Secondary | ICD-10-CM

## 2023-06-10 DIAGNOSIS — E663 Overweight: Secondary | ICD-10-CM

## 2023-06-10 DIAGNOSIS — G8929 Other chronic pain: Secondary | ICD-10-CM

## 2023-06-10 DIAGNOSIS — Z7984 Long term (current) use of oral hypoglycemic drugs: Secondary | ICD-10-CM

## 2023-06-10 NOTE — Assessment & Plan Note (Signed)
 Will request A1c and urine microalbumin from endocrinology He is not exactly sure which insulin  he is taking, advised him to send me a MyChart message and let me know so that we can update his medication record Continue jardiance  Encourage low-carb diet and exercise for weight loss Will request copy of eye exam Encouraged routine foot exam He declines Pneumovax

## 2023-06-10 NOTE — Assessment & Plan Note (Signed)
 Will request recent lipid profile from endocrinology Encouraged him to consume a low-fat diet Continue atorvastatin  and aspirin

## 2023-06-10 NOTE — Progress Notes (Signed)
 Subjective:    Patient ID: Bruce Brandt, male    DOB: Mar 01, 1970, 53 y.o.   MRN: [id number]  HPI  Patient presents to clinic today for 51-month follow-up of chronic conditions.  Frequent Headaches: These occur daily.  He is not sure what triggers this.  He takes imipramine , baclofen and zonegran as prescribed with some relief of symptoms.  He follows with neurology.  HLD with aortic atherosclerosis: His last LDL was 99, triglycerides 69, 09/2022.  He denies myalgias on atorvastatin . He is taking aspirin as well.  He does not consume a low-fat diet.  DM2: His last A1c was 7.9%, 09/2022.  He is not sure what insulin  he is taking but he is taking jardiance  as prescribed.  His sugars range 120-300.  He checks his feet routinely.  His last eye exam was 05/2023.  Flu 11/2022.  Pneumovax never.  COVID Pfizer x 2.  He follows with endocrinology.  Chronic shoulder pain: Managed with naproxen as needed. He does not follow with orthopedics.  ED: He is not taking any medications for this. He does not follow with urology.  Review of Systems     Past Medical History:  Diagnosis Date   Carpal tunnel syndrome, bilateral 05/18/2016   Diabetes (HCC)    Frequent headaches    Gingivitis     Current Outpatient Medications  Medication Sig Dispense Refill   aspirin EC 81 MG tablet Take 81 mg by mouth daily.     atorvastatin  (LIPITOR) 20 MG tablet Take 1 tablet (20 mg total) by mouth daily. 90 tablet 1   baclofen (LIORESAL) 10 MG tablet Take 10 mg by mouth daily as needed (Headache).     cetirizine (ZYRTEC) 10 MG tablet Take 10 mg by mouth daily.     Cholecalciferol (VITAMIN D-3) 125 MCG (5000 UT) TABS Take 2 capsules by mouth daily.     Co-Enzyme Q-10 30 MG CAPS Take 30 mg by mouth daily.     Continuous Blood Gluc Sensor (FREESTYLE LIBRE 3 SENSOR) MISC 1 Device by Does not apply route every 14 (fourteen) days. Place 1 sensor on the skin every 14 days. Use to check glucose continuously 6 each 1    Dulaglutide  (TRULICITY ) 3 MG/0.5ML SOPN Inject 3 mg as directed once a week. 6 mL 0   glipiZIDE  (GLUCOTROL ) 10 MG tablet TAKE 1 TABLET (10 MG TOTAL) BY MOUTH TWICE A DAY BEFORE A MEAL 180 tablet 0   glucose blood (ONETOUCH ULTRA) test strip Check blood sugar up to three times a day 100 each 12   HUMALOG MIX 75/25 KWIKPEN (75-25) 100 UNIT/ML KwikPen Inject 10-14 Units into the skin 2 (two) times daily before a meal.     hydrocortisone  2.5 % lotion Apply topically 2 (two) times daily. 59 mL 0   ibuprofen  (ADVIL ) 600 MG tablet Take 1 tablet (600 mg total) by mouth every 6 (six) hours as needed. 30 tablet 0   imipramine  (TOFRANIL ) 50 MG tablet Take 100 mg by mouth daily.     Insulin  Pen Needle (BD PEN NEEDLE NANO 2ND GEN) 32G X 4 MM MISC BD PEN NEEDLES- BRAND SPECIFIC. INJECT INSULIN  VIA INSULIN  PEN 6 X DAILY 300 each 3   JARDIANCE  25 MG TABS tablet TAKE 1 TABLET (25 MG TOTAL) BY MOUTH DAILY. 30 tablet 2   Magnesium 100 MG TABS Take 200 mg by mouth daily.     multivitamin-iron-minerals-folic acid (CENTRUM) chewable tablet Chew 1 tablet by mouth daily.  Naproxen Sod-diphenhydrAMINE  220-25 MG TABS Take 2 tablets by mouth at bedtime as needed.     TRESIBA FLEXTOUCH 100 UNIT/ML FlexTouch Pen Inject 12 Units into the skin daily.     Turmeric 400 MG CAPS Take 400 mg by mouth daily.     zonisamide (ZONEGRAN) 100 MG capsule Take 300 mg by mouth daily.     No current facility-administered medications for this visit.    Allergies  Allergen Reactions   Metformin  Hcl Other (See Comments)    GI upset   Covid-19 (Mrna) Vaccine Proofreader) [Covid-19 (Mrna) Vaccine]     Bruising under great toe on both feet 1 week s/p Moderna Immunization    Family History  Problem Relation Age of Onset   Heart disease Father    Diabetes Maternal Grandmother    Diabetes Paternal Grandmother     Social History   Socioeconomic History   Marital status: Married    Spouse name: Not on file   Number of children: 2    Years of education: 12   Highest education level: High school graduate  Occupational History   Occupation: Music therapist    Comment: Self-employed  Tobacco Use   Smoking status: Never   Smokeless tobacco: Never  Vaping Use   Vaping status: Never Used  Substance and Sexual Activity   Alcohol use: Yes    Comment: > 2 years ago had rare alcohol intake   Drug use: No   Sexual activity: Yes    Partners: Female    Comment: Married  Other Topics Concern   Not on file  Social History Narrative   Lives at home w/ his wife   Right-handed   Caffeine: three 16 oz drinks daily   Social Drivers of Corporate investment banker Strain: Low Risk  (01/03/2018)   Overall Financial Resource Strain (CARDIA)    Difficulty of Paying Living Expenses: Not hard at all  Food Insecurity: No Food Insecurity (02/24/2022)   Hunger Vital Sign    Worried About Running Out of Food in the Last Year: Never true    Ran Out of Food in the Last Year: Never true  Transportation Needs: No Transportation Needs (02/24/2022)   PRAPARE - Administrator, Civil Service (Medical): No    Lack of Transportation (Non-Medical): No  Physical Activity: Sufficiently Active (01/03/2018)   Exercise Vital Sign    Days of Exercise per Week: 5 days    Minutes of Exercise per Session: 50 min  Stress: No Stress Concern Present (01/03/2018)   [employer] of Occupational Health - Occupational Stress Questionnaire    Feeling of Stress : Only a little  Social Connections: Socially Integrated (01/03/2018)   Social Connection and Isolation Panel [NHANES]    Frequency of Communication with Friends and Family: Twice a week    Frequency of Social Gatherings with Friends and Family: Twice a week    Attends Religious Services: More than 4 times per year    Active Member of [employer] or Organizations: Yes    Attends Engineer, structural: More than 4 times per year    Marital Status: Married  Catering manager Violence: Not  At Risk (02/24/2022)   Humiliation, Afraid, Rape, and Kick questionnaire    Fear of Current or Ex-Partner: No    Emotionally Abused: No    Physically Abused: No    Sexually Abused: No     Constitutional: Denies fever, malaise, fatigue, headache or abrupt weight changes.  HEENT: Denies  eye pain, eye redness, ear pain, ringing in the ears, wax buildup, runny nose, nasal congestion, bloody nose, or sore throat. Respiratory: Denies difficulty breathing, shortness of breath, cough or sputum production.   Cardiovascular: Denies chest pain, chest tightness, palpitations or swelling in the hands or feet.  Gastrointestinal:  Denies abdominal pain, bloating, constipation, diarrhea or blood in the stool.  GU: Pt reports erectile dysfunction. Denies urgency, frequency, pain with urination, burning sensation, blood in urine, odor or discharge. Musculoskeletal: Pt reports chronic right shoulder pain. Denies decrease in range of motion, difficulty with gait, muscle pain or joint pain and swelling.  Skin: Denies redness, rashes, lesions or ulcercations.  Neurological: Pt reports paresthesia of BUE. Denies dizziness, difficulty with memory, difficulty with speech or problems with balance and coordination.  Psych: Denies anxiety, depression, SI/HI.  No other specific complaints in a complete review of systems (except as listed in HPI above).  Objective:   Physical Exam  BP 118/74 (BP Location: Left Arm, Patient Position: Sitting, Cuff Size: Normal)   Ht 6' (1.829 m)   Wt 212 lb 12.8 oz (96.5 kg)   BMI 28.86 kg/m    Wt Readings from Last 3 Encounters:  12/08/22 210 lb (95.3 kg)  03/16/22 217 lb 3.2 oz (98.5 kg)  02/24/22 219 lb 9.3 oz (99.6 kg)    General: Appears his stated age, overweight in NAD. Skin: Warm, dry and intact. No ulcerations noted but he does have hyperpigmentation noted of BLE. HEENT: Head: normal shape and size; Eyes: sclera white, no icterus, conjunctiva pink, PERRLA and EOMs  intact; Cardiovascular: Tachycardic with normal rhythm. S1,S2 noted.  No murmur, rubs or gallops noted. No JVD or BLE edema. No carotid bruits noted. Pulmonary/Chest: Normal effort and positive vesicular breath sounds. No respiratory distress. No wheezes, rales or ronchi noted.  Abdomen: Normal bowel sounds. Musculoskeletal: No difficulty with gait.  Neurological: Alert and oriented. Coordination normal.     BMET    Component Value Date/Time   NA 140 02/25/2022 0538   K 3.7 02/25/2022 0538   CL 113 (H) 02/25/2022 0538   CO2 20 (L) 02/25/2022 0538   GLUCOSE 151 (H) 02/25/2022 0538   BUN 12 02/25/2022 0538   CREATININE 0.8 09/14/2022 0000   CREATININE 0.61 02/25/2022 0538   CREATININE 0.78 01/22/2022 0821   CALCIUM  7.8 (L) 02/25/2022 0538   GFRNONAA >60 02/25/2022 0538   GFRNONAA 119 05/10/2019 0839   GFRAA 138 05/10/2019 0839    Lipid Panel     Component Value Date/Time   CHOL 165 09/14/2022 0000   TRIG 69 09/14/2022 0000   HDL 53 01/22/2022 0821   CHOLHDL 4.0 01/22/2022 0821   LDLCALC 99 09/14/2022 0000   LDLCALC 130 (H) 01/22/2022 0821    CBC    Component Value Date/Time   WBC 12.8 (H) 02/25/2022 0538   RBC 4.85 02/25/2022 0538   HGB 13.8 02/25/2022 0538   HCT 42.8 02/25/2022 0538   PLT 183 02/25/2022 0538   MCV 88.2 02/25/2022 0538   MCH 28.5 02/25/2022 0538   MCHC 32.2 02/25/2022 0538   RDW 12.8 02/25/2022 0538   LYMPHSABS 1,489 05/10/2019 0839   MONOABS 0.4 11/17/2017 0957   EOSABS 58 05/10/2019 0839   BASOSABS 32 05/10/2019 0839    Hgb A1C Lab Results  Component Value Date   HGBA1C 7.9 09/14/2022           Assessment & Plan:     RTC in 6 months for your annual  exam Helayne Lo, NP

## 2023-06-10 NOTE — Assessment & Plan Note (Signed)
 Encouraged diet and exercise for weight loss ?

## 2023-06-10 NOTE — Patient Instructions (Signed)

## 2023-06-10 NOTE — Assessment & Plan Note (Signed)
Encouraged shoulder exercises Continue naproxen as needed

## 2023-06-10 NOTE — Assessment & Plan Note (Signed)
Currently not taking any medications for this He does not follow with urology

## 2023-06-10 NOTE — Assessment & Plan Note (Signed)
 Continue zonegran, baclofen and imipramine  as prescribed He will continue to follow with neurology

## 2023-06-10 NOTE — Assessment & Plan Note (Signed)
 Will request records from endocrinology for recent labs  Continue atorvastatin   Encourage low-fat diet

## 2023-06-14 ENCOUNTER — Encounter: Payer: Self-pay | Admitting: Internal Medicine

## 2023-06-21 LAB — OPHTHALMOLOGY REPORT-SCANNED

## 2023-07-19 LAB — OPHTHALMOLOGY REPORT-SCANNED

## 2023-08-13 ENCOUNTER — Other Ambulatory Visit: Payer: Self-pay | Admitting: Internal Medicine

## 2023-08-16 NOTE — Telephone Encounter (Signed)
 Requested Prescriptions  Pending Prescriptions Disp Refills   atorvastatin  (LIPITOR) 20 MG tablet [Pharmacy Med Name: ATORVASTATIN  20 MG TABLET] 30 tablet 5    Sig: TAKE 1 TABLET BY MOUTH EVERY DAY     Cardiovascular:  Antilipid - Statins Failed - 08/16/2023 12:24 PM      Failed - Lipid Panel in normal range within the last 12 months    Cholesterol  Date Value Ref Range Status  09/14/2022 165 0 - 200 Final   LDL Cholesterol (Calc)  Date Value Ref Range Status  01/22/2022 130 (H) mg/dL (calc) Final    Comment:    Reference range: <100 . Desirable range <100 mg/dL for primary prevention;   <70 mg/dL for patients with CHD or diabetic patients  with > or = 2 CHD risk factors. SABRA LDL-C is now calculated using the Martin-Hopkins  calculation, which is a validated novel method providing  better accuracy than the Friedewald equation in the  estimation of LDL-C.  Gladis APPLETHWAITE et al. SANDREA. 7986;689(80): 2061-2068  (http://education.[facility].com/faq/FAQ164)    LDL Cholesterol  Date Value Ref Range Status  03/24/2023 179  Final    Comment:    [facility]   HDL  Date Value Ref Range Status  01/22/2022 53 > OR = 40 mg/dL Final   Triglycerides  Date Value Ref Range Status  03/24/2023 148 40 - 160 Final    Comment:    [facility] - Patient is not pregnant      Passed - Valid encounter within last 12 months    Recent Outpatient Visits           2 months ago Aortic atherosclerosis (HCC)   [place] [facility] [place], [place], NP               JARDIANCE  25 MG TABS tablet [Pharmacy Med Name: JARDIANCE  25 MG TABLET] 30 tablet 2    Sig: TAKE 1 TABLET (25 MG TOTAL) BY MOUTH DAILY.     Endocrinology:  Diabetes - SGLT2 Inhibitors Failed - 08/16/2023 12:24 PM      Failed - HBA1C is between 0 and 7.9 and within 180 days    Hemoglobin A1C  Date Value Ref Range Status  09/14/2022 7.9  Final   A1c  Date Value Ref Range Status   03/25/2023 12.0  Final         Passed - Cr in normal range and within 360 days    Creatinine  Date Value Ref Range Status  03/24/2023 0.8 0.6 - 1.3 Final    Comment:    [facility]   Creat  Date Value Ref Range Status  01/22/2022 0.78 0.70 - 1.30 mg/dL Final   Creatinine, Ser  Date Value Ref Range Status  02/25/2022 0.61 0.61 - 1.24 mg/dL Final   Creatinine, POC  Date Value Ref Range Status  03/25/2023 116.5 mg/dL Final   Creatinine, Urine  Date Value Ref Range Status  09/15/2022 116.5  Final    Comment:    [facility] - eGFR in normal range and within 360 days    GFR, Est African American  Date Value Ref Range Status  05/10/2019 138 > OR = 60 mL/min/1.87m2 Final   GFR, Est Non African American  Date Value Ref Range Status  05/10/2019 119 > OR = 60 mL/min/1.73m2 Final   GFR, Estimated  Date Value  Ref Range Status  02/25/2022 >60 >60 mL/min Final    Comment:    (NOTE) Calculated using the CKD-EPI Creatinine Equation (2021)    eGFR  Date Value Ref Range Status  03/24/2023 105  Final    Comment:    [facility]   EGFR  Date Value Ref Range Status  03/25/2023 105.0  Final    Comment:    Abstracted by HIM         Passed - Valid encounter within last 6 months    Recent Outpatient Visits           2 months ago Aortic atherosclerosis [facility])   [place] [facility] [place], [place], [place]

## 2023-11-14 ENCOUNTER — Other Ambulatory Visit: Payer: Self-pay | Admitting: Internal Medicine

## 2023-11-15 NOTE — Telephone Encounter (Signed)
 Requested medications are due for refill today.  yes  Requested medications are on the active medications list.  yes  Last refill. 08/16/2023 #30 2 rf  Future visit scheduled.   yes  Notes to clinic.  Labs are expired.    Requested Prescriptions  Pending Prescriptions Disp Refills   JARDIANCE  25 MG TABS tablet [Pharmacy Med Name: JARDIANCE  25 MG TABLET] 30 tablet 2    Sig: TAKE 1 TABLET (25 MG TOTAL) BY MOUTH DAILY.     Endocrinology:  Diabetes - SGLT2 Inhibitors Failed - 11/15/2023 12:41 PM      Failed - HBA1C is between 0 and 7.9 and within 180 days    Hemoglobin A1C  Date Value Ref Range Status  09/14/2022 7.9  Final   A1c  Date Value Ref Range Status  03/25/2023 12.0  Final         Passed - Cr in normal range and within 360 days    Creatinine  Date Value Ref Range Status  03/24/2023 0.8 0.6 - 1.3 Final    Comment:    [facility]   Creat  Date Value Ref Range Status  01/22/2022 0.78 0.70 - 1.30 mg/dL Final   Creatinine, Ser  Date Value Ref Range Status  02/25/2022 0.61 0.61 - 1.24 mg/dL Final   Creatinine, POC  Date Value Ref Range Status  03/25/2023 116.5 mg/dL Final   Creatinine, Urine  Date Value Ref Range Status  09/15/2022 116.5  Final    Comment:    [facility] - eGFR in normal range and within 360 days    GFR, Est African American  Date Value Ref Range Status  05/10/2019 138 > OR = 60 mL/min/1.42m2 Final   GFR, Est Non African American  Date Value Ref Range Status  05/10/2019 119 > OR = 60 mL/min/1.16m2 Final   GFR, Estimated  Date Value Ref Range Status  02/25/2022 >60 >60 mL/min Final    Comment:    (NOTE) Calculated using the CKD-EPI Creatinine Equation (2021)    eGFR  Date Value Ref Range Status  03/24/2023 105  Final    Comment:    [facility]   EGFR  Date Value Ref Range Status  03/25/2023 105.0  Final    Comment:    Abstracted by HIM         Passed - Valid encounter within last 6  months    Recent Outpatient Visits           5 months ago Aortic atherosclerosis   [place] [facility] [place], [place], NP

## 2023-11-19 NOTE — ED Triage Notes (Signed)
 Has not had a BM in 3 days. Today began having vomiting and lumbar back pain. Message was sent to PCP whom instructed patient to do 2 doses of 17g Miralax. Patient states that he took both doses and immediately vomited afterwards. He is passing flatus without difficulty. No abdominal pain throughout the last 3 days.  HR 105, all other vitals WNL. No distress currently in triage.

## 2023-11-20 ENCOUNTER — Emergency Department

## 2023-11-20 ENCOUNTER — Inpatient Hospital Stay
Admission: EM | Admit: 2023-11-20 | Discharge: 2023-11-24 | DRG: 638 | Disposition: A | Discharge status: Home / Self Care | Attending: Student in an Organized Health Care Education/Training Program | Admitting: Student in an Organized Health Care Education/Training Program

## 2023-11-20 ENCOUNTER — Other Ambulatory Visit: Payer: Self-pay

## 2023-11-20 DIAGNOSIS — K5900 Constipation, unspecified: Secondary | ICD-10-CM | POA: Diagnosis present

## 2023-11-20 DIAGNOSIS — D3500 Benign neoplasm of unspecified adrenal gland: Secondary | ICD-10-CM | POA: Diagnosis present

## 2023-11-20 DIAGNOSIS — E1110 Type 2 diabetes mellitus with ketoacidosis without coma: Secondary | ICD-10-CM | POA: Diagnosis present

## 2023-11-20 DIAGNOSIS — R112 Nausea with vomiting, unspecified: Secondary | ICD-10-CM | POA: Diagnosis present

## 2023-11-20 DIAGNOSIS — Z887 Allergy status to serum and vaccine status: Secondary | ICD-10-CM

## 2023-11-20 DIAGNOSIS — G5603 Carpal tunnel syndrome, bilateral upper limbs: Secondary | ICD-10-CM | POA: Diagnosis present

## 2023-11-20 DIAGNOSIS — Z7982 Long term (current) use of aspirin: Secondary | ICD-10-CM

## 2023-11-20 DIAGNOSIS — G43909 Migraine, unspecified, not intractable, without status migrainosus: Secondary | ICD-10-CM | POA: Diagnosis present

## 2023-11-20 DIAGNOSIS — D72829 Elevated white blood cell count, unspecified: Secondary | ICD-10-CM | POA: Diagnosis present

## 2023-11-20 DIAGNOSIS — Z833 Family history of diabetes mellitus: Secondary | ICD-10-CM

## 2023-11-20 DIAGNOSIS — M545 Low back pain, unspecified: Secondary | ICD-10-CM | POA: Diagnosis present

## 2023-11-20 DIAGNOSIS — R911 Solitary pulmonary nodule: Secondary | ICD-10-CM | POA: Diagnosis present

## 2023-11-20 DIAGNOSIS — Z79899 Other long term (current) drug therapy: Secondary | ICD-10-CM | POA: Diagnosis not present

## 2023-11-20 DIAGNOSIS — Z794 Long term (current) use of insulin: Secondary | ICD-10-CM

## 2023-11-20 DIAGNOSIS — Z9049 Acquired absence of other specified parts of digestive tract: Secondary | ICD-10-CM

## 2023-11-20 DIAGNOSIS — Z888 Allergy status to other drugs, medicaments and biological substances status: Secondary | ICD-10-CM

## 2023-11-20 DIAGNOSIS — E785 Hyperlipidemia, unspecified: Secondary | ICD-10-CM | POA: Diagnosis present

## 2023-11-20 DIAGNOSIS — N179 Acute kidney failure, unspecified: Secondary | ICD-10-CM | POA: Diagnosis present

## 2023-11-20 DIAGNOSIS — Z7984 Long term (current) use of oral hypoglycemic drugs: Secondary | ICD-10-CM

## 2023-11-20 DIAGNOSIS — Z8249 Family history of ischemic heart disease and other diseases of the circulatory system: Secondary | ICD-10-CM

## 2023-11-20 DIAGNOSIS — E1165 Type 2 diabetes mellitus with hyperglycemia: Secondary | ICD-10-CM | POA: Diagnosis present

## 2023-11-20 LAB — BLOOD GAS, VENOUS
Acid-base deficit: 25.5 mmol/L — ABNORMAL HIGH (ref 0.0–2.0)
Acid-base deficit: 14.9 mmol/L — ABNORMAL HIGH (ref 0.0–2.0)
Bicarbonate: 4.2 mmol/L — ABNORMAL LOW (ref 20.0–28.0)
Bicarbonate: 10.7 mmol/L — ABNORMAL LOW (ref 20.0–28.0)
O2 Saturation: 83.7 %
O2 Saturation: 88.8 %
Patient temperature: 37
Patient temperature: 37
pCO2, Ven: <18 mmHg — CL (ref 44–60)
pCO2, Ven: 25 mmHg — ABNORMAL LOW (ref 44–60)
pH, Ven: 7 — CL (ref 7.25–7.43)
pH, Ven: 7.24 — ABNORMAL LOW (ref 7.25–7.43)
pO2, Ven: 47 mmHg — ABNORMAL HIGH (ref 32–45)
pO2, Ven: 50 mmHg — ABNORMAL HIGH (ref 32–45)

## 2023-11-20 LAB — CBC WITH DIFFERENTIAL/PLATELET
Abs Immature Granulocytes: 0.14 K/uL — ABNORMAL HIGH (ref 0.00–0.07)
Basophils Absolute: 0.1 K/uL (ref 0.0–0.1)
Basophils Relative: 0 %
Eosinophils Absolute: 0 K/uL (ref 0.0–0.5)
Eosinophils Relative: 0 %
HCT: 54.7 % — ABNORMAL HIGH (ref 39.0–52.0)
Hemoglobin: 17 g/dL (ref 13.0–17.0)
Immature Granulocytes: 1 %
Lymphocytes Relative: 6 %
Lymphs Abs: 1.2 K/uL (ref 0.7–4.0)
MCH: 29.3 pg (ref 26.0–34.0)
MCHC: 31.1 g/dL (ref 30.0–36.0)
MCV: 94.1 fL (ref 80.0–100.0)
Monocytes Absolute: 1.3 K/uL — ABNORMAL HIGH (ref 0.1–1.0)
Monocytes Relative: 7 %
Neutro Abs: 16.5 K/uL — ABNORMAL HIGH (ref 1.7–7.7)
Neutrophils Relative %: 86 %
Platelets: 242 K/uL (ref 150–400)
RBC: 5.81 MIL/uL (ref 4.22–5.81)
RDW: 12.8 % (ref 11.5–15.5)
WBC: 19.3 K/uL — ABNORMAL HIGH (ref 4.0–10.5)
nRBC: 0 % (ref 0.0–0.2)

## 2023-11-20 LAB — URINALYSIS, ROUTINE W REFLEX MICROSCOPIC
Bacteria, UA: NONE SEEN
Bilirubin Urine: NEGATIVE
Glucose, UA: >=500 mg/dL — AB
Ketones, ur: 80 mg/dL — AB
Leukocytes,Ua: NEGATIVE
Nitrite: NEGATIVE
Protein, ur: 100 mg/dL — AB
Specific Gravity, Urine: 1.022 (ref 1.005–1.030)
pH: 5 (ref 5.0–8.0)

## 2023-11-20 LAB — BASIC METABOLIC PANEL WITH GFR
Anion gap: 26 — ABNORMAL HIGH (ref 5–15)
Anion gap: 13 (ref 5–15)
Anion gap: 15 (ref 5–15)
Anion gap: 11 (ref 5–15)
BUN: 29 mg/dL — ABNORMAL HIGH (ref 6–20)
BUN: 19 mg/dL (ref 6–20)
BUN: 17 mg/dL (ref 6–20)
BUN: 14 mg/dL (ref 6–20)
CO2: 8 mmol/L — ABNORMAL LOW (ref 22–32)
CO2: 12 mmol/L — ABNORMAL LOW (ref 22–32)
CO2: 14 mmol/L — ABNORMAL LOW (ref 22–32)
CO2: 14 mmol/L — ABNORMAL LOW (ref 22–32)
Calcium: 8.4 mg/dL — ABNORMAL LOW (ref 8.9–10.3)
Calcium: 8.4 mg/dL — ABNORMAL LOW (ref 8.9–10.3)
Calcium: 8.5 mg/dL — ABNORMAL LOW (ref 8.9–10.3)
Calcium: 8.4 mg/dL — ABNORMAL LOW (ref 8.9–10.3)
Chloride: 108 mmol/L (ref 98–111)
Chloride: 109 mmol/L (ref 98–111)
Chloride: 110 mmol/L (ref 98–111)
Chloride: 112 mmol/L — ABNORMAL HIGH (ref 98–111)
Creatinine, Ser: 1.08 mg/dL (ref 0.61–1.24)
Creatinine, Ser: 1.17 mg/dL (ref 0.61–1.24)
Creatinine, Ser: 0.89 mg/dL (ref 0.61–1.24)
Creatinine, Ser: 0.82 mg/dL (ref 0.61–1.24)
GFR, Estimated: >60 mL/min (ref >60)
GFR, Estimated: >60 mL/min (ref >60)
GFR, Estimated: >60 mL/min (ref >60)
GFR, Estimated: >60 mL/min (ref >60)
Glucose, Bld: 225 mg/dL — ABNORMAL HIGH (ref 70–99)
Glucose, Bld: 163 mg/dL — ABNORMAL HIGH (ref 70–99)
Glucose, Bld: 173 mg/dL — ABNORMAL HIGH (ref 70–99)
Glucose, Bld: 159 mg/dL — ABNORMAL HIGH (ref 70–99)
Potassium: 4 mmol/L (ref 3.5–5.1)
Potassium: 4.1 mmol/L (ref 3.5–5.1)
Potassium: 3.7 mmol/L (ref 3.5–5.1)
Potassium: 3.5 mmol/L (ref 3.5–5.1)
Sodium: 138 mmol/L (ref 135–145)
Sodium: 134 mmol/L — ABNORMAL LOW (ref 135–145)
Sodium: 139 mmol/L (ref 135–145)
Sodium: 137 mmol/L (ref 135–145)

## 2023-11-20 LAB — MRSA NEXT GEN BY PCR, NASAL: MRSA by PCR Next Gen: NOT DETECTED

## 2023-11-20 LAB — CBG MONITORING, ED
Glucose-Capillary: 277 mg/dL — ABNORMAL HIGH (ref 70–99)
Glucose-Capillary: 269 mg/dL — ABNORMAL HIGH (ref 70–99)
Glucose-Capillary: 270 mg/dL — ABNORMAL HIGH (ref 70–99)
Glucose-Capillary: 191 mg/dL — ABNORMAL HIGH (ref 70–99)
Glucose-Capillary: 168 mg/dL — ABNORMAL HIGH (ref 70–99)
Glucose-Capillary: 145 mg/dL — ABNORMAL HIGH (ref 70–99)
Glucose-Capillary: 149 mg/dL — ABNORMAL HIGH (ref 70–99)
Glucose-Capillary: 158 mg/dL — ABNORMAL HIGH (ref 70–99)
Glucose-Capillary: 140 mg/dL — ABNORMAL HIGH (ref 70–99)
Glucose-Capillary: 136 mg/dL — ABNORMAL HIGH (ref 70–99)

## 2023-11-20 LAB — LIPASE, BLOOD: Lipase: 286 U/L — ABNORMAL HIGH (ref 11–51)

## 2023-11-20 LAB — COMPREHENSIVE METABOLIC PANEL WITH GFR
ALT: 23 U/L (ref 0–44)
AST: 19 U/L (ref 15–41)
Albumin: 5.1 g/dL — ABNORMAL HIGH (ref 3.5–5.0)
Alkaline Phosphatase: 81 U/L (ref 38–126)
Anion gap: 26 — ABNORMAL HIGH (ref 5–15)
BUN: 31 mg/dL — ABNORMAL HIGH (ref 6–20)
CO2: 7 mmol/L — ABNORMAL LOW (ref 22–32)
Calcium: 8.8 mg/dL — ABNORMAL LOW (ref 8.9–10.3)
Chloride: 106 mmol/L (ref 98–111)
Creatinine, Ser: 1.4 mg/dL — ABNORMAL HIGH (ref 0.61–1.24)
GFR, Estimated: >60 mL/min (ref >60)
Glucose, Bld: 325 mg/dL — ABNORMAL HIGH (ref 70–99)
Potassium: 4.7 mmol/L (ref 3.5–5.1)
Sodium: 139 mmol/L (ref 135–145)
Total Bilirubin: 1.6 mg/dL — ABNORMAL HIGH (ref 0.0–1.2)
Total Protein: 8.5 g/dL — ABNORMAL HIGH (ref 6.5–8.1)

## 2023-11-20 LAB — GLUCOSE, CAPILLARY
Glucose-Capillary: 122 mg/dL — ABNORMAL HIGH (ref 70–99)
Glucose-Capillary: 138 mg/dL — ABNORMAL HIGH (ref 70–99)
Glucose-Capillary: 141 mg/dL — ABNORMAL HIGH (ref 70–99)
Glucose-Capillary: 144 mg/dL — ABNORMAL HIGH (ref 70–99)
Glucose-Capillary: 146 mg/dL — ABNORMAL HIGH (ref 70–99)
Glucose-Capillary: 146 mg/dL — ABNORMAL HIGH (ref 70–99)
Glucose-Capillary: 154 mg/dL — ABNORMAL HIGH (ref 70–99)
Glucose-Capillary: 156 mg/dL — ABNORMAL HIGH (ref 70–99)
Glucose-Capillary: 157 mg/dL — ABNORMAL HIGH (ref 70–99)
Glucose-Capillary: 179 mg/dL — ABNORMAL HIGH (ref 70–99)

## 2023-11-20 LAB — BETA-HYDROXYBUTYRIC ACID
Beta-Hydroxybutyric Acid: >8 mmol/L — ABNORMAL HIGH (ref 0.05–0.27)
Beta-Hydroxybutyric Acid: 6.65 mmol/L — ABNORMAL HIGH (ref 0.05–0.27)
Beta-Hydroxybutyric Acid: 4.4 mmol/L — ABNORMAL HIGH (ref 0.05–0.27)
Beta-Hydroxybutyric Acid: 3.96 mmol/L — ABNORMAL HIGH (ref 0.05–0.27)
Beta-Hydroxybutyric Acid: 3.15 mmol/L — ABNORMAL HIGH (ref 0.05–0.27)

## 2023-11-20 LAB — HEMOGLOBIN A1C
Hgb A1c MFr Bld: 12.1 % — ABNORMAL HIGH (ref 4.8–5.6)
Mean Plasma Glucose: 300.57 mg/dL

## 2023-11-20 LAB — TROPONIN I (HIGH SENSITIVITY)
Troponin I (High Sensitivity): 4 ng/L (ref <18)
Troponin I (High Sensitivity): 5 ng/L (ref <18)

## 2023-11-20 LAB — MAGNESIUM: Magnesium: 2.6 mg/dL — ABNORMAL HIGH (ref 1.7–2.4)

## 2023-11-20 LAB — LACTIC ACID, PLASMA: Lactic Acid, Venous: 1.9 mmol/L (ref 0.5–1.9)

## 2023-11-20 LAB — HIV ANTIBODY (ROUTINE TESTING W REFLEX): HIV Screen 4th Generation wRfx: NONREACTIVE

## 2023-11-20 MED ORDER — POTASSIUM CHLORIDE 10 MEQ/100ML IV SOLN
10.0000 meq | INTRAVENOUS | Status: AC
  Administered 2023-11-20 (×2): 10 meq via INTRAVENOUS
  Filled 2023-11-20 (×2): qty 100

## 2023-11-20 MED ORDER — DEXTROSE 50 % IV SOLN: 0.0000 mL | INTRAVENOUS | Status: DC | PRN

## 2023-11-20 MED ORDER — ONDANSETRON HCL 4 MG/2ML IJ SOLN
4.0000 mg | Freq: Four times a day (QID) | INTRAMUSCULAR | Status: DC | PRN
  Administered 2023-11-20 – 2023-11-23 (×11): 4 mg via INTRAVENOUS
  Filled 2023-11-20 (×11): qty 2

## 2023-11-20 MED ORDER — LACTATED RINGERS IV SOLN: INTRAVENOUS | Status: DC

## 2023-11-20 MED ORDER — ENOXAPARIN SODIUM 40 MG/0.4ML IJ SOSY
40.0000 mg | PREFILLED_SYRINGE | INTRAMUSCULAR | Status: DC
  Administered 2023-11-20 – 2023-11-24 (×5): 40 mg via SUBCUTANEOUS
  Filled 2023-11-20 (×5): qty 0.4

## 2023-11-20 MED ORDER — BACLOFEN 10 MG PO TABS
10.0000 mg | ORAL_TABLET | Freq: Three times a day (TID) | ORAL | Status: DC | PRN
  Administered 2023-11-20 – 2023-11-23 (×9): 10 mg via ORAL
  Filled 2023-11-20 (×9): qty 1

## 2023-11-20 MED ORDER — MORPHINE SULFATE (PF) 4 MG/ML IV SOLN
4.0000 mg | Freq: Once | INTRAVENOUS | Status: AC
  Administered 2023-11-20: 4 mg via INTRAVENOUS
  Filled 2023-11-20: qty 1

## 2023-11-20 MED ORDER — ONDANSETRON HCL 4 MG/2ML IJ SOLN
4.0000 mg | Freq: Once | INTRAMUSCULAR | Status: AC
  Administered 2023-11-20: 4 mg via INTRAVENOUS
  Filled 2023-11-20: qty 2

## 2023-11-20 MED ORDER — CHLORHEXIDINE GLUCONATE CLOTH 2 % EX PADS
6.0000 | MEDICATED_PAD | Freq: Every day | CUTANEOUS | Status: DC
  Administered 2023-11-20 – 2023-11-22 (×3): 6 via TOPICAL

## 2023-11-20 MED ORDER — METOCLOPRAMIDE HCL 5 MG/ML IJ SOLN
10.0000 mg | Freq: Once | INTRAMUSCULAR | Status: AC
  Administered 2023-11-20: 10 mg via INTRAVENOUS
  Filled 2023-11-20: qty 2

## 2023-11-20 MED ORDER — KCL-LACTATED RINGERS-D5W 20 MEQ/L IV SOLN
INTRAVENOUS | Status: AC
  Filled 2023-11-20 (×8): qty 1000

## 2023-11-20 MED ORDER — LACTATED RINGERS IV BOLUS
1000.0000 mL | Freq: Once | INTRAVENOUS | Status: AC
  Administered 2023-11-20: 1000 mL via INTRAVENOUS

## 2023-11-20 MED ORDER — IOHEXOL 300 MG/ML  SOLN
100.0000 mL | Freq: Once | INTRAMUSCULAR | Status: AC | PRN
  Administered 2023-11-20: 100 mL via INTRAVENOUS

## 2023-11-20 MED ORDER — SODIUM BICARBONATE 8.4 % IV SOLN
100.0000 meq | Freq: Once | INTRAVENOUS | Status: AC
  Administered 2023-11-20: 100 meq via INTRAVENOUS
  Filled 2023-11-20: qty 50

## 2023-11-20 MED ORDER — ACETAMINOPHEN 325 MG PO TABS
975.0000 mg | ORAL_TABLET | Freq: Four times a day (QID) | ORAL | Status: DC | PRN
  Administered 2023-11-20 – 2023-11-21 (×2): 975 mg via ORAL
  Filled 2023-11-20 (×2): qty 3

## 2023-11-20 MED ORDER — MORPHINE SULFATE (PF) 2 MG/ML IV SOLN
2.0000 mg | Freq: Once | INTRAVENOUS | Status: AC
  Administered 2023-11-20: 2 mg via INTRAVENOUS
  Filled 2023-11-20: qty 1

## 2023-11-20 MED ORDER — INSULIN REGULAR(HUMAN) IN NACL 100-0.9 UT/100ML-% IV SOLN
INTRAVENOUS | Status: DC
  Administered 2023-11-20: 12 [IU]/h via INTRAVENOUS
  Administered 2023-11-21: 2.2 [IU]/h via INTRAVENOUS
  Filled 2023-11-20 (×2): qty 100

## 2023-11-20 MED ORDER — DEXTROSE IN LACTATED RINGERS 5 % IV SOLN: INTRAVENOUS | Status: DC

## 2023-11-20 NOTE — ED Notes (Signed)
 Pt has family in the room with them and was given a cup of ice water, no other needs at this time

## 2023-11-20 NOTE — Plan of Care (Signed)
  Problem: Education: Goal: Ability to describe self-care measures that may prevent or decrease complications (Diabetes Survival Skills Education) will improve Outcome: Progressing Goal: Individualized Educational Video(s) Outcome: Progressing   Problem: Coping: Goal: Ability to adjust to condition or change in health will improve Outcome: Progressing   Problem: Fluid Volume: Goal: Ability to maintain a balanced intake and output will improve Outcome: Progressing   Problem: Health Behavior/Discharge Planning: Goal: Ability to identify and utilize available resources and services will improve Outcome: Progressing Goal: Ability to manage health-related needs will improve Outcome: Progressing   Problem: Metabolic: Goal: Ability to maintain appropriate glucose levels will improve Outcome: Progressing   Problem: Education: Goal: Ability to describe self-care measures that may prevent or decrease complications (Diabetes Survival Skills Education) will improve Outcome: Progressing

## 2023-11-20 NOTE — ED Provider Notes (Signed)
 [facility] Provider Note    Event Date/Time   First MD Initiated Contact with Patient 11/20/23 0034     (approximate)   History   Hyperglycemia   HPI  Bruce Brandt is a 53 y.o. male who presents to the ED for evaluation of Hyperglycemia   Reviewed PCP visit from May.  History of DM, HLD  Patient presents for evaluation of constipation, atraumatic low back pain, nausea vomiting progressively worsening over the past few days.  Abdominal surgical history of appendectomy, no bowel movement since Wednesday.  No history of chronic low back pain but has had severe atraumatic low back pain over the past couple days, cannot keep anything down recurrent emesis.  Reports recent medication changes where he was taken off of his insulin , started on metformin  but has not been able to pick up this medication yet. Continues on Jardiance .    Physical Exam   Triage Vital Signs: ED Triage Vitals  Encounter Vitals Group     BP 11/20/23 0028 (!) 159/83     Girls Systolic BP Percentile --      Girls Diastolic BP Percentile --      Boys Systolic BP Percentile --      Boys Diastolic BP Percentile --      Pulse Rate 11/20/23 0028 (!) 120     Resp 11/20/23 0028 (!) 26     Temp 11/20/23 0028 97.8 F (36.6 C)     Temp src --      SpO2 11/20/23 0024 100 %     Weight 11/20/23 0029 209 lb (94.8 kg)     Height 11/20/23 0029 6' (1.829 m)     Head Circumference --      Peak Flow --      Pain Score 11/20/23 0029 10     Pain Loc --      Pain Education --      Exclude from Growth Chart --     Most recent vital signs: Vitals:   11/20/23 0330 11/20/23 0408  BP: (!) 148/79 138/78  Pulse: (!) 117 (!) 121  Resp: (!) 25 (!) 22  Temp:  97.6 F (36.4 C)  SpO2: 91% 98%    General: Awake,.  Sitting upright in bed, noticeably tachypneic and appears uncomfortable. CV:  Good peripheral perfusion.  Resp:  Tachypneic with clear lungs Abd:  No distention.  Soft and  nontender MSK:  No deformity noted.  Neuro:  No focal deficits appreciated. Other:  With some assistance he is able to lean forward so I can examine his back.  Fairly diffuse paraspinal bilateral lower lumbar tenderness.  No signs of trauma or skin changes.  No bony tenderness to the midline spine   ED Results / Procedures / Treatments   Labs (all labs ordered are listed, but only abnormal results are displayed) Labs Reviewed  COMPREHENSIVE METABOLIC PANEL WITH GFR - Abnormal; Notable for the following components:      Result Value   CO2 7 (*)    Glucose, Bld 325 (*)    BUN 31 (*)    Creatinine, Ser 1.40 (*)    Calcium  8.8 (*)    Total Protein 8.5 (*)    Albumin 5.1 (*)    Total Bilirubin 1.6 (*)    Anion gap 26 (*)    All other components within normal limits  CBC WITH DIFFERENTIAL/PLATELET - Abnormal; Notable for the following components:   WBC 19.3 (*)  HCT 54.7 (*)    Neutro Abs 16.5 (*)    Monocytes Absolute 1.3 (*)    Abs Immature Granulocytes 0.14 (*)    All other components within normal limits  MAGNESIUM - Abnormal; Notable for the following components:   Magnesium 2.6 (*)    All other components within normal limits  LIPASE, BLOOD - Abnormal; Notable for the following components:   Lipase 286 (*)    All other components within normal limits  BLOOD GAS, VENOUS - Abnormal; Notable for the following components:   pH, Ven 7 (*)    pCO2, Ven <18 (*)    pO2, Ven 47 (*)    Bicarbonate 4.2 (*)    Acid-base deficit 25.5 (*)    All other components within normal limits  BETA-HYDROXYBUTYRIC ACID - Abnormal; Notable for the following components:   Beta-Hydroxybutyric Acid >8.00 (*)    All other components within normal limits  CBG MONITORING, ED - Abnormal; Notable for the following components:   Glucose-Capillary 277 (*)    All other components within normal limits  CBG MONITORING, ED - Abnormal; Notable for the following components:   Glucose-Capillary 269 (*)     All other components within normal limits  CBG MONITORING, ED - Abnormal; Notable for the following components:   Glucose-Capillary 270 (*)    All other components within normal limits  CULTURE, BLOOD (ROUTINE X 2)  CULTURE, BLOOD (ROUTINE X 2)  URINALYSIS, ROUTINE W REFLEX MICROSCOPIC  LACTIC ACID, PLASMA  LACTIC ACID, PLASMA  BASIC METABOLIC PANEL WITH GFR  HIV ANTIBODY (ROUTINE TESTING W REFLEX)  BASIC METABOLIC PANEL WITH GFR  BASIC METABOLIC PANEL WITH GFR  BASIC METABOLIC PANEL WITH GFR  BASIC METABOLIC PANEL WITH GFR  BASIC METABOLIC PANEL WITH GFR  BETA-HYDROXYBUTYRIC ACID  BETA-HYDROXYBUTYRIC ACID  BETA-HYDROXYBUTYRIC ACID  BETA-HYDROXYBUTYRIC ACID  BETA-HYDROXYBUTYRIC ACID  HEMOGLOBIN A1C  TROPONIN I (HIGH SENSITIVITY)  TROPONIN I (HIGH SENSITIVITY)    EKG   RADIOLOGY CXR interpreted by me without evidence of acute cardiopulmonary pathology. CT chest, abdomen and pelvis interpreted by me without evidence of acute pathology  Official radiology report(s): CT L-SPINE NO CHARGE Result Date: 11/20/2023 CLINICAL DATA:  Back pain EXAM: CT Lumbar Spine with contrast TECHNIQUE: Technique: Multiplanar CT images of the lumbar spine were reconstructed from contemporary CT of the Abdomen and Pelvis. RADIATION DOSE REDUCTION: This exam was performed according to the departmental dose-optimization program which includes automated exposure control, adjustment of the mA and/or kV according to patient size and/or use of iterative reconstruction technique. CONTRAST:  No additional COMPARISON:  None Available. FINDINGS: Segmentation: 5 lumbar type vertebral bodies are well visualized. Alignment: Alignment is within normal limits. Vertebrae: Vertebral body height is well maintained. No anterolisthesis is seen. Osteophytic changes are noted posteriorly at T12-L1 and L1-L2. Anterior osteophytes are noted as well. Facet hypertrophic changes are seen. No acute fracture or acute facet abnormality  is noted. Visualize ribcage is within normal limits. Paraspinal and other soft tissues: Paraspinal tissues are within normal limits. Disc levels: No significant disc pathology is noted. IMPRESSION: Degenerative change without acute abnormality. Electronically Signed   By: Oneil Devonshire M.D.   On: 11/20/2023 03:46   CT CHEST ABDOMEN PELVIS W CONTRAST Result Date: 11/20/2023 CLINICAL DATA:  DKA with nausea and vomiting EXAM: CT CHEST, ABDOMEN, AND PELVIS WITH CONTRAST TECHNIQUE: Multidetector CT imaging of the chest, abdomen and pelvis was performed following the standard protocol during bolus administration of intravenous contrast. RADIATION DOSE REDUCTION: This  exam was performed according to the departmental dose-optimization program which includes automated exposure control, adjustment of the mA and/or kV according to patient size and/or use of iterative reconstruction technique. CONTRAST:  OMNIPAQUE  IOHEXOL  300 MG/ML  SOLN COMPARISON:  02/24/2022 FINDINGS: CT CHEST FINDINGS Cardiovascular: Aorta and its branches are within normal limits. No cardiac enlargement is seen. Pulmonary artery is within normal limits. Mediastinum/Nodes: Thoracic inlet is unremarkable. No hilar or mediastinal adenopathy is noted. The esophagus as visualized is within normal limits. Lungs/Pleura: Lungs are well aerated bilaterally. No focal infiltrate or effusion is noted. Tiny subpleural nodule is noted in the right lower lobe measuring 3 mm. No other nodules are noted. Musculoskeletal: No chest wall mass or suspicious bone lesions identified. CT ABDOMEN PELVIS FINDINGS Hepatobiliary: No focal liver abnormality is seen. No gallstones, gallbladder wall thickening, or biliary dilatation. Pancreas: Unremarkable. No pancreatic ductal dilatation or surrounding inflammatory changes. Spleen: Normal in size without focal abnormality. Adrenals/Urinary Tract: Left adrenal gland again demonstrates small nodules. These are most consistent  with adenomas, stable from the prior exam. Right adrenal gland is within normal limits. No renal calculi or obstructive changes are noted. Normal enhancement is seen. The bladder is well distended. Stomach/Bowel: The appendix is not well visualized. No inflammatory changes to suggest appendicitis are noted. No obstructive or inflammatory change of the colon is seen. Stomach is distended fluid. Small bowel is unremarkable. Vascular/Lymphatic: No significant vascular findings are present. No enlarged abdominal or pelvic lymph nodes. Reproductive: Prostate is unremarkable. Other: No abdominal wall hernia or abnormality. No abdominopelvic ascites. Musculoskeletal: Mild degenerative changes of lumbar spine are noted. IMPRESSION: CT of the chest:Right solid pulmonary nodule measuring 3 mm. Per Fleischner Society Guidelines, no routine follow-up imaging is recommended. These guidelines do not apply to immunocompromised patients and patients with cancer. Follow up in patients with significant comorbidities as clinically warranted. For lung cancer screening, adhere to Lung-RADS guidelines. Reference: Radiology. 2017; 284(1):228-43. No other focal abnormality is noted. CT of the abdomen and pelvis: Stable left adrenal nodules consistent with small adenomas. No acute abnormality noted. Electronically Signed   By: Oneil Devonshire M.D.   On: 11/20/2023 03:44   DG Chest Portable 1 View Result Date: 11/20/2023 CLINICAL DATA:  Nausea and vomiting EXAM: PORTABLE CHEST 1 VIEW COMPARISON:  None Available. FINDINGS: The heart size and mediastinal contours are within normal limits. Both lungs are clear. The visualized skeletal structures are unremarkable. IMPRESSION: No active disease. Electronically Signed   By: Oneil Devonshire M.D.   On: 11/20/2023 02:07    PROCEDURES and INTERVENTIONS:  .Critical Care  Performed by: Claudene Rover, MD Authorized by: Claudene Rover, MD   Critical care provider statement:    Critical care time  (minutes):  30   Critical care time was exclusive of:  Separately billable procedures and treating other patients   Critical care was necessary to treat or prevent imminent or life-threatening deterioration of the following conditions:  Endocrine crisis and metabolic crisis   Critical care was time spent personally by me on the following activities:  Development of treatment plan with patient or surrogate, discussions with consultants, evaluation of patient's response to treatment, examination of patient, ordering and review of laboratory studies, ordering and review of radiographic studies, ordering and performing treatments and interventions, pulse oximetry, re-evaluation of patient's condition and review of old charts .1-3 Lead EKG Interpretation  Performed by: Claudene Rover, MD Authorized by: Claudene Rover, MD     Interpretation: abnormal  ECG rate:  120   ECG rate assessment: tachycardic     Rhythm: sinus tachycardia     Ectopy: none     Conduction: normal     Medications  insulin  regular, human (MYXREDLIN) 100 units/ 100 mL infusion (13 Units/hr Intravenous Rate/Dose Change 11/20/23 0413)  lactated ringers  infusion ( Intravenous New Bag/Given 11/20/23 0416)  dextrose 5 % in lactated ringers  infusion (has no administration in time range)  dextrose 50 % solution 0-50 mL (has no administration in time range)  potassium chloride 10 mEq in 100 mL IVPB (10 mEq Intravenous New Bag/Given 11/20/23 0422)  enoxaparin (LOVENOX) injection 40 mg (has no administration in time range)  lactated ringers  bolus 1,000 mL (0 mLs Intravenous Stopped 11/20/23 0220)  morphine  (PF) 4 MG/ML injection 4 mg (4 mg Intravenous Given 11/20/23 0120)  ondansetron  (ZOFRAN ) injection 4 mg (4 mg Intravenous Given 11/20/23 0119)  lactated ringers  bolus 1,000 mL (0 mLs Intravenous Stopped 11/20/23 0410)  iohexol  (OMNIPAQUE ) 300 MG/ML solution 100 mL (100 mLs Intravenous Contrast Given 11/20/23 0321)  metoCLOPramide   (REGLAN ) injection 10 mg (10 mg Intravenous Given 11/20/23 0422)     IMPRESSION / MDM / ASSESSMENT AND PLAN / ED COURSE  I reviewed the triage vital signs and the nursing notes.  Differential diagnosis includes, but is not limited to, DKA, dehydration, symptomatic anemia, AKI, paraspinal abscess, peritonitis  {Patient presents with symptoms of an acute illness or injury that is potentially life-threatening.  Type II diabetic with recent medication changes presents with recurrent emesis, signs of DKA requiring insulin  drip and medical admission.  Tachycardic but stable.  No abdominal tenderness, signs of neurologic deficits or skin changes/trauma to his back at area of pain.  pH is 7.0, AKI, white count of 20 without clear signs of infectious etiology of his symptoms.  Reassuring imaging without underlying etiology, may be related to medication changes.  Consult with ICU and medicine for admission  Clinical Course as of 11/20/23 0429  [place] Nov 20, 2023  0129 Call from respiratory about VBG results.  I immediately reassessed the patient, update him and his wife of my concerns and what to expect.  Update the nurse and ask him to place a second IV. [DS]  0228 I discuss with ICU, they agree hospitalist service in stepdown unit should be fine [DS]  0244 reassessed [DS]  0301 reassessed [DS]  0423 Consult with hospitalist who agrees to admit [DS]    Clinical Course User Index [DS] Claudene Rover, MD     FINAL CLINICAL IMPRESSION(S) / ED DIAGNOSES   Final diagnoses:  Diabetic ketoacidosis without coma associated with type 2 diabetes mellitus (HCC)     Rx / DC Orders   ED Discharge Orders     None        Note:  This document was prepared using Dragon voice recognition software and may include unintentional dictation errors.   Claudene Rover, MD 11/20/23 [phone]

## 2023-11-20 NOTE — Assessment & Plan Note (Signed)
 Suspect secondary to DKA Monitor for improvement with correction of DKA, IV fluid resuscitation

## 2023-11-20 NOTE — Assessment & Plan Note (Signed)
No acute findings on imaging

## 2023-11-20 NOTE — Inpatient Diabetes Management (Signed)
 Inpatient Diabetes Program Recommendations  AACE/ADA: New Consensus Statement on Inpatient Glycemic Control (2015)  Target Ranges:  Prepandial:   less than 140 mg/dL      Peak postprandial:   less than 180 mg/dL (1-2 hours)      Critically ill patients:  140 - 180 mg/dL   Lab Results  Component Value Date   GLUCAP 144 (H) 11/20/2023   HGBA1C 12.1 (H) 11/20/2023    Review of Glycemic Control  Diabetes history: LADA Outpatient Diabetes medications: Jardiance  25 mg daily, previously on Humalog 75/25 20 BID, Mounjaro 25 mg weekly, Libre 3 Current orders for Inpatient glycemic control: IV insulin  per EndoTool for DKA  Hgb1C - 12.1%  Inpatient Diabetes Program Recommendations:    Continue IV insulin  drip until criteria met for discontinuation.  Diabetes Coordinator will see pt on 10/13 regarding his HgbA1C of 12.1% and necessity of taking insulin  for LADA.  Follow.  Thank you. Shona Brandy, RD, LDN, CDCES Inpatient Diabetes Coordinator [phone]

## 2023-11-20 NOTE — ED Triage Notes (Signed)
 PT BIB [facility] 20 from home with c/o Nausea, Vomiting, constipation, since Thursday. PT is a diabetic. Pt takes Jardiance . EMS gave pt 4 mg Zofran  and 500mL NS bolus. Pt reports feeling thirsty. Pt states that he has been constipated since Thursday, and that it hurts to sit, or lay down. BGL for EMS 289.  BGL triage 277

## 2023-11-20 NOTE — ED Notes (Signed)
 RN Zachary advised pt spouse NPO, walked out of the room to grab warm blankets when I walked back in spouse was giving the pt drink.

## 2023-11-20 NOTE — Assessment & Plan Note (Signed)
 Continue IV fluid resuscitation, potassium repletion, IV insulin  per Endo tool Serial BMPs per Endo tool Transition to subcu when gap closed and less acidotic Patient is in severe DKA so close monitoring required in stepdown

## 2023-11-20 NOTE — H&P (Signed)
 History and Physical    Patient: Bruce Brandt FMW:[id number] DOB: 12/16/1970 DOA: 11/20/2023 DOS: the patient was seen and examined on 11/20/2023 PCP: Antonette Angeline ORN, NP  Patient coming from: Home  Chief Complaint:  Chief Complaint  Patient presents with   Hyperglycemia    HPI: Bruce Brandt is a 53 y.o. male with medical history significant for Insulin -dependent type 2 diabetes being admitted with DKA in the setting of having a medication change about 4 days ago.  His insulin  appears to have been stopped in favor of metformin  which she has not yet started.  On the day of arrival he developed nausea and vomiting and increased thirst.  According to wife at the bedside, he also developed severe low back pain about 4 days ago along with constipation.  No prior history of constipation or chronic low back pain.  He has had no fever or chills, cough and denies abdominal pain. In the ED, tachycardic to 120 and tachypneic to 26 with BP 159/83 on arrival.  Afebrile and satting at 100% on room air Labs notable for WBC of 19,000, lactic acid pending Blood glucose 325 with anion gap of 26 and bicarb of 7.  Beta hydroxybutyric acid greater than 8 and venous pH 7. Chest x-ray clear CT abdomen and pelvis as well as chest showed a 3 mm right solid pulmonary nodule and stable left adrenal nodules consistent with small adenomas.  CT lumbar spine showing degenerative changes. Patient was started on LR boluses potassium repletion and insulin  infusion per Endo tool The ED provider spoke with the ICU who deferred admission to hospitalist service. Admission requested     Past Medical History:  Diagnosis Date   Carpal tunnel syndrome, bilateral 05/18/2016   Diabetes (HCC)    Frequent headaches    Gingivitis    Past Surgical History:  Procedure Laterality Date   COLONOSCOPY N/A 10/17/2020   Procedure: COLONOSCOPY;  Surgeon: Therisa Bi, MD;  Location: [facility] ENDOSCOPY;  Service: Gastroenterology;   Laterality: N/A;   COLONOSCOPY WITH PROPOFOL      LAPAROSCOPIC APPENDECTOMY N/A 02/24/2022   Procedure: APPENDECTOMY LAPAROSCOPIC;  Surgeon: Lane Shope, MD;  Location: [facility] ORS;  Service: General;  Laterality: N/A;   MOUTH SURGERY     gingival repair   VASECTOMY     Social History:  reports that he has never smoked. He has never used smokeless tobacco. He reports current alcohol use. He reports that he does not use drugs.  Allergies  Allergen Reactions   Metformin  Hcl Other (See Comments)    GI upset   Covid-19 (Mrna) Vaccine Proofreader) [Covid-19 (Mrna) Vaccine]     Bruising under great toe on both feet 1 week s/p Moderna Immunization    Family History  Problem Relation Age of Onset   Heart disease Father    Diabetes Maternal Grandmother    Diabetes Paternal Grandmother     Prior to Admission medications   Medication Sig Start Date End Date Taking? Authorizing Provider  aspirin EC 81 MG tablet Take 81 mg by mouth daily.    [provider]  atorvastatin  (LIPITOR) 20 MG tablet TAKE 1 TABLET BY MOUTH EVERY DAY 08/16/23   Antonette Angeline ORN, NP  baclofen (LIORESAL) 10 MG tablet Take 10 mg by mouth daily as needed (Headache). 11/24/20   [provider]  cetirizine (ZYRTEC) 10 MG tablet Take 10 mg by mouth daily.    [provider]  Cholecalciferol (VITAMIN D-3) 125 MCG (5000 UT) TABS Take  2 capsules by mouth daily.    [provider]  Co-Enzyme Q-10 30 MG CAPS Take 30 mg by mouth daily.    [provider]  Continuous Blood Gluc Sensor (FREESTYLE LIBRE 3 SENSOR) MISC 1 Device by Does not apply route every 14 (fourteen) days. Place 1 sensor on the skin every 14 days. Use to check glucose continuously 10/15/21   Antonette Angeline ORN, NP  glucose blood (ONETOUCH ULTRA) test strip Check blood sugar up to three times a day 09/09/20   Antonette Angeline ORN, NP  HUMALOG MIX 75/25 KWIKPEN (75-25) 100 UNIT/ML KwikPen Inject 10-14 Units into the skin 2 (two) times daily  before a meal. 01/26/22   [provider]  hydrocortisone  2.5 % lotion Apply topically 2 (two) times daily. 11/19/19   Malfi, Nat HERO, FNP  ibuprofen  (ADVIL ) 600 MG tablet Take 1 tablet (600 mg total) by mouth every 6 (six) hours as needed. 02/25/22   Schulz, Zachary R, PA-C  imipramine  (TOFRANIL ) 50 MG tablet Take 100 mg by mouth daily. 12/27/18   [provider]  Insulin  Pen Needle (BD PEN NEEDLE NANO 2ND GEN) 32G X 4 MM MISC BD PEN NEEDLES- BRAND SPECIFIC. INJECT INSULIN  VIA INSULIN  PEN 6 X DAILY 02/12/23   Antonette Angeline ORN, NP  JARDIANCE  25 MG TABS tablet TAKE 1 TABLET (25 MG TOTAL) BY MOUTH DAILY. 11/15/23   Antonette Angeline ORN, NP  Magnesium 100 MG TABS Take 200 mg by mouth daily.    [provider]  multivitamin-iron-minerals-folic acid (CENTRUM) chewable tablet Chew 1 tablet by mouth daily.    [provider]  Naproxen Sod-diphenhydrAMINE  220-25 MG TABS Take 2 tablets by mouth at bedtime as needed.    [provider]  TRESIBA FLEXTOUCH 100 UNIT/ML FlexTouch Pen Inject 12 Units into the skin daily.    [provider]  Turmeric 400 MG CAPS Take 400 mg by mouth daily.    [provider]  zonisamide (ZONEGRAN) 100 MG capsule Take 300 mg by mouth daily. Patient not taking: Reported on 06/10/2023 03/13/20   [provider]    Physical Exam: Vitals:   11/20/23 0028 11/20/23 0029 11/20/23 0230 11/20/23 0330  BP: (!) 159/83  (!) 151/82 (!) 148/79  Pulse: (!) 120  (!) 116 (!) 117  Resp: (!) 26  20 (!) 25  Temp: 97.8 F (36.6 C)     SpO2: 99%  100% 91%  Weight:  94.8 kg    Height:  6' (1.829 m)     Physical Exam Vitals and nursing note reviewed.  Constitutional:      Appearance: He is ill-appearing.     Comments: Kussmaul respiration  HENT:     Head: Normocephalic and atraumatic.  Cardiovascular:     Rate and Rhythm: Regular rhythm. Tachycardia present.     Heart sounds: Normal heart sounds.  Pulmonary:     Effort:  Tachypnea present.     Breath sounds: Normal breath sounds.  Abdominal:     Palpations: Abdomen is soft.     Tenderness: There is no abdominal tenderness.  Neurological:     General: No focal deficit present.     Mental Status: He is lethargic.     Labs on Admission: I have personally reviewed following labs and imaging studies  CBC: Recent Labs  Lab 11/20/23 0103  WBC 19.3*  NEUTROABS 16.5*  HGB 17.0  HCT 54.7*  MCV 94.1  PLT 242   Basic Metabolic Panel: Recent Labs  Lab 11/20/23 0103  NA 139  K 4.7  CL 106  CO2 7*  GLUCOSE 325*  BUN 31*  CREATININE 1.40*  CALCIUM  8.8*  MG 2.6*   GFR: Estimated Creatinine Clearance: 72.9 mL/min (A) (by C-G formula based on SCr of 1.4 mg/dL (H)). Liver Function Tests: Recent Labs  Lab 11/20/23 0103  AST 19  ALT 23  ALKPHOS 81  BILITOT 1.6*  PROT 8.5*  ALBUMIN 5.1*   Recent Labs  Lab 11/20/23 0103  LIPASE 286*   No results for input(s): AMMONIA in the last 168 hours. Coagulation Profile: No results for input(s): INR, PROTIME in the last 168 hours. Cardiac Enzymes: No results for input(s): CKTOTAL, CKMB, CKMBINDEX, TROPONINI in the last 168 hours. BNP (last 3 results) No results for input(s): PROBNP in the last 8760 hours. HbA1C: No results for input(s): HGBA1C in the last 72 hours. CBG: Recent Labs  Lab 11/20/23 0033 11/20/23 0255  GLUCAP 277* 269*   Lipid Profile: No results for input(s): CHOL, HDL, LDLCALC, TRIG, CHOLHDL, LDLDIRECT in the last 72 hours. Thyroid Function Tests: No results for input(s): TSH, T4TOTAL, FREET4, T3FREE, THYROIDAB in the last 72 hours. Anemia Panel: No results for input(s): VITAMINB12, FOLATE, FERRITIN, TIBC, IRON, RETICCTPCT in the last 72 hours. Urine analysis:    Component Value Date/Time   COLORURINE YELLOW (A) 02/24/2022 0953   APPEARANCEUR CLEAR (A) 02/24/2022 0953   LABSPEC 1.040 (H) 02/24/2022 0953   PHURINE 5.0  02/24/2022 0953   GLUCOSEU >=500 (A) 02/24/2022 0953   HGBUR NEGATIVE 02/24/2022 0953   BILIRUBINUR NEGATIVE 02/24/2022 0953   BILIRUBINUR negative 05/10/2019 0819   KETONESUR 20 (A) 02/24/2022 0953   PROTEINUR NEGATIVE 02/24/2022 0953   UROBILINOGEN 0.2 05/10/2019 0819   NITRITE NEGATIVE 02/24/2022 0953   LEUKOCYTESUR NEGATIVE 02/24/2022 0953    Radiological Exams on Admission: CT L-SPINE NO CHARGE Result Date: 11/20/2023 CLINICAL DATA:  Back pain EXAM: CT Lumbar Spine with contrast TECHNIQUE: Technique: Multiplanar CT images of the lumbar spine were reconstructed from contemporary CT of the Abdomen and Pelvis. RADIATION DOSE REDUCTION: This exam was performed according to the departmental dose-optimization program which includes automated exposure control, adjustment of the mA and/or kV according to patient size and/or use of iterative reconstruction technique. CONTRAST:  No additional COMPARISON:  None Available. FINDINGS: Segmentation: 5 lumbar type vertebral bodies are well visualized. Alignment: Alignment is within normal limits. Vertebrae: Vertebral body height is well maintained. No anterolisthesis is seen. Osteophytic changes are noted posteriorly at T12-L1 and L1-L2. Anterior osteophytes are noted as well. Facet hypertrophic changes are seen. No acute fracture or acute facet abnormality is noted. Visualize ribcage is within normal limits. Paraspinal and other soft tissues: Paraspinal tissues are within normal limits. Disc levels: No significant disc pathology is noted. IMPRESSION: Degenerative change without acute abnormality. Electronically Signed   By: Oneil Devonshire M.D.   On: 11/20/2023 03:46   CT CHEST ABDOMEN PELVIS W CONTRAST Result Date: 11/20/2023 CLINICAL DATA:  DKA with nausea and vomiting EXAM: CT CHEST, ABDOMEN, AND PELVIS WITH CONTRAST TECHNIQUE: Multidetector CT imaging of the chest, abdomen and pelvis was performed following the standard protocol during bolus administration  of intravenous contrast. RADIATION DOSE REDUCTION: This exam was performed according to the departmental dose-optimization program which includes automated exposure control, adjustment of the mA and/or kV according to patient size and/or use of iterative reconstruction technique. CONTRAST:  OMNIPAQUE  IOHEXOL  300 MG/ML  SOLN COMPARISON:  02/24/2022 FINDINGS: CT CHEST FINDINGS Cardiovascular: Aorta and  its branches are within normal limits. No cardiac enlargement is seen. Pulmonary artery is within normal limits. Mediastinum/Nodes: Thoracic inlet is unremarkable. No hilar or mediastinal adenopathy is noted. The esophagus as visualized is within normal limits. Lungs/Pleura: Lungs are well aerated bilaterally. No focal infiltrate or effusion is noted. Tiny subpleural nodule is noted in the right lower lobe measuring 3 mm. No other nodules are noted. Musculoskeletal: No chest wall mass or suspicious bone lesions identified. CT ABDOMEN PELVIS FINDINGS Hepatobiliary: No focal liver abnormality is seen. No gallstones, gallbladder wall thickening, or biliary dilatation. Pancreas: Unremarkable. No pancreatic ductal dilatation or surrounding inflammatory changes. Spleen: Normal in size without focal abnormality. Adrenals/Urinary Tract: Left adrenal gland again demonstrates small nodules. These are most consistent with adenomas, stable from the prior exam. Right adrenal gland is within normal limits. No renal calculi or obstructive changes are noted. Normal enhancement is seen. The bladder is well distended. Stomach/Bowel: The appendix is not well visualized. No inflammatory changes to suggest appendicitis are noted. No obstructive or inflammatory change of the colon is seen. Stomach is distended fluid. Small bowel is unremarkable. Vascular/Lymphatic: No significant vascular findings are present. No enlarged abdominal or pelvic lymph nodes. Reproductive: Prostate is unremarkable. Other: No abdominal wall hernia or  abnormality. No abdominopelvic ascites. Musculoskeletal: Mild degenerative changes of lumbar spine are noted. IMPRESSION: CT of the chest:Right solid pulmonary nodule measuring 3 mm. Per Fleischner Society Guidelines, no routine follow-up imaging is recommended. These guidelines do not apply to immunocompromised patients and patients with cancer. Follow up in patients with significant comorbidities as clinically warranted. For lung cancer screening, adhere to Lung-RADS guidelines. Reference: Radiology. 2017; 284(1):228-43. No other focal abnormality is noted. CT of the abdomen and pelvis: Stable left adrenal nodules consistent with small adenomas. No acute abnormality noted. Electronically Signed   By: Oneil Devonshire M.D.   On: 11/20/2023 03:44   DG Chest Portable 1 View Result Date: 11/20/2023 CLINICAL DATA:  Nausea and vomiting EXAM: PORTABLE CHEST 1 VIEW COMPARISON:  None Available. FINDINGS: The heart size and mediastinal contours are within normal limits. Both lungs are clear. The visualized skeletal structures are unremarkable. IMPRESSION: No active disease. Electronically Signed   By: Oneil Devonshire M.D.   On: 11/20/2023 02:07   Data Reviewed for HPI: Relevant notes from primary care and specialist visits, past discharge summaries as available in EHR, including Care Everywhere. Prior diagnostic testing as pertinent to current admission diagnoses Updated medications and problem lists for reconciliation ED course, including vitals, labs, imaging, treatment and response to treatment Triage notes, nursing and pharmacy notes and ED provider's notes Notable results as noted above in HPI      Assessment and Plan: * Diabetic ketoacidosis without coma associated with type 2 diabetes mellitus (HCC) Continue IV fluid resuscitation, potassium repletion, IV insulin  per Endo tool Serial BMPs per Endo tool Transition to subcu when gap closed and less acidotic Patient is in severe DKA so close monitoring  required in stepdown  Low back pain No acute findings on imaging  Leukocytosis Suspect secondary to DKA Monitor for improvement with correction of DKA, IV fluid resuscitation     DVT prophylaxis: Lovenox  Consults: none  Advance Care Planning:   Code Status: Prior   Family Communication: wife at bedside  Disposition Plan: Back to previous home environment  Severity of Illness: The appropriate patient status for this patient is INPATIENT. Inpatient status is judged to be reasonable and necessary in order to provide the required intensity of  service to ensure the patient's safety. The patient's presenting symptoms, physical exam findings, and initial radiographic and laboratory data in the context of their chronic comorbidities is felt to place them at high risk for further clinical deterioration. Furthermore, it is not anticipated that the patient will be medically stable for discharge from the hospital within 2 midnights of admission.   * I certify that at the point of admission it is my clinical judgment that the patient will require inpatient hospital care spanning beyond 2 midnights from the point of admission due to high intensity of service, high risk for further deterioration and high frequency of surveillance required.*  Author: Delayne LULLA Solian, MD 11/20/2023 4:09 AM  For on call review [email or web address].

## 2023-11-20 NOTE — Progress Notes (Addendum)
 Interim progress note not for billing  I have seen and evaluated the patient, reviewed the chart, and agree with assessment and plan unless stipulated otherwise.  Hx DM, not very compliant with insulin , saw new provider last week who initiated plan to reverse his diabetes and stopped his insulin , now with several days abdominal and back pain, lethargy, then nausea/vomiting. Found to be in profound dka. Will continue insulin  gtt and fluids, monitor labs, will give sodium bicarb now and will continue until ph above 7. Will consult diabetes educator and have made patient/wife aware that insulin  is a necessity from here on out (particularly if patient carries the diagnosis of LADA, as it appears he does). No apparent infectious trigger and ct of abdomen/pelvis unremarkable though does note likely adrenal adenomas.

## 2023-11-21 DIAGNOSIS — E1110 Type 2 diabetes mellitus with ketoacidosis without coma: Secondary | ICD-10-CM | POA: Diagnosis not present

## 2023-11-21 LAB — BASIC METABOLIC PANEL WITH GFR
Anion gap: 15 (ref 5–15)
Anion gap: 8 (ref 5–15)
Anion gap: 9 (ref 5–15)
BUN: 12 mg/dL (ref 6–20)
BUN: 10 mg/dL (ref 6–20)
BUN: 9 mg/dL (ref 6–20)
CO2: 15 mmol/L — ABNORMAL LOW (ref 22–32)
CO2: 21 mmol/L — ABNORMAL LOW (ref 22–32)
CO2: 18 mmol/L — ABNORMAL LOW (ref 22–32)
Calcium: 8.4 mg/dL — ABNORMAL LOW (ref 8.9–10.3)
Calcium: 8.5 mg/dL — ABNORMAL LOW (ref 8.9–10.3)
Calcium: 8.6 mg/dL — ABNORMAL LOW (ref 8.9–10.3)
Chloride: 111 mmol/L (ref 98–111)
Chloride: 112 mmol/L — ABNORMAL HIGH (ref 98–111)
Chloride: 113 mmol/L — ABNORMAL HIGH (ref 98–111)
Creatinine, Ser: 0.75 mg/dL (ref 0.61–1.24)
Creatinine, Ser: 0.64 mg/dL (ref 0.61–1.24)
Creatinine, Ser: 0.56 mg/dL — ABNORMAL LOW (ref 0.61–1.24)
GFR, Estimated: >60 mL/min (ref >60)
GFR, Estimated: >60 mL/min (ref >60)
GFR, Estimated: >60 mL/min (ref >60)
Glucose, Bld: 154 mg/dL — ABNORMAL HIGH (ref 70–99)
Glucose, Bld: 141 mg/dL — ABNORMAL HIGH (ref 70–99)
Glucose, Bld: 145 mg/dL — ABNORMAL HIGH (ref 70–99)
Potassium: 3.6 mmol/L (ref 3.5–5.1)
Potassium: 3.5 mmol/L (ref 3.5–5.1)
Potassium: 3.6 mmol/L (ref 3.5–5.1)
Sodium: 141 mmol/L (ref 135–145)
Sodium: 141 mmol/L (ref 135–145)
Sodium: 140 mmol/L (ref 135–145)

## 2023-11-21 LAB — GLUCOSE, CAPILLARY
Glucose-Capillary: 147 mg/dL — ABNORMAL HIGH (ref 70–99)
Glucose-Capillary: 146 mg/dL — ABNORMAL HIGH (ref 70–99)
Glucose-Capillary: 165 mg/dL — ABNORMAL HIGH (ref 70–99)
Glucose-Capillary: 133 mg/dL — ABNORMAL HIGH (ref 70–99)
Glucose-Capillary: 134 mg/dL — ABNORMAL HIGH (ref 70–99)
Glucose-Capillary: 178 mg/dL — ABNORMAL HIGH (ref 70–99)
Glucose-Capillary: 152 mg/dL — ABNORMAL HIGH (ref 70–99)
Glucose-Capillary: 135 mg/dL — ABNORMAL HIGH (ref 70–99)
Glucose-Capillary: 112 mg/dL — ABNORMAL HIGH (ref 70–99)
Glucose-Capillary: 120 mg/dL — ABNORMAL HIGH (ref 70–99)
Glucose-Capillary: 175 mg/dL — ABNORMAL HIGH (ref 70–99)
Glucose-Capillary: 183 mg/dL — ABNORMAL HIGH (ref 70–99)
Glucose-Capillary: 150 mg/dL — ABNORMAL HIGH (ref 70–99)
Glucose-Capillary: 158 mg/dL — ABNORMAL HIGH (ref 70–99)
Glucose-Capillary: 161 mg/dL — ABNORMAL HIGH (ref 70–99)

## 2023-11-21 LAB — CULTURE, BLOOD (ROUTINE X 2)

## 2023-11-21 LAB — BETA-HYDROXYBUTYRIC ACID
Beta-Hydroxybutyric Acid: 2.69 mmol/L — ABNORMAL HIGH (ref 0.05–0.27)
Beta-Hydroxybutyric Acid: 1.57 mmol/L — ABNORMAL HIGH (ref 0.05–0.27)
Beta-Hydroxybutyric Acid: 3.28 mmol/L — ABNORMAL HIGH (ref 0.05–0.27)

## 2023-11-21 MED ORDER — POTASSIUM CHLORIDE CRYS ER 20 MEQ PO TBCR
60.0000 meq | EXTENDED_RELEASE_TABLET | Freq: Once | ORAL | Status: AC
  Administered 2023-11-21: 60 meq via ORAL
  Filled 2023-11-21: qty 3

## 2023-11-21 MED ORDER — METOCLOPRAMIDE HCL 5 MG/ML IJ SOLN
10.0000 mg | Freq: Three times a day (TID) | INTRAMUSCULAR | Status: DC
  Administered 2023-11-21 – 2023-11-24 (×8): 10 mg via INTRAVENOUS
  Filled 2023-11-21 (×8): qty 2

## 2023-11-21 MED ORDER — ASPIRIN-ACETAMINOPHEN-CAFFEINE 250-250-65 MG PO TABS
2.0000 | ORAL_TABLET | Freq: Once | ORAL | Status: DC
  Filled 2023-11-21: qty 2

## 2023-11-21 MED ORDER — ACETAMINOPHEN 325 MG PO TABS
650.0000 mg | ORAL_TABLET | Freq: Four times a day (QID) | ORAL | Status: DC | PRN
  Administered 2023-11-21 – 2023-11-22 (×3): 650 mg via ORAL
  Filled 2023-11-21 (×3): qty 2

## 2023-11-21 MED ORDER — KCL-LACTATED RINGERS-D5W 20 MEQ/L IV SOLN
INTRAVENOUS | Status: DC
  Filled 2023-11-21 (×14): qty 1000

## 2023-11-21 MED ORDER — ACETAMINOPHEN-CAFFEINE 500-65 MG PO TABS
2.0000 | ORAL_TABLET | Freq: Once | ORAL | Status: AC
  Administered 2023-11-21: 2 via ORAL
  Filled 2023-11-21: qty 2

## 2023-11-21 NOTE — Inpatient Diabetes Management (Addendum)
 Inpatient Diabetes Program Recommendations  AACE/ADA: New Consensus Statement on Inpatient Glycemic Control (2015)  Target Ranges:  Prepandial:   less than 140 mg/dL      Peak postprandial:   less than 180 mg/dL (1-2 hours)      Critically ill patients:  140 - 180 mg/dL   Lab Results  Component Value Date   GLUCAP 178 (H) 11/21/2023   HGBA1C 12.1 (H) 11/20/2023    Review of Glycemic Control  Latest Reference Range & Units 11/21/23 01:29 11/21/23 03:27 11/21/23 05:25 11/21/23 07:22 11/21/23 09:22 11/21/23 11:20  Glucose-Capillary 70 - 99 mg/dL 852 (H) 853 (H) 834 (H) 133 (H) 134 (H) 178 (H)   Diabetes history: DM (Endocrinology note states LADA) Outpatient Diabetes medications:  None-stopped taking insulin  4 days ago per MD order Current orders for Inpatient glycemic control:  IV insulin -  Inpatient Diabetes Program Recommendations:    Note per history, patient stopped taking insulin  4 days ago per advice from MD (unsure what doctor's name was).   Spoke briefly to patient at bedside and asked him about stopping insulin .  He states he was in a DM reversal program and was supposed to be taking metformin .   Asked if I could call his wife to discuss further and he said yes.  I did tell patient that he will need insulin  to be restarted at discharge.  He verbalized understanding. Attempted to call patient's wife x2 but there was no answer.  Will follow up on 11/22/23.   Addendum 1300-  Spoke with patient's wife.  She states that patient saw new MD at [facility] in [place] last week.  They had a Diabetes reversal program and told patient to stop taking insulin , start metformin , only eat 36 grams of CHO daily, only drink water, and walk 10 minutes after every meal.  She states that patient started feeling poorly on Friday.  We briefly discussed that he will need insulin  resumed and the basic physiology of insulin  and how it takes glucose and puts in the cells.  Will  re-visit with patient on 11/22/23.   Thanks,  Randall Bullocks, RN, BC-ADM Inpatient Diabetes Coordinator Pager [phone]  (8a-5p)

## 2023-11-21 NOTE — Progress Notes (Signed)
 PROGRESS NOTE    Bruce Brandt  FMW:[id number] DOB: Jun 05, 1970 DOA: 11/20/2023 PCP: Antonette Angeline ORN, NP  Outpatient Specialists: none    Brief Narrative:    Bruce Brandt is a 53 y.o. male with medical history significant for Insulin -dependent type 2 diabetes being admitted with DKA in the setting of having a medication change about 4 days ago.  His insulin  appears to have been stopped in favor of metformin  which she has not yet started.  On the day of arrival he developed nausea and vomiting and increased thirst.  According to wife at the bedside, he also developed severe low back pain about 4 days ago along with constipation.  No prior history of constipation or chronic low back pain.  He has had no fever or chills, cough and denies abdominal pain.    Assessment & Plan:   Principal Problem:   Diabetic ketoacidosis without coma associated with type 2 diabetes mellitus (HCC) Active Problems:   Type 2 diabetes mellitus with hyperglycemia, without long-term current use of insulin  (HCC)   Leukocytosis   Low back pain  # T2DM, uncontrolled # DKA 2/2 poor compliance with home insulin  and then recent decision to stop insulin  altogether. Carries diagnosis of LADA and A1c is 12 so insulin  clearly indicated, patient and wife aware. Severe DKA on arrival, improving. Still with nausea, back pain. 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 Ongoing migraine  Objective: Vitals:   11/21/23 0600 11/21/23 0700 11/21/23 0800 11/21/23 0808  BP: 122/76 112/66 109/61 109/60  Pulse: (!) 103 94 95 97  Resp: (!) 23 11 13 16   Temp:    97.6 F (36.4 C)  TempSrc:    Oral  SpO2: 94% 93% 94% 95%  Weight:      Height:        Intake/Output Summary (Last 24 hours) at 11/21/2023 0956 Last data filed at 11/21/2023 0700 Gross per 24 hour  Intake 4193.72 ml  Output 3900 ml  Net 293.72 ml   Filed Weights   11/20/23 0029 11/20/23 1057  Weight: 94.8 kg 93.1 kg    Examination:  General exam: Appears in mild distress Respiratory system: Clear to auscultation. Respiratory effort normal. Cardiovascular system: S1 & S2 heard, RRR  Gastrointestinal system: Abdomen is nondistended, soft and nontender. N  Central nervous system: Alert and oriented. No focal neurological deficits. Extremities: Symmetric 5 x 5 power. Skin: No rashes, lesions or ulcers Psychiatry: Judgement and insight appear normal. Mood & affect appropriate.     Data Reviewed: I have personally reviewed following labs and imaging studies  CBC: Recent Labs  Lab 11/20/23 0103  WBC 19.3*  NEUTROABS 16.5*  HGB 17.0  HCT 54.7*  MCV 94.1  PLT 242   Basic Metabolic Panel: Recent Labs  Lab 11/20/23 0103 11/20/23 0433 11/20/23 0815 11/20/23 1350 11/20/23 2021 11/21/23 0250 11/21/23 0741  NA 139   < > 134* 139 137 141 141  K 4.7   < > 4.1 3.7 3.5 3.6 3.5  CL 106   < > 109 110 112* 111 112*  CO2 7*   < > 12* 14* 14* 15* 21*  GLUCOSE 325*   < > 163* 173* 159* 154* 141*  BUN 31*   < > 19 17 14 12 10   CREATININE 1.40*   < > 1.17 0.89 0.82 0.75 0.64  CALCIUM  8.8*   < > 8.4* 8.5* 8.4* 8.4* 8.5*  MG 2.6*  --   --   --   --   --   --    < > = values in this interval not displayed.   GFR: Estimated Creatinine Clearance: 117.2  mL/min (by C-G formula based on SCr of 0.64 mg/dL). Liver Function Tests: Recent Labs  Lab 11/20/23 0103  AST 19  ALT 23  ALKPHOS 81  BILITOT 1.6*  PROT 8.5*  ALBUMIN 5.1*   Recent Labs  Lab 11/20/23 0103  LIPASE 286*   No results for input(s): AMMONIA in the last 168 hours. Coagulation Profile: No results for input(s): INR, PROTIME in the last 168 hours. Cardiac Enzymes: No results for input(s): CKTOTAL, CKMB, CKMBINDEX, TROPONINI in the last 168 hours. BNP (last 3 results) No results for input(s): PROBNP in the last 8760 hours. HbA1C: Recent Labs    11/20/23 0433  HGBA1C 12.1*   CBG: Recent Labs  Lab 11/20/23 2333 11/21/23 0129 11/21/23 0327 11/21/23 0525 11/21/23 0722  GLUCAP 157* 147* 146* 165* 133*   Lipid Profile: No results for input(s): CHOL, HDL, LDLCALC, TRIG, CHOLHDL, LDLDIRECT in the last 72 hours. Thyroid Function Tests: No results for input(s): TSH, T4TOTAL, FREET4, T3FREE, THYROIDAB in the last 72 hours. Anemia Panel: No results for input(s): VITAMINB12, FOLATE, FERRITIN, TIBC, IRON, RETICCTPCT in the last 72 hours. Urine analysis:    Component Value Date/Time   COLORURINE YELLOW (A) 11/20/2023 0404   APPEARANCEUR HAZY (A) 11/20/2023 0404   LABSPEC 1.022 11/20/2023 0404   PHURINE 5.0 11/20/2023 0404   GLUCOSEU >=500 (A) 11/20/2023 0404   HGBUR SMALL (A) 11/20/2023 0404   BILIRUBINUR NEGATIVE 11/20/2023 0404   BILIRUBINUR negative 05/10/2019 0819   KETONESUR 80 (A) 11/20/2023 0404   PROTEINUR 100 (A) 11/20/2023 0404   UROBILINOGEN 0.2 05/10/2019 0819   NITRITE NEGATIVE 11/20/2023 0404   LEUKOCYTESUR NEGATIVE 11/20/2023 0404   Sepsis Labs: @LABRCNTIP (procalcitonin:4,lacticidven:4)  ) Recent Results (from the past 240 hours)  Blood culture (routine x 2)     Status: None (Preliminary result)   Collection Time: 11/20/23  4:33 AM   Specimen: BLOOD  Result Value Ref Range Status    Specimen Description BLOOD BLOOD RIGHT ARM  Final   Special Requests   Final    BOTTLES DRAWN AEROBIC AND ANAEROBIC Blood Culture adequate volume   Culture  Setup Time PENDING  Incomplete   Culture   Final    NO GROWTH 1 DAY Performed at [facility], [address] [place], [place] [address]    Report Status PENDING  Incomplete  Blood culture (routine x 2)     Status: None (Preliminary result)   Collection Time: 11/20/23  4:33 AM   Specimen: BLOOD  Result Value Ref Range Status   Specimen Description BLOOD BLOOD LEFT ARM  Final   Special Requests   Final    BOTTLES DRAWN AEROBIC AND ANAEROBIC Blood Culture adequate volume   Culture  Final    NO GROWTH 1 DAY Performed at [facility], [address] [place], [place] [address]    Report Status PENDING  Incomplete  MRSA Next Gen by PCR, Nasal     Status: None   Collection Time: 11/20/23 11:01 AM   Specimen: Nasal Mucosa; Nasal Swab  Result Value Ref Range Status   MRSA by PCR Next Gen NOT DETECTED NOT DETECTED Final    Comment: (NOTE) The GeneXpert MRSA Assay (FDA approved for NASAL specimens only), is one component of a comprehensive MRSA colonization surveillance program. It is not intended to diagnose MRSA infection nor to guide or monitor treatment for MRSA infections. Test performance is not FDA approved in patients less than 63 years old. Performed at [facility], [address] [place], [place] [address]          Radiology Studies: CT L-SPINE NO CHARGE Result Date: 11/20/2023 CLINICAL DATA:  Back pain EXAM: CT Lumbar Spine with contrast TECHNIQUE: Technique: Multiplanar CT images of the lumbar spine were reconstructed from contemporary CT of the Abdomen and Pelvis. RADIATION DOSE REDUCTION: This exam was performed according to the departmental dose-optimization program which includes automated exposure control, adjustment of the mA and/or kV according to patient size and/or use of  iterative reconstruction technique. CONTRAST:  No additional COMPARISON:  None Available. FINDINGS: Segmentation: 5 lumbar type vertebral bodies are well visualized. Alignment: Alignment is within normal limits. Vertebrae: Vertebral body height is well maintained. No anterolisthesis is seen. Osteophytic changes are noted posteriorly at T12-L1 and L1-L2. Anterior osteophytes are noted as well. Facet hypertrophic changes are seen. No acute fracture or acute facet abnormality is noted. Visualize ribcage is within normal limits. Paraspinal and other soft tissues: Paraspinal tissues are within normal limits. Disc levels: No significant disc pathology is noted. IMPRESSION: Degenerative change without acute abnormality. Electronically Signed   By: Oneil Devonshire M.D.   On: 11/20/2023 03:46   CT CHEST ABDOMEN PELVIS W CONTRAST Result Date: 11/20/2023 CLINICAL DATA:  DKA with nausea and vomiting EXAM: CT CHEST, ABDOMEN, AND PELVIS WITH CONTRAST TECHNIQUE: Multidetector CT imaging of the chest, abdomen and pelvis was performed following the standard protocol during bolus administration of intravenous contrast. RADIATION DOSE REDUCTION: This exam was performed according to the departmental dose-optimization program which includes automated exposure control, adjustment of the mA and/or kV according to patient size and/or use of iterative reconstruction technique. CONTRAST:  OMNIPAQUE  IOHEXOL  300 MG/ML  SOLN COMPARISON:  02/24/2022 FINDINGS: CT CHEST FINDINGS Cardiovascular: Aorta and its branches are within normal limits. No cardiac enlargement is seen. Pulmonary artery is within normal limits. Mediastinum/Nodes: Thoracic inlet is unremarkable. No hilar or mediastinal adenopathy is noted. The esophagus as visualized is within normal limits. Lungs/Pleura: Lungs are well aerated bilaterally. No focal infiltrate or effusion is noted. Tiny subpleural nodule is noted in the right lower lobe measuring 3 mm. No other nodules  are noted. Musculoskeletal: No chest wall mass or suspicious bone lesions identified. CT ABDOMEN PELVIS FINDINGS Hepatobiliary: No focal liver abnormality is seen. No gallstones, gallbladder wall thickening, or biliary dilatation. Pancreas: Unremarkable. No pancreatic ductal dilatation or surrounding inflammatory changes. Spleen: Normal in size without focal abnormality. Adrenals/Urinary Tract: Left adrenal gland again demonstrates small nodules. These are most consistent with adenomas, stable from the prior exam. Right adrenal gland is within normal limits. No renal calculi or obstructive changes are noted. Normal enhancement is seen. The bladder is well distended. Stomach/Bowel: The appendix is not well visualized.  No inflammatory changes to suggest appendicitis are noted. No obstructive or inflammatory change of the colon is seen. Stomach is distended fluid. Small bowel is unremarkable. Vascular/Lymphatic: No significant vascular findings are present. No enlarged abdominal or pelvic lymph nodes. Reproductive: Prostate is unremarkable. Other: No abdominal wall hernia or abnormality. No abdominopelvic ascites. Musculoskeletal: Mild degenerative changes of lumbar spine are noted. IMPRESSION: CT of the chest:Right solid pulmonary nodule measuring 3 mm. Per Fleischner Society Guidelines, no routine follow-up imaging is recommended. These guidelines do not apply to immunocompromised patients and patients with cancer. Follow up in patients with significant comorbidities as clinically warranted. For lung cancer screening, adhere to Lung-RADS guidelines. Reference: Radiology. 2017; 284(1):228-43. No other focal abnormality is noted. CT of the abdomen and pelvis: Stable left adrenal nodules consistent with small adenomas. No acute abnormality noted. Electronically Signed   By: Oneil Devonshire M.D.   On: 11/20/2023 03:44   DG Chest Portable 1 View Result Date: 11/20/2023 CLINICAL DATA:  Nausea and vomiting EXAM: PORTABLE  CHEST 1 VIEW COMPARISON:  None Available. FINDINGS: The heart size and mediastinal contours are within normal limits. Both lungs are clear. The visualized skeletal structures are unremarkable. IMPRESSION: No active disease. Electronically Signed   By: Oneil Devonshire M.D.   On: 11/20/2023 02:07        Scheduled Meds:  aspirin-acetaminophen -caffeine  2 tablet Oral Once   Chlorhexidine Gluconate Cloth  6 each Topical Q0600   enoxaparin (LOVENOX) injection  40 mg Subcutaneous Q24H   metoCLOPramide  (REGLAN ) injection  10 mg Intravenous Q8H   potassium chloride  60 mEq Oral Once   Continuous Infusions:  dextrose 5% lactated ringers  with KCl 20 mEq/L 175 mL/hr at 11/21/23 0700   insulin  1.7 Units/hr (11/21/23 0925)     LOS: 1 day     Devaughn KATHEE Ban, MD [facility]   If 7PM-7AM, please contact night-coverage [email or web address] Password [email or web address] 11/21/2023, 9:56 AM

## 2023-11-22 ENCOUNTER — Inpatient Hospital Stay

## 2023-11-22 DIAGNOSIS — E1110 Type 2 diabetes mellitus with ketoacidosis without coma: Secondary | ICD-10-CM | POA: Diagnosis not present

## 2023-11-22 LAB — GLUCOSE, CAPILLARY
Glucose-Capillary: 142 mg/dL — ABNORMAL HIGH (ref 70–99)
Glucose-Capillary: 147 mg/dL — ABNORMAL HIGH (ref 70–99)
Glucose-Capillary: 147 mg/dL — ABNORMAL HIGH (ref 70–99)
Glucose-Capillary: 148 mg/dL — ABNORMAL HIGH (ref 70–99)
Glucose-Capillary: 152 mg/dL — ABNORMAL HIGH (ref 70–99)
Glucose-Capillary: 154 mg/dL — ABNORMAL HIGH (ref 70–99)
Glucose-Capillary: 154 mg/dL — ABNORMAL HIGH (ref 70–99)
Glucose-Capillary: 159 mg/dL — ABNORMAL HIGH (ref 70–99)
Glucose-Capillary: 161 mg/dL — ABNORMAL HIGH (ref 70–99)
Glucose-Capillary: 164 mg/dL — ABNORMAL HIGH (ref 70–99)
Glucose-Capillary: 170 mg/dL — ABNORMAL HIGH (ref 70–99)
Glucose-Capillary: 173 mg/dL — ABNORMAL HIGH (ref 70–99)
Glucose-Capillary: 184 mg/dL — ABNORMAL HIGH (ref 70–99)

## 2023-11-22 LAB — BETA-HYDROXYBUTYRIC ACID: Beta-Hydroxybutyric Acid: 2.86 mmol/L — ABNORMAL HIGH (ref 0.05–0.27)

## 2023-11-22 LAB — COMPREHENSIVE METABOLIC PANEL WITH GFR
ALT: 13 U/L (ref 0–44)
AST: 13 U/L — ABNORMAL LOW (ref 15–41)
Albumin: 3.4 g/dL — ABNORMAL LOW (ref 3.5–5.0)
Alkaline Phosphatase: 52 U/L (ref 38–126)
Anion gap: 6 (ref 5–15)
BUN: <5 mg/dL — ABNORMAL LOW (ref 6–20)
CO2: 28 mmol/L (ref 22–32)
Calcium: 8.2 mg/dL — ABNORMAL LOW (ref 8.9–10.3)
Chloride: 108 mmol/L (ref 98–111)
Creatinine, Ser: 0.47 mg/dL — ABNORMAL LOW (ref 0.61–1.24)
GFR, Estimated: >60 mL/min (ref >60)
Glucose, Bld: 161 mg/dL — ABNORMAL HIGH (ref 70–99)
Potassium: 3.4 mmol/L — ABNORMAL LOW (ref 3.5–5.1)
Sodium: 142 mmol/L (ref 135–145)
Total Bilirubin: 1.1 mg/dL (ref 0.0–1.2)
Total Protein: 5.7 g/dL — ABNORMAL LOW (ref 6.5–8.1)

## 2023-11-22 LAB — BASIC METABOLIC PANEL WITH GFR
Anion gap: 8 (ref 5–15)
BUN: 6 mg/dL (ref 6–20)
CO2: 23 mmol/L (ref 22–32)
Calcium: 8.3 mg/dL — ABNORMAL LOW (ref 8.9–10.3)
Chloride: 111 mmol/L (ref 98–111)
Creatinine, Ser: 0.57 mg/dL — ABNORMAL LOW (ref 0.61–1.24)
GFR, Estimated: >60 mL/min (ref >60)
Glucose, Bld: 160 mg/dL — ABNORMAL HIGH (ref 70–99)
Potassium: 3.3 mmol/L — ABNORMAL LOW (ref 3.5–5.1)
Sodium: 142 mmol/L (ref 135–145)

## 2023-11-22 LAB — LIPASE, BLOOD: Lipase: 25 U/L (ref 11–51)

## 2023-11-22 MED ORDER — CHLORHEXIDINE GLUCONATE CLOTH 2 % EX PADS
6.0000 | MEDICATED_PAD | CUTANEOUS | Status: AC
  Administered 2023-11-23: 6 via TOPICAL

## 2023-11-22 MED ORDER — OXYCODONE HCL 5 MG PO TABS: 5.0000 mg | ORAL_TABLET | Freq: Four times a day (QID) | ORAL | Status: DC | PRN

## 2023-11-22 MED ORDER — POTASSIUM CHLORIDE CRYS ER 20 MEQ PO TBCR
60.0000 meq | EXTENDED_RELEASE_TABLET | Freq: Once | ORAL | Status: AC
  Administered 2023-11-22: 60 meq via ORAL
  Filled 2023-11-22: qty 3

## 2023-11-22 MED ORDER — INSULIN ASPART 100 UNIT/ML IJ SOLN
0.0000 [IU] | INTRAMUSCULAR | Status: DC
  Administered 2023-11-22 – 2023-11-23 (×3): 2 [IU] via SUBCUTANEOUS
  Administered 2023-11-23: 3 [IU] via SUBCUTANEOUS
  Administered 2023-11-23: 2 [IU] via SUBCUTANEOUS
  Filled 2023-11-22 (×5): qty 1

## 2023-11-22 MED ORDER — INSULIN GLARGINE 100 UNIT/ML ~~LOC~~ SOLN
14.0000 [IU] | Freq: Every day | SUBCUTANEOUS | Status: DC
Start: 2023-11-22 — End: 2023-11-23
  Administered 2023-11-22 – 2023-11-23 (×2): 14 [IU] via SUBCUTANEOUS
  Filled 2023-11-22 (×3): qty 0.14

## 2023-11-22 NOTE — Inpatient Diabetes Management (Signed)
 Inpatient Diabetes Program Recommendations  AACE/ADA: New Consensus Statement on Inpatient Glycemic Control (2015)  Target Ranges:  Prepandial:   less than 140 mg/dL      Peak postprandial:   less than 180 mg/dL (1-2 hours)      Critically ill patients:  140 - 180 mg/dL   Lab Results  Component Value Date   GLUCAP 184 (H) 11/22/2023   HGBA1C 12.1 (H) 11/20/2023    Review of Glycemic Control  Latest Reference Range & Units 11/21/23 22:24 11/22/23 00:37 11/22/23 03:04 11/22/23 05:24 11/22/23 07:21 11/22/23 09:37  Glucose-Capillary 70 - 99 mg/dL 838 (H) 857 (H) 845 (H) 152 (H) 147 (H) 184 (H)   Diabetes history: DM Outpatient Diabetes medications:  None-stopped taking insulin  4 days ago per MD order Previously was taking Humalog 75/25 20 units bid (wife states he has insulin  at home) Current orders for Inpatient glycemic control:  IV insulin -  Inpatient Diabetes Program Recommendations:    Saw patient again at bedside.  He states he has a severe headache and is nauseas this morning. Patient actively vomiting while I was in the room.  Briefly discussed need for insulin  again. He verbalized understanding.   He has worn sensor in the past and I encouraged him to restart it prior to discharge home.   -Once patient is ready for transition off insulin  drip, consider Lantus 14 units 2 hours prior to d/c of insulin  drip.  Also consider Novolog  0-9 untis q 4 hours.   At discharge, he will likely need to resume Humalog 75/25 as patient prefers only 2 shots per day.  Wife is interested in patient seeing Diabetes MD in [place] (they live in [place]).  I will attach to d/c paperwork.    Thanks,  Randall Bullocks, RN, BC-ADM Inpatient Diabetes Coordinator Pager [phone]  (8a-5p)

## 2023-11-22 NOTE — Discharge Instructions (Signed)
 Please follow up with your primary doctor in 1-2 weeks to recheck your blood sugars and discuss further changes to your insulin  and medication regimen.     LOCAL ENDOCRINOLOGIST: [facility] [phone]) Dr. Lela Fendt Dr. Obadiah [place] Dr. [place] Thapa Dr. Donell "Abby" [facility] Endocr[facility]ology [phone]) Dr. Reyes Alexander Dr. Odella Jacobson [facility] [phone]) Dr. Littie Caffey Dr. Floreen Josephina Harlene CHRISTELLA. Roy, FNP [facility] [phone]) Dr. Garnette Nichole [facility] [phone]) [[place] office] [phone])  Dr. Eleanor Leep Dr. Debby Breaker [facility] [phone]) Dr. Carlos CHARM Rao Autumn Hudnall Bonney), PA Dr. Elizbeth Blanch Dr. Elsie Claudene Raddle. Elodia Pouch, PA [facility]) [phone] [place]) [phone]) Dr. Donnice Dale Warren Hyacinth, NP Pediatric Endocrinology [phone]) Jeannene Penton, NP Dr. Dorothyann Orange Aline Lowers, NP [facility] [phone]) Dr. Ranny Lenis Benton Therisa, FNP [facility] [phone]) Dr. Marce Rucks   Ask Dr. Caffey about the OmniPod 5 Insulin  Pump [email or web address]://[email or web address].com/what-is-omnipod/[email or web address]  [email or web address]://[email or web address].com/sites/default/files/[email or web address]_[email or web address]_Adult_Brochure_US_English.pdf

## 2023-11-22 NOTE — Plan of Care (Signed)

## 2023-11-22 NOTE — Progress Notes (Signed)
 PROGRESS NOTE    BANNON GIAMMARCO  FMW:[id number] DOB: [birth year] DOA: 11/20/2023 PCP: Antonette Angeline ORN, NP  Outpatient Specialists: none    Brief Narrative:    RYANE CANAVAN is a 53 y.o. male with medical history significant for Insulin -dependent type 2 diabetes being admitted with DKA in the setting of having a medication change about 4 days ago.  His insulin  appears to have been stopped in favor of metformin  which she has not yet started.  On the day of arrival he developed nausea and vomiting and increased thirst.  According to wife at the bedside, he also developed severe low back pain about 4 days ago along with constipation.  No prior history of constipation or chronic low back pain.  He has had no fever or chills, cough and denies abdominal pain.    Assessment & Plan:   Principal Problem:   Diabetic ketoacidosis without coma associated with type 2 diabetes mellitus (HCC) Active Problems:   Type 2 diabetes mellitus with hyperglycemia, without long-term current use of insulin  (HCC)   Leukocytosis   Low back pain  # T2DM, uncontrolled # DKA 2/2 poor compliance with home insulin  and then recent decision to stop insulin  altogether. Carries diagnosis of LADA and A1c is 12 so insulin  clearly indicated, patient and wife aware. Severe DKA on arrival, improving. Still with nausea, back pain, vomited once this morning. Beta-h elevated but gap closed and bicarb wnl, think safe to transition to sub-q insulin  - continue fluids - lantus 14, ssi q4 until eating regularly - start diet assuming no signs ileus/obstruction as below - dm following, recs appreciated  # Bilious emesis Green emesis today. Possible ileus? No abd distention. No stool but passing flatus - KUB, would repeat CT if abnormal - f/u cmp, lipase  # Migraine, chronic Reports longstanding history of migraine with almost daily headache, currently has migraine headache, nausea - continue standing reglan   # Adrenal  incidentaloma - outpt eval   DVT prophylaxis: lovenox Code Status: full Family Communication: wife at bedside 10/14  Level of care: Stepdown Status is: Inpatient Remains inpatient appropriate because: severity of illness    Consultants:  none  Procedures: none  Antimicrobials:  none    Subjective: Emesis once this morning, ongoing nausea. Bit more energy  Objective: Vitals:   11/22/23 1125 11/22/23 1145 11/22/23 1200 11/22/23 1300  BP:  (!) 149/94 (!) 156/86 (!) 147/84  Pulse: 85 84 89 76  Resp: 13 12 17 12   Temp:  98.6 F (37 C)    TempSrc:  Oral    SpO2: 93% 95% 94% 93%  Weight:      Height:        Intake/Output Summary (Last 24 hours) at 11/22/2023 1400 Last data filed at 11/22/2023 1238 Gross per 24 hour  Intake 3880.09 ml  Output 3215 ml  Net 665.09 ml   Filed Weights   11/20/23 0029 11/20/23 1057  Weight: 94.8 kg 93.1 kg    Examination:  General exam: Appears in mild distress Respiratory system: Clear to auscultation. Respiratory effort normal. Cardiovascular system: S1 & S2 heard, RRR  Gastrointestinal system: Abdomen is nondistended, soft and nontender. Decreased bowel sounds throughout Central nervous system: Alert and oriented. No focal neurological deficits. Extremities: Symmetric 5 x 5 power. Skin: No rashes, lesions or ulcers Psychiatry: Judgement and insight appear normal. Mood & affect appropriate.     Data Reviewed: I have personally reviewed following labs and imaging studies  CBC: Recent Labs  Lab  11/20/23 0103  WBC 19.3*  NEUTROABS 16.5*  HGB 17.0  HCT 54.7*  MCV 94.1  PLT 242   Basic Metabolic Panel: Recent Labs  Lab 11/20/23 0103 11/20/23 0433 11/20/23 2021 11/21/23 0250 11/21/23 0741 11/21/23 1406 11/22/23 0427  NA 139   < > 137 141 141 140 142  K 4.7   < > 3.5 3.6 3.5 3.6 3.3*  CL 106   < > 112* 111 112* 113* 111  CO2 7*   < > 14* 15* 21* 18* 23  GLUCOSE 325*   < > 159* 154* 141* 145* 160*  BUN 31*    < > 14 12 10 9 6   CREATININE 1.40*   < > 0.82 0.75 0.64 0.56* 0.57*  CALCIUM  8.8*   < > 8.4* 8.4* 8.5* 8.6* 8.3*  MG 2.6*  --   --   --   --   --   --    < > = values in this interval not displayed.   GFR: Estimated Creatinine Clearance: 117.2 mL/min (A) (by C-G formula based on SCr of 0.57 mg/dL (L)). Liver Function Tests: Recent Labs  Lab 11/20/23 0103  AST 19  ALT 23  ALKPHOS 81  BILITOT 1.6*  PROT 8.5*  ALBUMIN 5.1*   Recent Labs  Lab 11/20/23 0103  LIPASE 286*   No results for input(s): AMMONIA in the last 168 hours. Coagulation Profile: No results for input(s): INR, PROTIME in the last 168 hours. Cardiac Enzymes: No results for input(s): CKTOTAL, CKMB, CKMBINDEX, TROPONINI in the last 168 hours. BNP (last 3 results) No results for input(s): PROBNP in the last 8760 hours. HbA1C: Recent Labs    11/20/23 0433  HGBA1C 12.1*   CBG: Recent Labs  Lab 11/22/23 0721 11/22/23 0937 11/22/23 1123 11/22/23 1237 11/22/23 1336  GLUCAP 147* 184* 159* 161* 173*   Lipid Profile: No results for input(s): CHOL, HDL, LDLCALC, TRIG, CHOLHDL, LDLDIRECT in the last 72 hours. Thyroid Function Tests: No results for input(s): TSH, T4TOTAL, FREET4, T3FREE, THYROIDAB in the last 72 hours. Anemia Panel: No results for input(s): VITAMINB12, FOLATE, FERRITIN, TIBC, IRON, RETICCTPCT in the last 72 hours. Urine analysis:    Component Value Date/Time   COLORURINE YELLOW (A) 11/20/2023 0404   APPEARANCEUR HAZY (A) 11/20/2023 0404   LABSPEC 1.022 11/20/2023 0404   PHURINE 5.0 11/20/2023 0404   GLUCOSEU >=500 (A) 11/20/2023 0404   HGBUR SMALL (A) 11/20/2023 0404   BILIRUBINUR NEGATIVE 11/20/2023 0404   BILIRUBINUR negative 05/10/2019 0819   KETONESUR 80 (A) 11/20/2023 0404   PROTEINUR 100 (A) 11/20/2023 0404   UROBILINOGEN 0.2 05/10/2019 0819   NITRITE NEGATIVE 11/20/2023 0404   LEUKOCYTESUR NEGATIVE 11/20/2023 0404   Sepsis  Labs: @LABRCNTIP (procalcitonin:4,lacticidven:4)  ) Recent Results (from the past 240 hours)  Blood culture (routine x 2)     Status: None (Preliminary result)   Collection Time: 11/20/23  4:33 AM   Specimen: BLOOD  Result Value Ref Range Status   Specimen Description BLOOD BLOOD RIGHT ARM  Final   Special Requests   Final    BOTTLES DRAWN AEROBIC AND ANAEROBIC Blood Culture adequate volume   Culture   Final    NO GROWTH 2 DAYS Performed at [facility], [address], [place], [place] [address]    Report Status PENDING  Incomplete  Blood culture (routine x 2)     Status: None (Preliminary result)   Collection Time: 11/20/23  4:33 AM   Specimen: BLOOD  Result  Value Ref Range Status   Specimen Description BLOOD BLOOD LEFT ARM  Final   Special Requests   Final    BOTTLES DRAWN AEROBIC AND ANAEROBIC Blood Culture adequate volume   Culture   Final    NO GROWTH 2 DAYS Performed at [facility], [address] [place], [place] [address]    Report Status PENDING  Incomplete  MRSA Next Gen by PCR, Nasal     Status: None   Collection Time: 11/20/23 11:01 AM   Specimen: Nasal Mucosa; Nasal Swab  Result Value Ref Range Status   MRSA by PCR Next Gen NOT DETECTED NOT DETECTED Final    Comment: (NOTE) The GeneXpert MRSA Assay (FDA approved for NASAL specimens only), is one component of a comprehensive MRSA colonization surveillance program. It is not intended to diagnose MRSA infection nor to guide or monitor treatment for MRSA infections. Test performance is not FDA approved in patients less than 4 years old. Performed at [facility], [address], [place], [place] [address]          Radiology Studies: No results found.       Scheduled Meds:  Chlorhexidine Gluconate Cloth  6 each Topical Q0600   enoxaparin (LOVENOX) injection  40 mg Subcutaneous Q24H   insulin  aspart  0-9 Units Subcutaneous Q4H   insulin  glargine  14 Units  Subcutaneous Daily   metoCLOPramide  (REGLAN ) injection  10 mg Intravenous Q8H   Continuous Infusions:  dextrose 5% lactated ringers  with KCl 20 mEq/L 175 mL/hr at 11/22/23 1238     LOS: 2 days     Devaughn KATHEE Ban, MD [facility]   If 7PM-7AM, please contact night-coverage [email or web address] Password [email or web address] 11/22/2023, 2:00 PM

## 2023-11-23 DIAGNOSIS — E1110 Type 2 diabetes mellitus with ketoacidosis without coma: Secondary | ICD-10-CM | POA: Diagnosis not present

## 2023-11-23 LAB — GLUCOSE, CAPILLARY
Glucose-Capillary: 188 mg/dL — ABNORMAL HIGH (ref 70–99)
Glucose-Capillary: 221 mg/dL — ABNORMAL HIGH (ref 70–99)
Glucose-Capillary: 195 mg/dL — ABNORMAL HIGH (ref 70–99)
Glucose-Capillary: 226 mg/dL — ABNORMAL HIGH (ref 70–99)
Glucose-Capillary: 197 mg/dL — ABNORMAL HIGH (ref 70–99)

## 2023-11-23 LAB — BASIC METABOLIC PANEL WITH GFR
Anion gap: 11 (ref 5–15)
BUN: 7 mg/dL (ref 6–20)
CO2: 25 mmol/L (ref 22–32)
Calcium: 8.4 mg/dL — ABNORMAL LOW (ref 8.9–10.3)
Chloride: 103 mmol/L (ref 98–111)
Creatinine, Ser: 0.5 mg/dL — ABNORMAL LOW (ref 0.61–1.24)
GFR, Estimated: >60 mL/min (ref >60)
Glucose, Bld: 208 mg/dL — ABNORMAL HIGH (ref 70–99)
Potassium: 3.3 mmol/L — ABNORMAL LOW (ref 3.5–5.1)
Sodium: 139 mmol/L (ref 135–145)

## 2023-11-23 MED ORDER — INSULIN ASPART 100 UNIT/ML IJ SOLN
0.0000 [IU] | Freq: Three times a day (TID) | INTRAMUSCULAR | Status: DC
  Administered 2023-11-23: 5 [IU] via SUBCUTANEOUS
  Administered 2023-11-24: 2 [IU] via SUBCUTANEOUS
  Filled 2023-11-23 (×2): qty 1

## 2023-11-23 MED ORDER — POTASSIUM CHLORIDE CRYS ER 20 MEQ PO TBCR
60.0000 meq | EXTENDED_RELEASE_TABLET | Freq: Once | ORAL | Status: AC
  Administered 2023-11-23: 60 meq via ORAL
  Filled 2023-11-23: qty 3

## 2023-11-23 MED ORDER — INSULIN ASPART 100 UNIT/ML IJ SOLN: 0.0000 [IU] | Freq: Every day | INTRAMUSCULAR | Status: DC

## 2023-11-23 MED ORDER — INSULIN GLARGINE 100 UNIT/ML ~~LOC~~ SOLN
18.0000 [IU] | Freq: Every day | SUBCUTANEOUS | Status: DC
Start: 2023-11-24 — End: 2023-11-24
  Administered 2023-11-24: 18 [IU] via SUBCUTANEOUS
  Filled 2023-11-23: qty 0.18

## 2023-11-23 NOTE — Plan of Care (Signed)

## 2023-11-23 NOTE — Inpatient Diabetes Management (Addendum)
 Inpatient Diabetes Program Recommendations  AACE/ADA: New Consensus Statement on Inpatient Glycemic Control (2015)  Target Ranges:  Prepandial:   less than 140 mg/dL      Peak postprandial:   less than 180 mg/dL (1-2 hours)      Critically ill patients:  140 - 180 mg/dL    Latest Reference Range & Units 11/20/23 04:33  Hemoglobin A1C 4.8 - 5.6 % 12.1 (H)  300 mg/dl  (H): Data is abnormally high  Latest Reference Range & Units 11/22/23 11:23 11/22/23 12:37 11/22/23 13:36 11/22/23 14:52 11/22/23 15:56 11/22/23 16:59 11/22/23 19:35  Glucose-Capillary 70 - 99 mg/dL 840 (H)  IV Insulin  Drip Running 161 (H) 173 (H) 147 (H)  14 units Lantus @1525  154 (H) 148 (H)  IV Insulin  Drip Stopped @1725  170 (H)  2 units Novolog    (H): Data is abnormally high  Latest Reference Range & Units 11/22/23 23:23 11/23/23 03:51 11/23/23 07:31  Glucose-Capillary 70 - 99 mg/dL 835 (H)  2 units Novolog   188 (H)  2 units Novolog   221 (H)  3 units Novolog    (H): Data is abnormally high  Admit with: DKA after stopping Insulin  at home (stopped insulin  4 days PTA per PCP orders)  History: DM (Labeled as LADA in ENDO notes from Aug 2025)  Home DM Meds: Freestyle Libre 3 CGM       Humalog 75/25 Insulin  10-14 units BID       Jardiance  25 mg daily       Mounjaro 2.5 mg Qweek          Current Orders: Lantus 14 units daily     Novolog  Sensitive Correction Scale/ SSI (0-9 units) Q4H    ENDO: Dr. Tommas Last Seen 09/30/2023   MD- Please consider:  Increase Lantus to 18 units daily (0.2 units/kg)  At discharge, he will likely need to resume Humalog 75/25 as patient prefers only 2 shots per day    Addendum 11:45am--Met w/ pt and wife at bedside this AM.  Pt was lying on side and told me he was able to eat last night and little bit this AM but gets very nauseated and gets the dry heaves when he sits up.  Reviewed with pt and wife that we have transitioned him back to SQ Insulin  injections.  Also  reviewed with pt and wife that both ENDO notes (Dr. Tommas) and hospital Attending MD notes list pt as LADA--Explained what LADA means and that since he cannot produce sufficient insulin  and is ketosis prone, that he will need insulin  injections for life.  Pt agreeable to restarting insulin  at home.  Uses the Freestyle Libre 3 CGM for home glucose monitoring--Uses the app on his phone.  Gave pt's wife 1 free sample of the FSL3 to start at home when pt discharged.  Asked pt to please contact ENDO office to make follow up appt after d/c.  Pt and wife asked me about an insulin  pump for home.  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  Pt and wife very appreciative of info.      --Will follow patient during hospitalization--  Adina Rudolpho Arrow RN, MSN, CDCES Diabetes Coordinator Inpatient Glycemic Control Team Team Pager: [phone] (8a-5p)

## 2023-11-23 NOTE — Progress Notes (Deleted)
 Patient arrived from procedure alert and oriented, Left groin site, soft, clean dry and intact, Left pulse 2+

## 2023-11-23 NOTE — Progress Notes (Signed)
 PROGRESS NOTE    Bruce Brandt  FMW:[id number] DOB: [birth year] DOA: 11/20/2023 PCP: Antonette Angeline ORN, NP  Outpatient Specialists: none    Brief Narrative:    Bruce Brandt is a 53 y.o. male with medical history significant for Insulin -dependent type 2 diabetes being admitted with DKA in the setting of having a medication change about 4 days ago.  His insulin  appears to have been stopped in favor of metformin  which she has not yet started.  On the day of arrival he developed nausea and vomiting and increased thirst.  According to wife at the bedside, he also developed severe low back pain about 4 days ago along with constipation.  No prior history of constipation or chronic low back pain.  He has had no fever or chills, cough and denies abdominal pain.    Assessment & Plan:   Principal Problem:   Diabetic ketoacidosis without coma associated with type 2 diabetes mellitus (HCC) Active Problems:   Type 2 diabetes mellitus with hyperglycemia, without long-term current use of insulin  (HCC)   Leukocytosis   Low back pain  # T2DM, uncontrolled # DKA 2/2 poor compliance with home insulin  and then recent decision to stop insulin  altogether. Carries diagnosis of LADA and A1c is 12 so insulin  clearly indicated, patient and wife aware. Severe DKA on arrival, finally much improved. Off IV insulin , today good PO so will stop fluids and transition to SSI with meals.  - push oral fluids - lantus and ssi, appreciate dm recs - d/c tomorrow if all stable  # Emesis Resolved. KUB unremarkable. 2/2 DKA  # Migraine, chronic Reports longstanding history of migraine with almost daily headache, currently has migraine headache, nausea. 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   DVT prophylaxis: lovenox Code Status: full Family Communication: wife at bedside 10/15  Level of care: Med-Surg Status is:  Inpatient Remains inpatient appropriate because: severity of illness    Consultants:  none  Procedures: none  Antimicrobials:  none    Subjective: Feeling much better, mild nausea, tolerating diet, no vomiting  Objective: Vitals:   11/22/23 1833 11/22/23 1933 11/23/23 0351 11/23/23 0732  BP: (!) 144/84 (!) 151/83 132/88 (!) 142/86  Pulse: 97 82 95 80  Resp: 16 16 16 16   Temp: 99.2 F (37.3 C) 98.7 F (37.1 C) 98.9 F (37.2 C) 98.6 F (37 C)  TempSrc: Oral Oral Oral Oral  SpO2: 98% 98% 97% 98%  Weight:      Height:        Intake/Output Summary (Last 24 hours) at 11/23/2023 1357 Last data filed at 11/23/2023 0100 Gross per 24 hour  Intake 240 ml  Output 1400 ml  Net -1160 ml   Filed Weights   11/20/23 0029 11/20/23 1057  Weight: 94.8 kg 93.1 kg    Examination:  General exam: NAD Respiratory system: Clear to auscultation. Respiratory effort normal. Cardiovascular system: S1 & S2 heard, RRR  Gastrointestinal system: Abdomen is nondistended, soft and nontender. Decreased bowel sounds throughout Central nervous system: Alert and oriented. No focal neurological deficits. Extremities: Symmetric 5 x 5 power. Skin: No rashes, lesions or ulcers Psychiatry: Judgement and insight appear normal. Mood & affect appropriate.     Data Reviewed: I have personally reviewed following labs and imaging studies  CBC: Recent Labs  Lab 11/20/23 0103  WBC 19.3*  NEUTROABS 16.5*  HGB 17.0  HCT 54.7*  MCV  94.1  PLT 242   Basic Metabolic Panel: Recent Labs  Lab 11/20/23 0103 11/20/23 0433 11/21/23 0741 11/21/23 1406 11/22/23 0427 11/22/23 1430 11/23/23 0631  NA 139   < > 141 140 142 142 139  K 4.7   < > 3.5 3.6 3.3* 3.4* 3.3*  CL 106   < > 112* 113* 111 108 103  CO2 7*   < > 21* 18* 23 28 25   GLUCOSE 325*   < > 141* 145* 160* 161* 208*  BUN 31*   < > 10 9 6  <5* 7  CREATININE 1.40*   < > 0.64 0.56* 0.57* 0.47* 0.50*  CALCIUM  8.8*   < > 8.5* 8.6* 8.3* 8.2*  8.4*  MG 2.6*  --   --   --   --   --   --    < > = values in this interval not displayed.   GFR: Estimated Creatinine Clearance: 117.2 mL/min (A) (by C-G formula based on SCr of 0.5 mg/dL (L)). Liver Function Tests: Recent Labs  Lab 11/20/23 0103 11/22/23 1430  AST 19 13*  ALT 23 13  ALKPHOS 81 52  BILITOT 1.6* 1.1  PROT 8.5* 5.7*  ALBUMIN 5.1* 3.4*   Recent Labs  Lab 11/20/23 0103 11/22/23 1430  LIPASE 286* 25   No results for input(s): AMMONIA in the last 168 hours. Coagulation Profile: No results for input(s): INR, PROTIME in the last 168 hours. Cardiac Enzymes: No results for input(s): CKTOTAL, CKMB, CKMBINDEX, TROPONINI in the last 168 hours. BNP (last 3 results) No results for input(s): PROBNP in the last 8760 hours. HbA1C: No results for input(s): HGBA1C in the last 72 hours.  CBG: Recent Labs  Lab 11/22/23 1935 11/22/23 2323 11/23/23 0351 11/23/23 0731 11/23/23 1131  GLUCAP 170* 164* 188* 221* 195*   Lipid Profile: No results for input(s): CHOL, HDL, LDLCALC, TRIG, CHOLHDL, LDLDIRECT in the last 72 hours. Thyroid Function Tests: No results for input(s): TSH, T4TOTAL, FREET4, T3FREE, THYROIDAB in the last 72 hours. Anemia Panel: No results for input(s): VITAMINB12, FOLATE, FERRITIN, TIBC, IRON, RETICCTPCT in the last 72 hours. Urine analysis:    Component Value Date/Time   COLORURINE YELLOW (A) 11/20/2023 0404   APPEARANCEUR HAZY (A) 11/20/2023 0404   LABSPEC 1.022 11/20/2023 0404   PHURINE 5.0 11/20/2023 0404   GLUCOSEU >=500 (A) 11/20/2023 0404   HGBUR SMALL (A) 11/20/2023 0404   BILIRUBINUR NEGATIVE 11/20/2023 0404   BILIRUBINUR negative 05/10/2019 0819   KETONESUR 80 (A) 11/20/2023 0404   PROTEINUR 100 (A) 11/20/2023 0404   UROBILINOGEN 0.2 05/10/2019 0819   NITRITE NEGATIVE 11/20/2023 0404   LEUKOCYTESUR NEGATIVE 11/20/2023 0404   Sepsis  Labs: @LABRCNTIP (procalcitonin:4,lacticidven:4)  ) Recent Results (from the past 240 hours)  Blood culture (routine x 2)     Status: None (Preliminary result)   Collection Time: 11/20/23  4:33 AM   Specimen: BLOOD  Result Value Ref Range Status   Specimen Description   Final    BLOOD BLOOD RIGHT ARM Performed at [facility], [address], [place], [place] [address]    Special Requests   Final    BOTTLES DRAWN AEROBIC AND ANAEROBIC Blood Culture adequate volume Performed at [facility], [address], [place], [place] [address]    Culture   Final    NO GROWTH 3 DAYS Performed at [facility], [address], [place], [place] [address]    Report Status PENDING  Incomplete  Blood culture (  routine x 2)     Status: None (Preliminary result)   Collection Time: 11/20/23  4:33 AM   Specimen: BLOOD  Result Value Ref Range Status   Specimen Description BLOOD BLOOD LEFT ARM  Final   Special Requests   Final    BOTTLES DRAWN AEROBIC AND ANAEROBIC Blood Culture adequate volume   Culture   Final    NO GROWTH 3 DAYS Performed at [facility], [address] [place], [place] [address]    Report Status PENDING  Incomplete  MRSA Next Gen by PCR, Nasal     Status: None   Collection Time: 11/20/23 11:01 AM   Specimen: Nasal Mucosa; Nasal Swab  Result Value Ref Range Status   MRSA by PCR Next Gen NOT DETECTED NOT DETECTED Final    Comment: (NOTE) The GeneXpert MRSA Assay (FDA approved for NASAL specimens only), is one component of a comprehensive MRSA colonization surveillance program. It is not intended to diagnose MRSA infection nor to guide or monitor treatment for MRSA infections. Test performance is not FDA approved in patients less than 24 years old. Performed at [facility], [address], [place], [place] [address]          Radiology Studies: DG Abd 1 View Result Date: 11/22/2023 CLINICAL DATA:  Nausea and vomiting.  EXAM: ABDOMEN - 1 VIEW COMPARISON:  Abdominopelvic CT 11/20/2023. FINDINGS: 1413 hours. Two supine views of the abdomen are submitted. There is a nonobstructive bowel gas pattern. Mildly prominent stool within the right colon. No supine evidence of free intraperitoneal air. Scattered vascular calcifications are noted. There are mild degenerative changes within the lumbar spine and hips. IMPRESSION: No evidence of bowel obstruction or other acute process. Mildly prominent stool in the right colon. Electronically Signed   By: Elsie Perone M.D.   On: 11/22/2023 14:42         Scheduled Meds:  enoxaparin (LOVENOX) injection  40 mg Subcutaneous Q24H   insulin  aspart  0-15 Units Subcutaneous TID WC   insulin  aspart  0-5 Units Subcutaneous QHS   [START ON 11/24/2023] insulin  glargine  18 Units Subcutaneous Daily   metoCLOPramide  (REGLAN ) injection  10 mg Intravenous Q8H   Continuous Infusions:     LOS: 3 days     Devaughn KATHEE Ban, MD [facility]   If 7PM-7AM, please contact night-coverage [email or web address] Password [email or web address] 11/23/2023, 1:57 PM

## 2023-11-23 NOTE — TOC CM/SW Note (Signed)
 Transition of Care [facility]) - Inpatient Brief Assessment   Patient Details  Name: Bruce Brandt MRN: [id number] Date of Birth: 03/23/1970  [facility] CM/SW Contact:    Daved JONETTA Hamilton, RN Phone Number: 11/23/2023, 10:40 AM   Clinical Narrative:   Transition of Care (TOC) Screening Note   Patient Details  Name: Bruce Brandt Date of Birth: 21-Nov-1970   Transition of Care [facility]) CM/SW Contact:    Daved JONETTA Hamilton, RN Phone Number: 11/23/2023, 10:40 AM    Transition of Care Department [facility]) has reviewed patient and no TOC needs have been identified at this time. If new patient transition needs arise, please place a TOC consult.    Transition of Care Asessment: Insurance and Status: Insurance coverage has been reviewed Patient has primary care physician: Yes     Prior/Current Home Services: No current home services Social Drivers of Health Review: SDOH reviewed no interventions necessary Readmission risk has been reviewed: Yes Transition of care needs: no transition of care needs at this time

## 2023-11-23 NOTE — Progress Notes (Signed)
 Patient stated this morning that he felt the best this evening when he had his Zofran  and Reglan  within 10 minutes of each other. Patient requested ice chips, drinking fluids make him feel worse.

## 2023-11-24 ENCOUNTER — Other Ambulatory Visit: Payer: Self-pay

## 2023-11-24 DIAGNOSIS — E1110 Type 2 diabetes mellitus with ketoacidosis without coma: Secondary | ICD-10-CM | POA: Diagnosis not present

## 2023-11-24 LAB — BASIC METABOLIC PANEL WITH GFR
Anion gap: 13 (ref 5–15)
BUN: 9 mg/dL (ref 6–20)
CO2: 25 mmol/L (ref 22–32)
Calcium: 8.5 mg/dL — ABNORMAL LOW (ref 8.9–10.3)
Chloride: 100 mmol/L (ref 98–111)
Creatinine, Ser: 0.46 mg/dL — ABNORMAL LOW (ref 0.61–1.24)
GFR, Estimated: >60 mL/min (ref >60)
Glucose, Bld: 150 mg/dL — ABNORMAL HIGH (ref 70–99)
Potassium: 3.3 mmol/L — ABNORMAL LOW (ref 3.5–5.1)
Sodium: 138 mmol/L (ref 135–145)

## 2023-11-24 LAB — GLUCOSE, CAPILLARY
Glucose-Capillary: 148 mg/dL — ABNORMAL HIGH (ref 70–99)
Glucose-Capillary: 158 mg/dL — ABNORMAL HIGH (ref 70–99)
Glucose-Capillary: 189 mg/dL — ABNORMAL HIGH (ref 70–99)

## 2023-11-24 MED ORDER — INSULIN LISPRO PROT & LISPRO (75-25 MIX) 100 UNIT/ML KWIKPEN
10.0000 [IU] | PEN_INJECTOR | Freq: Two times a day (BID) | SUBCUTANEOUS | 0 refills | Status: AC
  Filled 2023-11-24: qty 9, 31d supply, fill #0

## 2023-11-24 MED ORDER — POTASSIUM CHLORIDE CRYS ER 20 MEQ PO TBCR
40.0000 meq | EXTENDED_RELEASE_TABLET | Freq: Two times a day (BID) | ORAL | Status: DC
  Administered 2023-11-24: 40 meq via ORAL
  Filled 2023-11-24: qty 2

## 2023-11-24 MED ORDER — INSUPEN PEN NEEDLES 32G X 4 MM MISC
0 refills | Status: AC
  Filled 2023-11-24: qty 100, 30d supply, fill #0

## 2023-11-24 MED ORDER — TRESIBA FLEXTOUCH 100 UNIT/ML ~~LOC~~ SOPN
18.0000 [IU] | PEN_INJECTOR | Freq: Every day | SUBCUTANEOUS | 0 refills | Status: DC
  Filled 2023-11-24: qty 5.4, 30d supply, fill #0

## 2023-11-24 NOTE — Inpatient Diabetes Management (Signed)
 Inpatient Diabetes Program Recommendations  AACE/ADA: New Consensus Statement on Inpatient Glycemic Control (2015)  Target Ranges:  Prepandial:   less than 140 mg/dL      Peak postprandial:   less than 180 mg/dL (1-2 hours)      Critically ill patients:  140 - 180 mg/dL    Latest Reference Range & Units 11/23/23 07:31 11/23/23 11:31 11/23/23 15:26 11/23/23 20:28  Glucose-Capillary 70 - 99 mg/dL 778 (H)  3 units Novolog   195 (H)  2 units Novolog   14 units Lantus  226 (H)  5 units Novolog   197 (H)  (H): Data is abnormally high  Latest Reference Range & Units 11/24/23 07:47  Glucose-Capillary 70 - 99 mg/dL 851 (H)  2 units Novolog   18 units Lantus  (H): Data is abnormally high   Admit with: DKA after stopping Insulin  at home (stopped insulin  4 days PTA per PCP orders)   History: DM (Labeled as LADA in ENDO notes from Aug 2025)   Home DM Meds: Freestyle Libre 3 CGM                             Humalog 75/25 Insulin  10-14 units BID                             Jardiance  25 mg daily                             Mounjaro 2.5 mg Qweek                                 Current Orders: Lantus 18 units daily                           Novolog  Moderate Correction Scale/ SSI (0-15 units) TID AC + HS    Lantus increased to 18 units daily this AM Novolog  SSI also increased to the 0-15 unit scale  At discharge, he will likely need to resume Humalog 75/25 as patient prefers only 2 shots per day     Discharge Recommendations:  Intermediate acting recommendations: Insulin  Lispro Prot & Lispro (HUMALOG 75/25) Kwikpen 12 units BID with Breakfast and Dinner  Supply/Referral recommendations: Pen needles - standard Freestyle Libre 3 Glucose Sensors: Order Number [id number]--please give 4 refills  --Will follow patient during hospitalization--  Adina Rudolpho Arrow RN, MSN, CDCES Diabetes Coordinator Inpatient Glycemic Control Team Team Pager: [phone] (8a-5p)

## 2023-11-24 NOTE — Plan of Care (Signed)

## 2023-11-24 NOTE — Discharge Summary (Signed)
 Physician Discharge Summary  Patient: Bruce Brandt FMW:[id number] DOB: Jun 30, 1970   Code Status: Full Code Admit date: 11/20/2023 Discharge date: 11/24/2023 Disposition: Home, No home health services recommended PCP: Bruce Angeline ORN, Bruce Brandt  Recommendations for Outpatient Follow-up:  Follow up with PCP within 1-2 weeks Regarding general hospital follow up and preventative care Recommend BMP- mild hypokalemia on admission  Review insulin  use and diabetes management   Discharge Diagnoses:  Principal Problem:   Diabetic ketoacidosis without coma associated with type 2 diabetes mellitus (HCC) Active Problems:   Type 2 diabetes mellitus with hyperglycemia, without long-term current use of insulin  (HCC)   Leukocytosis   Low back pain  Brief Hospital Course Summary: Bruce Brandt is a 53 y.o. male with medical history significant for Insulin -dependent type 2 diabetes being admitted with DKA in the setting of discontinuing his insulin .   # T2DM, uncontrolled  # DKA A1c is 12.  Started on IVF and insulin  drip initially until acidosis resolved and tolerating PO diet Transitioned to subcutaneous insulin  and did well.  Diabetes coordinator was consulted and provided education and recommendations for discharge.    # Adrenal incidentaloma - outpt eval  All other chronic conditions were treated with home medications.    Discharge Condition: Good, improved Recommended discharge diet: Diabetic diet  Consultations: None   Procedures/Studies: None   Allergies as of 11/24/2023       Reactions   Metformin  Hcl Other (See Comments)   GI upset   Covid-19 (mrna) Vaccine (pfizer) [covid-19 (mrna) Vaccine]    Bruising under great toe on both feet 1 week s/p Moderna Immunization        Medication List     STOP taking these medications    Tresiba FlexTouch 100 UNIT/ML FlexTouch Pen Generic drug: insulin  degludec       TAKE these medications    baclofen 10 MG tablet Commonly  known as: LIORESAL Take 10 mg by mouth daily as needed (Headache).   BD Pen Needle Nano 2nd Gen 32G X 4 MM Misc Generic drug: Insulin  Pen Needle BD PEN NEEDLES- BRAND SPECIFIC. INJECT INSULIN  VIA INSULIN  PEN 6 X DAILY   Co-Enzyme Q-10 30 MG Caps Take 30 mg by mouth daily.   Emgality 120 MG/ML Soaj Generic drug: Galcanezumab-gnlm Inject 120 mg into the skin every 30 (thirty) days.   FreeStyle Libre 3 Sensor Misc 1 Device by Does not apply route every 14 (fourteen) days. Place 1 sensor on the skin every 14 days. Use to check glucose continuously   HumaLOG Mix 75/25 KwikPen (75-25) 100 UNIT/ML KwikPen Generic drug: Insulin  Lispro Prot & Lispro Inject 10-14 Units into the skin 2 (two) times daily before a meal.   hydrocortisone  2.5 % lotion Apply topically 2 (two) times daily.   imipramine  50 MG tablet Commonly known as: TOFRANIL  Take 100 mg by mouth daily.   Jardiance  25 MG Tabs tablet Generic drug: empagliflozin  TAKE 1 TABLET (25 MG TOTAL) BY MOUTH DAILY.   Magnesium 100 MG Tabs Take 200 mg by mouth daily.   multivitamin-iron-minerals-folic acid chewable tablet Chew 1 tablet by mouth daily.   Naproxen Sod-diphenhydrAMINE  220-25 MG Tabs Take 2 tablets by mouth at bedtime as needed.   OneTouch Ultra test strip Generic drug: glucose blood Check blood sugar up to three times a day   Turmeric 400 MG Caps Take 400 mg by mouth daily.   Vitamin D-3 125 MCG (5000 UT) Tabs Take 2 capsules by mouth daily.   zonisamide 100  MG capsule Commonly known as: ZONEGRAN Take 300 mg by mouth daily.        Follow-up Information     Bruce Angeline ORN, Bruce Brandt. Schedule an appointment as soon as possible for a visit in 1 week(s).   Specialties: Internal Medicine, Emergency Medicine Contact information: [address] [place] [address] [phone]                 Subjective   Pt reports feeling well and a little tired. Denies nausea or vomiting. He tolerated breakfast well.  Expresses agreement to continue insulin .   All questions and concerns were addressed at time of discharge.  Objective  Blood pressure 129/79, pulse 75, temperature 98.6 F (37 C), resp. rate 18, height 6' (1.829 m), weight 93.1 kg, SpO2 97%.   General: Pt is alert, awake, not in acute distress Cardiovascular: RRR, S1/S2 +, no rubs, no gallops Respiratory: CTA bilaterally, no wheezing, no rhonchi Abdominal: Soft, NT, ND, bowel sounds + Extremities: no edema, no cyanosis  The results of significant diagnostics from this hospitalization (including imaging, microbiology, ancillary and laboratory) are listed below for reference.   Imaging studies: DG Abd 1 View Result Date: 11/22/2023 CLINICAL DATA:  Nausea and vomiting. EXAM: ABDOMEN - 1 VIEW COMPARISON:  Abdominopelvic CT 11/20/2023. FINDINGS: 1413 hours. Two supine views of the abdomen are submitted. There is a nonobstructive bowel gas pattern. Mildly prominent stool within the right colon. No supine evidence of free intraperitoneal air. Scattered vascular calcifications are noted. There are mild degenerative changes within the lumbar spine and hips. IMPRESSION: No evidence of bowel obstruction or other acute process. Mildly prominent stool in the right colon. Electronically Signed   By: Elsie Perone M.D.   On: 11/22/2023 14:42   CT L-SPINE NO CHARGE Result Date: 11/20/2023 CLINICAL DATA:  Back pain EXAM: CT Lumbar Spine with contrast TECHNIQUE: Technique: Multiplanar CT images of the lumbar spine were reconstructed from contemporary CT of the Abdomen and Pelvis. RADIATION DOSE REDUCTION: This exam was performed according to the departmental dose-optimization program which includes automated exposure control, adjustment of the mA and/or kV according to patient size and/or use of iterative reconstruction technique. CONTRAST:  No additional COMPARISON:  None Available. FINDINGS: Segmentation: 5 lumbar type vertebral bodies are well visualized.  Alignment: Alignment is within normal limits. Vertebrae: Vertebral body height is well maintained. No anterolisthesis is seen. Osteophytic changes are noted posteriorly at T12-L1 and L1-L2. Anterior osteophytes are noted as well. Facet hypertrophic changes are seen. No acute fracture or acute facet abnormality is noted. Visualize ribcage is within normal limits. Paraspinal and other soft tissues: Paraspinal tissues are within normal limits. Disc levels: No significant disc pathology is noted. IMPRESSION: Degenerative change without acute abnormality. Electronically Signed   By: Oneil Devonshire M.D.   On: 11/20/2023 03:46   CT CHEST ABDOMEN PELVIS W CONTRAST Result Date: 11/20/2023 CLINICAL DATA:  DKA with nausea and vomiting EXAM: CT CHEST, ABDOMEN, AND PELVIS WITH CONTRAST TECHNIQUE: Multidetector CT imaging of the chest, abdomen and pelvis was performed following the standard protocol during bolus administration of intravenous contrast. RADIATION DOSE REDUCTION: This exam was performed according to the departmental dose-optimization program which includes automated exposure control, adjustment of the mA and/or kV according to patient size and/or use of iterative reconstruction technique. CONTRAST:  OMNIPAQUE  IOHEXOL  300 MG/ML  SOLN COMPARISON:  02/24/2022 FINDINGS: CT CHEST FINDINGS Cardiovascular: Aorta and its branches are within normal limits. No cardiac enlargement is seen. Pulmonary artery is  within normal limits. Mediastinum/Nodes: Thoracic inlet is unremarkable. No hilar or mediastinal adenopathy is noted. The esophagus as visualized is within normal limits. Lungs/Pleura: Lungs are well aerated bilaterally. No focal infiltrate or effusion is noted. Tiny subpleural nodule is noted in the right lower lobe measuring 3 mm. No other nodules are noted. Musculoskeletal: No chest wall mass or suspicious bone lesions identified. CT ABDOMEN PELVIS FINDINGS Hepatobiliary: No focal liver abnormality is seen. No  gallstones, gallbladder wall thickening, or biliary dilatation. Pancreas: Unremarkable. No pancreatic ductal dilatation or surrounding inflammatory changes. Spleen: Normal in size without focal abnormality. Adrenals/Urinary Tract: Left adrenal gland again demonstrates small nodules. These are most consistent with adenomas, stable from the prior exam. Right adrenal gland is within normal limits. No renal calculi or obstructive changes are noted. Normal enhancement is seen. The bladder is well distended. Stomach/Bowel: The appendix is not well visualized. No inflammatory changes to suggest appendicitis are noted. No obstructive or inflammatory change of the colon is seen. Stomach is distended fluid. Small bowel is unremarkable. Vascular/Lymphatic: No significant vascular findings are present. No enlarged abdominal or pelvic lymph nodes. Reproductive: Prostate is unremarkable. Other: No abdominal wall hernia or abnormality. No abdominopelvic ascites. Musculoskeletal: Mild degenerative changes of lumbar spine are noted. IMPRESSION: CT of the chest:Right solid pulmonary nodule measuring 3 mm. Per Fleischner Society Guidelines, no routine follow-up imaging is recommended. These guidelines do not apply to immunocompromised patients and patients with cancer. Follow up in patients with significant comorbidities as clinically warranted. For lung cancer screening, adhere to Lung-RADS guidelines. Reference: Radiology. 2017; 284(1):228-43. No other focal abnormality is noted. CT of the abdomen and pelvis: Stable left adrenal nodules consistent with small adenomas. No acute abnormality noted. Electronically Signed   By: Oneil Devonshire M.D.   On: 11/20/2023 03:44   DG Chest Portable 1 View Result Date: 11/20/2023 CLINICAL DATA:  Nausea and vomiting EXAM: PORTABLE CHEST 1 VIEW COMPARISON:  None Available. FINDINGS: The heart size and mediastinal contours are within normal limits. Both lungs are clear. The visualized skeletal  structures are unremarkable. IMPRESSION: No active disease. Electronically Signed   By: Oneil Devonshire M.D.   On: 11/20/2023 02:07    Labs: Basic Metabolic Panel: Recent Labs  Lab 11/20/23 0103 11/20/23 0433 11/21/23 1406 11/22/23 0427 11/22/23 1430 11/23/23 0631 11/24/23 0402  NA 139   < > 140 142 142 139 138  K 4.7   < > 3.6 3.3* 3.4* 3.3* 3.3*  CL 106   < > 113* 111 108 103 100  CO2 7*   < > 18* 23 28 25 25   GLUCOSE 325*   < > 145* 160* 161* 208* 150*  BUN 31*   < > 9 6 <5* 7 9  CREATININE 1.40*   < > 0.56* 0.57* 0.47* 0.50* 0.46*  CALCIUM  8.8*   < > 8.6* 8.3* 8.2* 8.4* 8.5*  MG 2.6*  --   --   --   --   --   --    < > = values in this interval not displayed.   CBC: Recent Labs  Lab 11/20/23 0103  WBC 19.3*  NEUTROABS 16.5*  HGB 17.0  HCT 54.7*  MCV 94.1  PLT 242   Microbiology: Results for orders placed or performed during the hospital encounter of 11/20/23  Blood culture (routine x 2)     Status: None (Preliminary result)   Collection Time: 11/20/23  4:33 AM   Specimen: BLOOD  Result Value Ref Range Status  Specimen Description BLOOD BLOOD RIGHT ARM  Final   Special Requests   Final    BOTTLES DRAWN AEROBIC AND ANAEROBIC Blood Culture adequate volume   Culture   Final    NO GROWTH 4 DAYS Performed at [facility], [address] [place], [place] [address]    Report Status PENDING  Incomplete  Blood culture (routine x 2)     Status: None (Preliminary result)   Collection Time: 11/20/23  4:33 AM   Specimen: BLOOD  Result Value Ref Range Status   Specimen Description BLOOD BLOOD LEFT ARM  Final   Special Requests   Final    BOTTLES DRAWN AEROBIC AND ANAEROBIC Blood Culture adequate volume   Culture   Final    NO GROWTH 4 DAYS Performed at [facility], [address] [place], [place] [address]    Report Status PENDING  Incomplete  MRSA Next Gen by PCR, Nasal     Status: None   Collection Time: 11/20/23 11:01 AM   Specimen: Nasal  Mucosa; Nasal Swab  Result Value Ref Range Status   MRSA by PCR Next Gen NOT DETECTED NOT DETECTED Final    Comment: (NOTE) The GeneXpert MRSA Assay (FDA approved for NASAL specimens only), is one component of a comprehensive MRSA colonization surveillance program. It is not intended to diagnose MRSA infection nor to guide or monitor treatment for MRSA infections. Test performance is not FDA approved in patients less than 27 years old. Performed at [facility], [address] [place], [place] [address]     Time coordinating discharge: Over 30 minutes  Marien LITTIE Piety, MD  [facility] 11/24/2023, 12:34 PM

## 2023-11-25 ENCOUNTER — Telehealth: Payer: Self-pay

## 2023-11-25 LAB — CULTURE, BLOOD (ROUTINE X 2)
Culture: NO GROWTH
Culture: NO GROWTH
Special Requests: ADEQUATE
Special Requests: ADEQUATE

## 2023-11-25 NOTE — Transitions of Care (Post Inpatient/ED Visit) (Signed)
   11/25/2023  Name: Bruce Brandt MRN: [id number] DOB: 02-Jul-1970  Today's TOC FU Call Status: Today's TOC FU Call Status:: Unsuccessful Call (1st Attempt) Unsuccessful Call (1st Attempt) Date: 11/25/23  Attempted to reach the patient regarding the most recent Inpatient/ED visit.  Follow Up Plan: Additional outreach attempts will be made to reach the patient to complete the Transitions of Care (Post Inpatient/ED visit) call.   Medford Balboa, BSN, RN [place]  [facility] - [facility] RN Care Manager [phone]

## 2023-11-28 ENCOUNTER — Telehealth: Payer: Self-pay

## 2023-11-28 NOTE — Transitions of Care (Post Inpatient/ED Visit) (Unsigned)
   11/28/2023  Name: Bruce Brandt MRN: [id number] DOB: 11-10-1970  Today's TOC FU Call Status: Today's TOC FU Call Status:: Unsuccessful Call (2nd Attempt) Unsuccessful Call (1st Attempt) Date: 11/25/23 Unsuccessful Call (2nd Attempt) Date: 11/28/23  Attempted to reach the patient regarding the most recent Inpatient/ED visit.  Follow Up Plan: Additional outreach attempts will be made to reach the patient to complete the Transitions of Care (Post Inpatient/ED visit) call.   Medford Balboa, BSN, RN [place]  [facility]BC[facility] - [facility] RN Care Manager [phone]

## 2023-11-28 NOTE — Transitions of Care (Post Inpatient/ED Visit) (Signed)
   11/28/2023  Name: Bruce Brandt MRN: [id number] DOB: [birth year]  Today's TOC FU Call Status:    Attempted to reach the patient regarding the most recent Inpatient/ED visit.  Follow Up Plan: Additional outreach attempts will be made to reach the patient to complete the Transitions of Care (Post Inpatient/ED visit) call.   Medford Balboa, BSN, RN [place]  [facility] - [facility] RN Care Manager [phone]

## 2023-11-29 ENCOUNTER — Telehealth: Payer: Self-pay

## 2023-11-29 NOTE — Transitions of Care (Post Inpatient/ED Visit) (Signed)
   11/29/2023  Name: Bruce Brandt MRN: [id number] DOB: 03/04/1970  Today's TOC FU Call Status: Today's TOC FU Call Status:: Unsuccessful Call (3rd Attempt) Unsuccessful Call (1st Attempt) Date: 11/25/23 Unsuccessful Call (2nd Attempt) Date: 11/28/23 Unsuccessful Call (3rd Attempt) Date: 11/29/23  Attempted to reach the patient regarding the most recent Inpatient/ED visit.  Follow Up Plan: No further outreach attempts will be made at this time. We have been unable to contact the patient.  Medford Balboa, BSN, RN [place]  VBCI - [facility] RN Care Manager [phone]

## 2023-12-01 ENCOUNTER — Encounter: Payer: Self-pay | Admitting: Internal Medicine

## 2023-12-01 ENCOUNTER — Ambulatory Visit (INDEPENDENT_AMBULATORY_CARE_PROVIDER_SITE_OTHER): Admitting: Internal Medicine

## 2023-12-01 VITALS — BP 122/78 | Ht 72.0 in | Wt 215.8 lb

## 2023-12-01 DIAGNOSIS — E876 Hypokalemia: Secondary | ICD-10-CM

## 2023-12-01 DIAGNOSIS — Z23 Encounter for immunization: Secondary | ICD-10-CM | POA: Diagnosis not present

## 2023-12-01 DIAGNOSIS — E1110 Type 2 diabetes mellitus with ketoacidosis without coma: Secondary | ICD-10-CM

## 2023-12-01 DIAGNOSIS — Z794 Long term (current) use of insulin: Secondary | ICD-10-CM | POA: Diagnosis not present

## 2023-12-01 LAB — BASIC METABOLIC PANEL WITH GFR
BUN/Creatinine Ratio: 30 (calc) — ABNORMAL HIGH (ref 6–22)
BUN: 18 mg/dL (ref 7–25)
CO2: 24 mmol/L (ref 20–32)
Calcium: 9.2 mg/dL (ref 8.6–10.3)
Chloride: 107 mmol/L (ref 98–110)
Creat: 0.6 mg/dL — ABNORMAL LOW (ref 0.70–1.30)
Glucose, Bld: 184 mg/dL — ABNORMAL HIGH (ref 65–99)
Potassium: 5 mmol/L (ref 3.5–5.3)
Sodium: 139 mmol/L (ref 135–146)
eGFR: 115 mL/min/1.73m2 (ref >=60)

## 2023-12-01 MED ORDER — TRESIBA FLEXTOUCH 100 UNIT/ML ~~LOC~~ SOPN: 10.0000 [IU] | PEN_INJECTOR | Freq: Every day | SUBCUTANEOUS | 0 refills | Status: AC

## 2023-12-01 NOTE — Progress Notes (Signed)
 Subjective:    Patient ID: Bruce Brandt, male    DOB: 16-Nov-1970, 53 y.o.   MRN: [id number]  HPI   Discussed the use of AI scribe software for clinical note transcription with the patient, who gave verbal consent to proceed.  Bruce Brandt is a 53 year old male with diabetes who presents with fatigue following a recent hospitalization for diabetic ketoacidosis.  He was hospitalized from October 12th to October 16th due to complications from a new diabetes management program initiated with [facility], which involved discontinuing his insulin  and cholesterol medications and starting a strict low-carb diet. Shortly after starting the program, he experienced severe back pain, nausea, vomiting, cold sweats, and tachycardia, leading to an emergency room visit and subsequent hospitalization. He was treated with IV fluids and an insulin  drip during his hospitalization.  During his hospital stay, diagnostic tests including a chest x-ray, CT of the chest, abdomen, and pelvis, and a CT of the lumbar spine were performed, revealing arthritic changes. An abdominal x-ray indicated constipation. He was discharged on an insulin  regimen including Humalog 75/25, and discontinued his [place] (although he has still been taking this), with adjustments based on meal size, and continues to take Jardiance .  Since discharge, he feels persistently fatigued and 'run down,' with conversation causing exhaustion. No nausea, vomiting, or significant back pain, but he notes decreased vision clarity, requiring stronger reading glasses. He monitors his blood sugar, which has mostly remained under 175 mg/dL. He is managing his diet by controlling carbohydrate intake and drinking water.  He has been out of work for the past two weeks due to his health issues and plans to return once his energy levels improve. He is self-employed. He is scheduled to travel to [place] on November 17th and is concerned about managing his  diabetes during the trip.       Review of Systems     Past Medical History:  Diagnosis Date   Carpal tunnel syndrome, bilateral 05/18/2016   Diabetes (HCC)    Frequent headaches    Gingivitis     Current Outpatient Medications  Medication Sig Dispense Refill   baclofen (LIORESAL) 10 MG tablet Take 10 mg by mouth daily as needed (Headache).     Cholecalciferol (VITAMIN D-3) 125 MCG (5000 UT) TABS Take 2 capsules by mouth daily.     Co-Enzyme Q-10 30 MG CAPS Take 30 mg by mouth daily.     Continuous Blood Gluc Sensor (FREESTYLE LIBRE 3 SENSOR) MISC 1 Device by Does not apply route every 14 (fourteen) days. Place 1 sensor on the skin every 14 days. Use to check glucose continuously 6 each 1   EMGALITY 120 MG/ML SOAJ Inject 120 mg into the skin every 30 (thirty) days.     glucose blood (ONETOUCH ULTRA) test strip Check blood sugar up to three times a day 100 each 12   Insulin  Lispro Prot & Lispro (HUMALOG MIX 75/25 KWIKPEN) (75-25) 100 UNIT/ML Kwikpen Inject 10-14 Units into the skin 2 (two) times daily before a meal. 15 mL 0   hydrocortisone  2.5 % lotion Apply topically 2 (two) times daily. 59 mL 0   imipramine  (TOFRANIL ) 50 MG tablet Take 100 mg by mouth daily.     Insulin  Pen Needle (BD PEN NEEDLE NANO 2ND GEN) 32G X 4 MM MISC BD PEN NEEDLES- BRAND SPECIFIC. INJECT INSULIN  VIA INSULIN  PEN 6 X DAILY 300 each 3   Insulin  Pen Needle (INSUPEN PEN NEEDLES) 32G X 4 MM  MISC use with insulin  100 each 0   JARDIANCE  25 MG TABS tablet TAKE 1 TABLET (25 MG TOTAL) BY MOUTH DAILY. 30 tablet 2   Magnesium 100 MG TABS Take 200 mg by mouth daily.     multivitamin-iron-minerals-folic acid (CENTRUM) chewable tablet Chew 1 tablet by mouth daily.     Naproxen Sod-diphenhydrAMINE  220-25 MG TABS Take 2 tablets by mouth at bedtime as needed.     Turmeric 400 MG CAPS Take 400 mg by mouth daily.     zonisamide (ZONEGRAN) 100 MG capsule Take 300 mg by mouth daily.     No current facility-administered  medications for this visit.    Allergies  Allergen Reactions   Metformin  Hcl Other (See Comments)    GI upset   Covid-19 (Mrna) Vaccine Proofreader) [Covid-19 (Mrna) Vaccine]     Bruising under great toe on both feet 1 week s/p Moderna Immunization    Family History  Problem Relation Age of Onset   Heart disease Father    Diabetes Maternal Grandmother    Diabetes Paternal Grandmother     Social History   Socioeconomic History   Marital status: Married    Spouse name: Not on file   Number of children: 2   Years of education: 12   Highest education level: High school graduate  Occupational History   Occupation: Music therapist    Comment: Self-employed  Tobacco Use   Smoking status: Never   Smokeless tobacco: Never  Vaping Use   Vaping status: Never Used  Substance and Sexual Activity   Alcohol use: Yes    Comment: > 2 years ago had rare alcohol intake   Drug use: No   Sexual activity: Yes    Partners: Female    Comment: Married  Other Topics Concern   Not on file  Social History Narrative   Lives at home w/ his wife   Right-handed   Caffeine: three 16 oz drinks daily   Social Drivers of Corporate investment banker Strain: Low Risk  (01/03/2018)   Overall Financial Resource Strain (CARDIA)    Difficulty of Paying Living Expenses: Not hard at all  Food Insecurity: No Food Insecurity (11/20/2023)   Hunger Vital Sign    Worried About Running Out of Food in the Last Year: Never true    Ran Out of Food in the Last Year: Never true  Transportation Needs: No Transportation Needs (11/20/2023)   PRAPARE - Administrator, Civil Service (Medical): No    Lack of Transportation (Non-Medical): No  Physical Activity: Sufficiently Active (01/03/2018)   Exercise Vital Sign    Days of Exercise per Week: 5 days    Minutes of Exercise per Session: 50 min  Stress: No Stress Concern Present (01/03/2018)   [employer] of Occupational Health - Occupational Stress  Questionnaire    Feeling of Stress : Only a little  Social Connections: Socially Integrated (01/03/2018)   Social Connection and Isolation Panel    Frequency of Communication with Friends and Family: Twice a week    Frequency of Social Gatherings with Friends and Family: Twice a week    Attends Religious Services: More than 4 times per year    Active Member of [employer] or Organizations: Yes    Attends Banker Meetings: More than 4 times per year    Marital Status: Married  Catering manager Violence: Not At Risk (11/20/2023)   Humiliation, Afraid, Rape, and Kick questionnaire    Fear of  Current or Ex-Partner: No    Emotionally Abused: No    Physically Abused: No    Sexually Abused: No     Constitutional: Pt reports fatigue. Denies fever, malaise, headache or abrupt weight changes.  HEENT: Denies eye pain, eye redness, ear pain, ringing in the ears, wax buildup, runny nose, nasal congestion, bloody nose, or sore throat. Respiratory: Denies difficulty breathing, shortness of breath, cough or sputum production.   Cardiovascular: Denies chest pain, chest tightness, palpitations or swelling in the hands or feet.  Gastrointestinal:  Denies abdominal pain, bloating, constipation, diarrhea or blood in the stool.  GU: Pt reports erectile dysfunction. Denies urgency, frequency, pain with urination, burning sensation, blood in urine, odor or discharge. Musculoskeletal: Pt reports chronic right shoulder pain. Denies decrease in range of motion, difficulty with gait, muscle pain or joint pain and swelling.  Skin: Denies redness, rashes, lesions or ulcercations.  Neurological: Pt reports paresthesia of BUE. Denies dizziness, difficulty with memory, difficulty with speech or problems with balance and coordination.  Psych: Denies anxiety, depression, SI/HI.  No other specific complaints in a complete review of systems (except as listed in HPI above).  Objective:   Physical Exam  BP 122/78  (BP Location: Right Arm, Patient Position: Sitting, Cuff Size: Normal)   Ht 6' (1.829 m)   Wt 215 lb 12.8 oz (97.9 kg)   BMI 29.27 kg/m     Wt Readings from Last 3 Encounters:  11/20/23 205 lb 4 oz (93.1 kg)  06/10/23 212 lb 12.8 oz (96.5 kg)  12/08/22 210 lb (95.3 kg)    General: Appears his stated age, overweight in NAD. Skin: Warm, dry and intact. No ulcerations noted but he does have hyperpigmentation noted of BLE. HEENT: Head: normal shape and size; Eyes: sclera white, no icterus, conjunctiva pink, PERRLA and EOMs intact; Cardiovascular: Tachycardic with normal rhythm. S1,S2 noted.  No murmur, rubs or gallops noted. No JVD or BLE edema. No carotid bruits noted. Pulmonary/Chest: Normal effort and positive vesicular breath sounds. No respiratory distress. No wheezes, rales or ronchi noted.  Abdomen: Normal bowel sounds. Musculoskeletal: No difficulty with gait.  Neurological: Alert and oriented. Coordination normal.     BMET    Component Value Date/Time   NA 138 11/24/2023 0402   K 3.3 (L) 11/24/2023 0402   CL 100 11/24/2023 0402   CO2 25 11/24/2023 0402   GLUCOSE 150 (H) 11/24/2023 0402   BUN 9 11/24/2023 0402   CREATININE 0.46 (L) 11/24/2023 0402   CREATININE 0.78 01/22/2022 0821   CALCIUM  8.5 (L) 11/24/2023 0402   GFRNONAA >60 11/24/2023 0402   GFRNONAA 119 05/10/2019 0839   GFRAA 138 05/10/2019 0839    Lipid Panel     Component Value Date/Time   CHOL 165 09/14/2022 0000   TRIG 148 03/24/2023 0000   HDL 53 01/22/2022 0821   CHOLHDL 4.0 01/22/2022 0821   LDLCALC 179 03/24/2023 0000   LDLCALC 130 (H) 01/22/2022 0821    CBC    Component Value Date/Time   WBC 19.3 (H) 11/20/2023 0103   RBC 5.81 11/20/2023 0103   HGB 17.0 11/20/2023 0103   HCT 54.7 (H) 11/20/2023 0103   PLT 242 11/20/2023 0103   MCV 94.1 11/20/2023 0103   MCH 29.3 11/20/2023 0103   MCHC 31.1 11/20/2023 0103   RDW 12.8 11/20/2023 0103   LYMPHSABS 1.2 11/20/2023 0103   MONOABS 1.3 (H)  11/20/2023 0103   EOSABS 0.0 11/20/2023 0103   BASOSABS 0.1 11/20/2023 0103  Hgb A1C Lab Results  Component Value Date   HGBA1C 12.1 (H) 11/20/2023           Assessment & Plan:   Assessment and Plan    TCM hospital follow-up for type 2 diabetes mellitus with recent diabetic ketoacidosis Recent DKA after stopping insulin  and medications in a diabetes reversal program. Current insulin  regimen includes Humalog 75/25 and Tresiba. Blood glucose mostly controlled with occasional spikes. A1c was 12. Fatigue and numbness likely due to recent DKA and dehydration. Discussed risks of not maintaining insulin  therapy and importance of monitoring glucose levels. Decided to continue [place] until endocrinology follow-up. - Continue Humalog 75/25 insulin  regimen. - Continue Tresiba insulin  until endocrinology follow-up, monitor for hypoglycemia - Refer to endocrinologist for further diabetes management for second opinion. - Order CT coronary calcium  scan to assess cardiac risk. - Monitor blood glucose levels regularly. - Ensure adequate hydration and dietary management.    RTC in 1 months for your annual exam Angeline Laura, NP

## 2023-12-01 NOTE — Patient Instructions (Signed)
Diabetic Ketoacidosis  Diabetic ketoacidosis (DKA) is a serious health problem. It can happen when you have diabetes and don't have enough of a hormone called insulin in your body. Insulin helps control the amount of glucose, or sugar, in your blood.  DKA can happen when you have too much sugar in your blood. This can also cause you to build up ketones. Ketones can increase the acid level of your blood.  DKA must be treated in a hospital. If not treated, it can lead to a coma or even death.  What are the causes?  DKA is caused by a lack of insulin in your blood. It may happen if:  You have type 1 diabetes.  There's stress on your body. This stress can be brought on by an illness.  You have an infection.  You take certain medicines.  You don't take all your diabetes medicines.  You don't take your insulin.  The insulin doesn't come through the insulin pump like it should. This can happen if there's a problem in the tubing that connects you to the pump.  What are the signs or symptoms?  Early symptoms of DKA may include:  Feeling very thirsty.  Dry mouth.  Peeing a lot.  More severe symptoms may include:  Pain in your abdomen.  Throwing up or feeling like you may throw up.  Changes to your eyesight.  Breath that smells fruity or sweet.  Irritability.  Confusion.  Fast breathing.  How is this diagnosed?  DKA is diagnosed based on your medical history, an exam, and tests. These tests may include:  Blood tests.  A pee (urine) test to check for ketones.  How is this treated?  DKA is treated in the hospital. You may be treated with:  Fluids. You may be given fluids through an IV if you're dehydrated. Dehydration is when you don't have enough fluid in your body.  Insulin. It may be given through your skin as a shot or through an IV.  Electrolytes. Electrolytes are salts and minerals in your body. If you need more of these salts and minerals, you may be given them in pill form or through an IV.  Antibiotics. You may be given  these medicines if the DKA was caused by an infection.  DKA is very serious. You may need to be watched closely.  Follow these instructions at home:  Medicines  Take over-the-counter and prescription medicines only as told by your health care provider.  Keep taking insulin and other diabetes medicines as told by your provider.  If you were prescribed antibiotics, take them as told by your provider. Do not stop taking your antibiotics even if you start to feel better.  Eating and drinking  Drink enough fluid to keep your pee pale yellow.  If you can eat:  Follow your normal diet.  Drink sugar-free liquids. These include water, tea, and sugar-free soft drinks.  Eat sugar-free gelatin or ice pops.  If you can't eat, drink small amounts of liquids that have sugar in them. These liquids include fruit juice, regular soft drinks, and sherbet.  Checking ketones and blood sugar    Check your pee for ketones when you're ill and as told by your provider.  If your blood sugar is 240 mg/dL (16.1 mmol/L) or higher, check your ketones every 4 hours.  If you have moderate to high levels of ketones, call your provider.  To check your ketones:  Collect pee in a small cup.  Dip  a test strip in the pee.  Wait for it to change color.  Compare the strip to the color chart that comes with the test kit.  Check your blood sugar each day, and as often as told by your provider.  If your blood sugar is high, drink lots of fluids. This helps flush out ketones.  If your blood sugar is above your target for two tests in a row, contact your provider.  General instructions  Wear an alert bracelet or carry a card that says you have diabetes.  Do exercises as told by your provider. Do not exercise when your blood sugar is high and you have ketones in your pee.  If you get sick, call your provider and check your blood sugar every 4 hours. You may need to start treatment right away. Your body often needs extra insulin when you're sick.  Where to find  more information  American Diabetes Association (ADA): diabetes.org  Association of Diabetes Care & Education Specialists: diabeteseducator.org  Contact a health care provider if:  Your blood sugar level is higher than 240 mg/dL (16.1 mmol/L) for 2 days in a row.  Your blood sugar is high even after you take insulin.  You have a lot of ketones in your pee.  You have a fever.  You can't eat or drink without throwing up.  You've been throwing up for more than 2 hours.  You keep having symptoms of DKA.  Get help right away if:  You faint.  You have chest pain or trouble breathing.  You suddenly have trouble speaking or swallowing.  You can't stay awake or you have trouble thinking.  You are very dehydrated. Symptoms may include:  Extreme thirst.  Dry mouth.  Fast breathing.  These symptoms may be an emergency. Get help right away. Call 911.  Do not wait to see if the symptoms will go away.  Do not drive yourself to the hospital.  This information is not intended to replace advice given to you by your health care provider. Make sure you discuss any questions you have with your health care provider.  Document Revised: 05/06/2022 Document Reviewed: 05/06/2022  Elsevier Patient Education  2024 [employer].

## 2023-12-02 ENCOUNTER — Ambulatory Visit: Payer: Self-pay | Admitting: Internal Medicine

## 2023-12-08 ENCOUNTER — Ambulatory Visit
Admission: RE | Admit: 2023-12-08 | Discharge: 2023-12-08 | Disposition: A | Discharge status: Home / Self Care | Payer: Self-pay | Source: Ambulatory Visit | Attending: Internal Medicine | Admitting: Internal Medicine

## 2023-12-08 DIAGNOSIS — E876 Hypokalemia: Secondary | ICD-10-CM | POA: Insufficient documentation

## 2023-12-08 DIAGNOSIS — E1110 Type 2 diabetes mellitus with ketoacidosis without coma: Secondary | ICD-10-CM | POA: Insufficient documentation

## 2023-12-14 ENCOUNTER — Ambulatory Visit (INDEPENDENT_AMBULATORY_CARE_PROVIDER_SITE_OTHER): Admitting: Internal Medicine

## 2023-12-14 ENCOUNTER — Encounter: Payer: Self-pay | Admitting: Internal Medicine

## 2023-12-14 VITALS — BP 118/80 | Ht 72.0 in | Wt 222.1 lb

## 2023-12-14 DIAGNOSIS — E66811 Other obesity due to excess calories: Secondary | ICD-10-CM

## 2023-12-14 DIAGNOSIS — Z6830 Body mass index (BMI) 30.0-30.9, adult: Secondary | ICD-10-CM

## 2023-12-14 DIAGNOSIS — Z7984 Long term (current) use of oral hypoglycemic drugs: Secondary | ICD-10-CM

## 2023-12-14 DIAGNOSIS — E1165 Type 2 diabetes mellitus with hyperglycemia: Secondary | ICD-10-CM | POA: Diagnosis not present

## 2023-12-14 DIAGNOSIS — Z125 Encounter for screening for malignant neoplasm of prostate: Secondary | ICD-10-CM | POA: Diagnosis not present

## 2023-12-14 DIAGNOSIS — Z7985 Long-term (current) use of injectable non-insulin antidiabetic drugs: Secondary | ICD-10-CM

## 2023-12-14 DIAGNOSIS — Z0001 Encounter for general adult medical examination with abnormal findings: Secondary | ICD-10-CM | POA: Diagnosis not present

## 2023-12-14 DIAGNOSIS — E6609 Other obesity due to excess calories: Secondary | ICD-10-CM

## 2023-12-14 MED ORDER — ASPIRIN 81 MG PO TBEC: 81.0000 mg | DELAYED_RELEASE_TABLET | Freq: Every day | ORAL | Status: AC

## 2023-12-14 NOTE — Progress Notes (Signed)
 Subjective:    Patient ID: Bruce Brandt, male    DOB: 01/25/1971, 53 y.o.   MRN: [id number]  HPI  Patient presents the clinic today for his annual exam.  Flu: 11/2023 Tetanus: 03/2018 COVID: X 2 Pneumovax: Never Prevnar 20: Never Shingrix: Never PSA screening: 11/2022 Colon screening: 10/2020 Vision screening: annually, [facility]: 3 x year  Diet: He does eat meat. He consumes fruits and veggies. He does eat some fried foods. He drinks mostly water, Dt. Powerade, dt. Tea, coffee Exercise: None  Review of Systems     Past Medical History:  Diagnosis Date   Carpal tunnel syndrome, bilateral 05/18/2016   Diabetes (HCC)    Frequent headaches    Gingivitis     Current Outpatient Medications  Medication Sig Dispense Refill   baclofen (LIORESAL) 10 MG tablet Take 10 mg by mouth daily as needed (Headache).     Cholecalciferol (VITAMIN D-3) 125 MCG (5000 UT) TABS Take 2 capsules by mouth daily.     Co-Enzyme Q-10 30 MG CAPS Take 30 mg by mouth daily.     Continuous Blood Gluc Sensor (FREESTYLE LIBRE 3 SENSOR) MISC 1 Device by Does not apply route every 14 (fourteen) days. Place 1 sensor on the skin every 14 days. Use to check glucose continuously 6 each 1   EMGALITY 120 MG/ML SOAJ Inject 120 mg into the skin every 30 (thirty) days.     glucose blood (ONETOUCH ULTRA) test strip Check blood sugar up to three times a day 100 each 12   hydrocortisone  2.5 % lotion Apply topically 2 (two) times daily. 59 mL 0   imipramine  (TOFRANIL ) 50 MG tablet Take 100 mg by mouth daily.     insulin  degludec (TRESIBA FLEXTOUCH) 100 UNIT/ML FlexTouch Pen Inject 10 Units into the skin daily. 15 mL 0   Insulin  Lispro Prot & Lispro (HUMALOG MIX 75/25 KWIKPEN) (75-25) 100 UNIT/ML Kwikpen Inject 10-14 Units into the skin 2 (two) times daily before a meal. 15 mL 0   Insulin  Pen Needle (BD PEN NEEDLE NANO 2ND GEN) 32G X 4 MM MISC BD PEN NEEDLES- BRAND SPECIFIC. INJECT INSULIN  VIA INSULIN  PEN 6 X  DAILY 300 each 3   Insulin  Pen Needle (INSUPEN PEN NEEDLES) 32G X 4 MM MISC use with insulin  100 each 0   JARDIANCE  25 MG TABS tablet TAKE 1 TABLET (25 MG TOTAL) BY MOUTH DAILY. 30 tablet 2   Magnesium 100 MG TABS Take 200 mg by mouth daily.     multivitamin-iron-minerals-folic acid (CENTRUM) chewable tablet Chew 1 tablet by mouth daily.     Naproxen Sod-diphenhydrAMINE  220-25 MG TABS Take 2 tablets by mouth at bedtime as needed.     Turmeric 400 MG CAPS Take 400 mg by mouth daily.     zonisamide (ZONEGRAN) 100 MG capsule Take 300 mg by mouth daily.     No current facility-administered medications for this visit.    Allergies  Allergen Reactions   Metformin  Hcl Other (See Comments)    GI upset   Covid-19 (Mrna) Vaccine Proofreader) [Covid-19 (Mrna) Vaccine]     Bruising under great toe on both feet 1 week s/p Moderna Immunization    Family History  Problem Relation Age of Onset   Heart disease Father    Diabetes Maternal Grandmother    Diabetes Paternal Grandmother     Social History   Socioeconomic History   Marital status: Married    Spouse name: Not on file  Number of children: 2   Years of education: 12   Highest education level: High school graduate  Occupational History   Occupation: Music Therapist    Comment: Self-employed  Tobacco Use   Smoking status: Never   Smokeless tobacco: Never  Vaping Use   Vaping status: Never Used  Substance and Sexual Activity   Alcohol use: Yes    Comment: > 2 years ago had rare alcohol intake   Drug use: No   Sexual activity: Yes    Partners: Female    Comment: Married  Other Topics Concern   Not on file  Social History Narrative   Lives at home w/ his wife   Right-handed   Caffeine: three 16 oz drinks daily   Social Drivers of Corporate Investment Banker Strain: Low Risk  (12/12/2023)   Received from [facility]   Overall Financial Resource Strain (CARDIA)    Difficulty of Paying Living Expenses: Not hard at  all  Food Insecurity: No Food Insecurity (12/12/2023)   Received from [facility]   Hunger Vital Sign    Within the past 12 months, you worried that your food would run out before you got the money to buy more.: Never true    Within the past 12 months, the food you bought just didn't last and you didn't have money to get more.: Never true  Transportation Needs: No Transportation Needs (12/12/2023)   Received from [facility] - Transportation    In the past 12 months, has lack of transportation kept you from medical appointments or from getting medications?: No    Lack of Transportation (Non-Medical): No  Physical Activity: Sufficiently Active (01/03/2018)   Exercise Vital Sign    Days of Exercise per Week: 5 days    Minutes of Exercise per Session: 50 min  Stress: No Stress Concern Present (01/03/2018)   [employer] of Occupational Health - Occupational Stress Questionnaire    Feeling of Stress : Only a little  Social Connections: Socially Integrated (01/03/2018)   Social Connection and Isolation Panel    Frequency of Communication with Friends and Family: Twice a week    Frequency of Social Gatherings with Friends and Family: Twice a week    Attends Religious Services: More than 4 times per year    Active Member of [employer] or Organizations: Yes    Attends Engineer, Structural: More than 4 times per year    Marital Status: Married  Catering Manager Violence: Not At Risk (11/20/2023)   Humiliation, Afraid, Rape, and Kick questionnaire    Fear of Current or Ex-Partner: No    Emotionally Abused: No    Physically Abused: No    Sexually Abused: No     Constitutional: Patient reports intermittent headaches.  Denies fever, malaise, fatigue, or abrupt weight changes.  HEENT: Denies eye pain, eye redness, ear pain, ringing in the ears, wax buildup, runny nose, nasal congestion, bloody nose, or sore throat. Respiratory: Denies  difficulty breathing, shortness of breath, cough or sputum production.   Cardiovascular: Denies chest pain, chest tightness, palpitations or swelling in the hands or feet.  Gastrointestinal: Denies abdominal pain, bloating, constipation, diarrhea or blood in the stool.  GU: Patient reports erectile dysfunction.  Denies urgency, frequency, pain with urination, burning sensation, blood in urine, odor or discharge. Musculoskeletal: Patient reports intermittent right shoulder pain.  Denies decrease in range of motion, difficulty with gait, muscle pain or joint swelling.  Skin: Denies redness, rashes, lesions or ulcercations.  Neurological: Pt reports paresthesia of lower extremities. Denies dizziness, difficulty with memory, difficulty with speech or problems with balance and coordination.  Psych: Denies anxiety, depression, SI/HI.  No other specific complaints in a complete review of systems (except as listed in HPI above).  Objective:   Physical Exam  BP 118/80 (BP Location: Left Arm, Patient Position: Sitting)   Ht 6' (1.829 m)   Wt 222 lb 2 oz (100.8 kg)   BMI 30.13 kg/m    Wt Readings from Last 3 Encounters:  12/01/23 215 lb 12.8 oz (97.9 kg)  11/20/23 205 lb 4 oz (93.1 kg)  06/10/23 212 lb 12.8 oz (96.5 kg)    General: Appears his stated age, obese, in NAD. Skin: Warm, dry and intact. No  ulcerations noted. HEENT: Head: normal shape and size; Eyes: sclera white, no icterus, conjunctiva pink, PERRLA and EOMs intact;  Neck:  Neck supple, trachea midline. No masses, lumps or thyromegaly present.  Cardiovascular: Normal rate and rhythm. S1,S2 noted.  No murmur, rubs or gallops noted. No JVD or BLE edema. No carotid bruits noted. Pulmonary/Chest: Normal effort and positive vesicular breath sounds. No respiratory distress. No wheezes, rales or ronchi noted.  Abdomen: Soft and nontender. Normal bowel sounds.  Musculoskeletal: Strength 5/5 BUE/BLE. No difficulty with gait.  Neurological:  Alert and oriented. Cranial nerves II-XII grossly intact. Coordination normal.  Psychiatric: Mood and affect normal. Behavior is normal. Judgment and thought content normal.    BMET    Component Value Date/Time   NA 139 12/01/2023 0951   K 5.0 12/01/2023 0951   CL 107 12/01/2023 0951   CO2 24 12/01/2023 0951   GLUCOSE 184 (H) 12/01/2023 0951   BUN 18 12/01/2023 0951   CREATININE 0.60 (L) 12/01/2023 0951   CALCIUM  9.2 12/01/2023 0951   GFRNONAA >60 11/24/2023 0402   GFRNONAA 119 05/10/2019 0839   GFRAA 138 05/10/2019 0839    Lipid Panel     Component Value Date/Time   CHOL 165 09/14/2022 0000   TRIG 148 03/24/2023 0000   HDL 53 01/22/2022 0821   CHOLHDL 4.0 01/22/2022 0821   LDLCALC 179 03/24/2023 0000   LDLCALC 130 (H) 01/22/2022 0821    CBC    Component Value Date/Time   WBC 19.3 (H) 11/20/2023 0103   RBC 5.81 11/20/2023 0103   HGB 17.0 11/20/2023 0103   HCT 54.7 (H) 11/20/2023 0103   PLT 242 11/20/2023 0103   MCV 94.1 11/20/2023 0103   MCH 29.3 11/20/2023 0103   MCHC 31.1 11/20/2023 0103   RDW 12.8 11/20/2023 0103   LYMPHSABS 1.2 11/20/2023 0103   MONOABS 1.3 (H) 11/20/2023 0103   EOSABS 0.0 11/20/2023 0103   BASOSABS 0.1 11/20/2023 0103    Hgb A1C Lab Results  Component Value Date   HGBA1C 12.1 (H) 11/20/2023           Assessment & Plan:   Preventative health maintenance:  Flu shot UTD Tetanus UTD Encouraged him to get his COVID booster Pneumovax and Prevnar 20 declined Discussed Shingrix vaccine, he will check coverage with his insurance company and schedule visit he would like to have this done Colon screening UTD Encouraged him to consume a balanced diet and exercise regimen Advised him to see an eye doctor and dentist annually Recent labs reviewed in care everywhere, lipid profile and PSA today  RTC in 6 months, follow-up chronic conditions Angeline Laura, NP

## 2023-12-14 NOTE — Assessment & Plan Note (Signed)
 Encouraged diet and exercise for weight loss ?

## 2023-12-14 NOTE — Patient Instructions (Signed)
 Health Maintenance, Male  Adopting a healthy lifestyle and getting preventive care are important in promoting health and wellness. Ask your health care provider about:  The right schedule for you to have regular tests and exams.  Things you can do on your own to prevent diseases and keep yourself healthy.  What should I know about diet, weight, and exercise?  Eat a healthy diet    Eat a diet that includes plenty of vegetables, fruits, low-fat dairy products, and lean protein.  Do not eat a lot of foods that are high in solid fats, added sugars, or sodium.  Maintain a healthy weight  Body mass index (BMI) is a measurement that can be used to identify possible weight problems. It estimates body fat based on height and weight. Your health care provider can help determine your BMI and help you achieve or maintain a healthy weight.  Get regular exercise  Get regular exercise. This is one of the most important things you can do for your health. Most adults should:  Exercise for at least 150 minutes each week. The exercise should increase your heart rate and make you sweat (moderate-intensity exercise).  Do strengthening exercises at least twice a week. This is in addition to the moderate-intensity exercise.  Spend less time sitting. Even light physical activity can be beneficial.  Watch cholesterol and blood lipids  Have your blood tested for lipids and cholesterol at 53 years of age, then have this test every 5 years.  You may need to have your cholesterol levels checked more often if:  Your lipid or cholesterol levels are high.  You are older than 53 years of age.  You are at high risk for heart disease.  What should I know about cancer screening?  Many types of cancers can be detected early and may often be prevented. Depending on your health history and family history, you may need to have cancer screening at various ages. This may include screening for:  Colorectal cancer.  Prostate cancer.  Skin cancer.  Lung  cancer.  What should I know about heart disease, diabetes, and high blood pressure?  Blood pressure and heart disease  High blood pressure causes heart disease and increases the risk of stroke. This is more likely to develop in people who have high blood pressure readings or are overweight.  Talk with your health care provider about your target blood pressure readings.  Have your blood pressure checked:  Every 3-5 years if you are 24-52 years of age.  Every year if you are 3 years old or older.  If you are between the ages of 60 and 72 and are a current or former smoker, ask your health care provider if you should have a one-time screening for abdominal aortic aneurysm (AAA).  Diabetes  Have regular diabetes screenings. This checks your fasting blood sugar level. Have the screening done:  Once every three years after age 66 if you are at a normal weight and have a low risk for diabetes.  More often and at a younger age if you are overweight or have a high risk for diabetes.  What should I know about preventing infection?  Hepatitis B  If you have a higher risk for hepatitis B, you should be screened for this virus. Talk with your health care provider to find out if you are at risk for hepatitis B infection.  Hepatitis C  Blood testing is recommended for:  Everyone born from [birth year] through 1965.  Anyone  with known risk factors for hepatitis C.  Sexually transmitted infections (STIs)  You should be screened each year for STIs, including gonorrhea and chlamydia, if:  You are sexually active and are younger than 53 years of age.  You are older than 53 years of age and your health care provider tells you that you are at risk for this type of infection.  Your sexual activity has changed since you were last screened, and you are at increased risk for chlamydia or gonorrhea. Ask your health care provider if you are at risk.  Ask your health care provider about whether you are at high risk for HIV. Your health care provider  may recommend a prescription medicine to help prevent HIV infection. If you choose to take medicine to prevent HIV, you should first get tested for HIV. You should then be tested every 3 months for as long as you are taking the medicine.  Follow these instructions at home:  Alcohol use  Do not drink alcohol if your health care provider tells you not to drink.  If you drink alcohol:  Limit how much you have to 0-2 drinks a day.  Know how much alcohol is in your drink. In the [place] one drink equals one 12 oz bottle of beer (355 mL), one 5 oz glass of wine (148 mL), or one 1 oz glass of hard liquor (44 mL).  Lifestyle  Do not use any products that contain nicotine or tobacco. These products include cigarettes, chewing tobacco, and vaping devices, such as e-cigarettes. If you need help quitting, ask your health care provider.  Do not use street drugs.  Do not share needles.  Ask your health care provider for help if you need support or information about quitting drugs.  General instructions  Schedule regular health, dental, and eye exams.  Stay current with your vaccines.  Tell your health care provider if:  You often feel depressed.  You have ever been abused or do not feel safe at home.  Summary  Adopting a healthy lifestyle and getting preventive care are important in promoting health and wellness.  Follow your health care provider's instructions about healthy diet, exercising, and getting tested or screened for diseases.  Follow your health care provider's instructions on monitoring your cholesterol and blood pressure.  This information is not intended to replace advice given to you by your health care provider. Make sure you discuss any questions you have with your health care provider.  Document Revised: 06/16/2020 Document Reviewed: 06/16/2020  Elsevier Patient Education  2024 [employer].

## 2023-12-15 ENCOUNTER — Ambulatory Visit: Payer: Self-pay | Admitting: Internal Medicine

## 2023-12-15 LAB — PSA: PSA: 0.76 ng/mL (ref <=4.00)

## 2023-12-15 LAB — LIPID PANEL
Cholesterol: 201 mg/dL — ABNORMAL HIGH (ref <200)
HDL: 70 mg/dL (ref >=40)
LDL Cholesterol (Calc): 115 mg/dL — ABNORMAL HIGH
Non-HDL Cholesterol (Calc): 131 mg/dL — ABNORMAL HIGH (ref <130)
Total CHOL/HDL Ratio: 2.9 (calc) (ref <5.0)
Triglycerides: 72 mg/dL (ref <150)

## 2023-12-22 MED ORDER — ATORVASTATIN CALCIUM 40 MG PO TABS: 40.0000 mg | ORAL_TABLET | Freq: Every day | ORAL | 3 refills | Status: AC

## 2023-12-23 ENCOUNTER — Encounter: Admitting: Internal Medicine

## 2024-02-18 ENCOUNTER — Other Ambulatory Visit: Payer: Self-pay | Admitting: Internal Medicine

## 2024-02-20 NOTE — Telephone Encounter (Signed)
 Requested Prescriptions  Pending Prescriptions Disp Refills   JARDIANCE  25 MG TABS tablet [Pharmacy Med Name: JARDIANCE  25 MG TABLET] 90 tablet 0    Sig: TAKE 1 TABLET (25 MG TOTAL) BY MOUTH DAILY.     Endocrinology:  Diabetes - SGLT2 Inhibitors Failed - 02/20/2024  2:31 PM      Failed - Cr in normal range and within 360 days    Creat  Date Value Ref Range Status  12/01/2023 0.60 (L) 0.70 - 1.30 mg/dL Final   Creatinine, POC  Date Value Ref Range Status  03/25/2023 116.5 mg/dL Final   Creatinine, Urine  Date Value Ref Range Status  09/15/2022 116.5  Final    Comment:    [facility]         Failed - HBA1C is between 0 and 7.9 and within 180 days    Hemoglobin A1C  Date Value Ref Range Status  09/14/2022 7.9  Final   Hgb A1c MFr Bld  Date Value Ref Range Status  11/20/2023 12.1 (H) 4.8 - 5.6 % Final    Comment:    (NOTE) Diagnosis of Diabetes The following HbA1c ranges recommended by the American Diabetes Association (ADA) may be used as an aid in the diagnosis of diabetes mellitus.  Hemoglobin             Suggested A1C NGSP%              Diagnosis  <5.7                   Non Diabetic  5.7-6.4                Pre-Diabetic  >6.4                   Diabetic  <7.0                   Glycemic control for                       adults with diabetes.     A1c  Date Value Ref Range Status  03/25/2023 12.0  Final         Passed - eGFR in normal range and within 360 days    GFR, Est African American  Date Value Ref Range Status  05/10/2019 138 > OR = 60 mL/min/1.52m2 Final   GFR, Est Non African American  Date Value Ref Range Status  05/10/2019 119 > OR = 60 mL/min/1.70m2 Final   GFR, Estimated  Date Value Ref Range Status  11/24/2023 >60 >60 mL/min Final    Comment:    (NOTE) Calculated using the CKD-EPI Creatinine Equation (2021)    eGFR  Date Value Ref Range Status  12/01/2023 115 > OR = 60 mL/min/1.2m2 Final         Passed - Valid encounter  within last 6 months    Recent Outpatient Visits           2 months ago Encounter for general adult medical examination with abnormal findings   [place] [facility] [place], [place] W, NP   2 months ago Diabetic ketoacidosis without coma associated with type 2 diabetes mellitus [facility])   [facility] [place], Angeline ORN, NP   8 months ago Aortic atherosclerosis   [place] [facility] [place], Angeline ORN, [place]

## 2024-06-19 ENCOUNTER — Ambulatory Visit: Admitting: Internal Medicine
